# Patient Record
Sex: Male | Born: 1935 | Race: White | Hispanic: No | Smoking: Former smoker
Health system: Southern US, Community
[De-identification: ages and names within clinical notes are randomized; demographics above are authoritative.]

## PROBLEM LIST (undated history)

## (undated) DIAGNOSIS — E785 Hyperlipidemia, unspecified: Secondary | ICD-10-CM

## (undated) DIAGNOSIS — E119 Type 2 diabetes mellitus without complications: Secondary | ICD-10-CM

## (undated) DIAGNOSIS — G629 Polyneuropathy, unspecified: Secondary | ICD-10-CM

## (undated) DIAGNOSIS — I25118 Atherosclerotic heart disease of native coronary artery with other forms of angina pectoris: Secondary | ICD-10-CM

## (undated) DIAGNOSIS — Z972 Presence of dental prosthetic device (complete) (partial): Secondary | ICD-10-CM

## (undated) DIAGNOSIS — Z974 Presence of external hearing-aid: Secondary | ICD-10-CM

## (undated) DIAGNOSIS — I5189 Other ill-defined heart diseases: Secondary | ICD-10-CM

## (undated) DIAGNOSIS — I2510 Atherosclerotic heart disease of native coronary artery without angina pectoris: Secondary | ICD-10-CM

## (undated) DIAGNOSIS — I10 Essential (primary) hypertension: Secondary | ICD-10-CM

## (undated) DIAGNOSIS — C61 Malignant neoplasm of prostate: Secondary | ICD-10-CM

## (undated) HISTORY — DX: Type 2 diabetes mellitus without complications: E11.9

## (undated) HISTORY — DX: Essential (primary) hypertension: I10

## (undated) HISTORY — DX: Hyperlipidemia, unspecified: E78.5

## (undated) HISTORY — PX: COLONOSCOPY: SHX174

## (undated) HISTORY — DX: Malignant neoplasm of prostate: C61

---

## 2006-09-09 ENCOUNTER — Ambulatory Visit: Payer: Self-pay | Admitting: Gastroenterology

## 2011-05-29 DIAGNOSIS — J019 Acute sinusitis, unspecified: Secondary | ICD-10-CM | POA: Diagnosis not present

## 2011-05-29 DIAGNOSIS — R05 Cough: Secondary | ICD-10-CM | POA: Diagnosis not present

## 2011-05-29 DIAGNOSIS — R51 Headache: Secondary | ICD-10-CM | POA: Diagnosis not present

## 2011-05-29 DIAGNOSIS — R911 Solitary pulmonary nodule: Secondary | ICD-10-CM | POA: Diagnosis not present

## 2011-06-26 DIAGNOSIS — C61 Malignant neoplasm of prostate: Secondary | ICD-10-CM | POA: Diagnosis not present

## 2011-06-26 DIAGNOSIS — M9981 Other biomechanical lesions of cervical region: Secondary | ICD-10-CM | POA: Diagnosis not present

## 2011-06-26 DIAGNOSIS — D400 Neoplasm of uncertain behavior of prostate: Secondary | ICD-10-CM | POA: Diagnosis not present

## 2011-06-26 DIAGNOSIS — M5030 Other cervical disc degeneration, unspecified cervical region: Secondary | ICD-10-CM | POA: Diagnosis not present

## 2011-06-26 DIAGNOSIS — N529 Male erectile dysfunction, unspecified: Secondary | ICD-10-CM | POA: Diagnosis not present

## 2011-06-28 DIAGNOSIS — M5030 Other cervical disc degeneration, unspecified cervical region: Secondary | ICD-10-CM | POA: Diagnosis not present

## 2011-06-28 DIAGNOSIS — M9981 Other biomechanical lesions of cervical region: Secondary | ICD-10-CM | POA: Diagnosis not present

## 2011-07-01 DIAGNOSIS — M9981 Other biomechanical lesions of cervical region: Secondary | ICD-10-CM | POA: Diagnosis not present

## 2011-07-01 DIAGNOSIS — M5030 Other cervical disc degeneration, unspecified cervical region: Secondary | ICD-10-CM | POA: Diagnosis not present

## 2011-07-04 DIAGNOSIS — M9981 Other biomechanical lesions of cervical region: Secondary | ICD-10-CM | POA: Diagnosis not present

## 2011-07-04 DIAGNOSIS — M5030 Other cervical disc degeneration, unspecified cervical region: Secondary | ICD-10-CM | POA: Diagnosis not present

## 2011-07-08 DIAGNOSIS — M9981 Other biomechanical lesions of cervical region: Secondary | ICD-10-CM | POA: Diagnosis not present

## 2011-07-08 DIAGNOSIS — M5030 Other cervical disc degeneration, unspecified cervical region: Secondary | ICD-10-CM | POA: Diagnosis not present

## 2011-07-11 DIAGNOSIS — M9981 Other biomechanical lesions of cervical region: Secondary | ICD-10-CM | POA: Diagnosis not present

## 2011-07-11 DIAGNOSIS — M5030 Other cervical disc degeneration, unspecified cervical region: Secondary | ICD-10-CM | POA: Diagnosis not present

## 2012-01-29 DIAGNOSIS — C61 Malignant neoplasm of prostate: Secondary | ICD-10-CM | POA: Diagnosis not present

## 2012-01-29 DIAGNOSIS — I10 Essential (primary) hypertension: Secondary | ICD-10-CM | POA: Diagnosis not present

## 2012-01-29 DIAGNOSIS — E119 Type 2 diabetes mellitus without complications: Secondary | ICD-10-CM | POA: Diagnosis not present

## 2012-01-29 DIAGNOSIS — E785 Hyperlipidemia, unspecified: Secondary | ICD-10-CM | POA: Diagnosis not present

## 2012-01-30 DIAGNOSIS — C61 Malignant neoplasm of prostate: Secondary | ICD-10-CM | POA: Diagnosis not present

## 2012-01-30 DIAGNOSIS — E785 Hyperlipidemia, unspecified: Secondary | ICD-10-CM | POA: Diagnosis not present

## 2012-01-30 DIAGNOSIS — E119 Type 2 diabetes mellitus without complications: Secondary | ICD-10-CM | POA: Diagnosis not present

## 2012-05-29 DIAGNOSIS — E119 Type 2 diabetes mellitus without complications: Secondary | ICD-10-CM | POA: Diagnosis not present

## 2012-05-29 DIAGNOSIS — J329 Chronic sinusitis, unspecified: Secondary | ICD-10-CM | POA: Diagnosis not present

## 2012-06-03 DIAGNOSIS — E7800 Pure hypercholesterolemia, unspecified: Secondary | ICD-10-CM | POA: Diagnosis not present

## 2012-06-03 DIAGNOSIS — I10 Essential (primary) hypertension: Secondary | ICD-10-CM | POA: Diagnosis not present

## 2012-06-03 DIAGNOSIS — Z23 Encounter for immunization: Secondary | ICD-10-CM | POA: Diagnosis not present

## 2012-06-03 DIAGNOSIS — C61 Malignant neoplasm of prostate: Secondary | ICD-10-CM | POA: Diagnosis not present

## 2012-06-03 DIAGNOSIS — E119 Type 2 diabetes mellitus without complications: Secondary | ICD-10-CM | POA: Diagnosis not present

## 2012-06-04 DIAGNOSIS — I10 Essential (primary) hypertension: Secondary | ICD-10-CM | POA: Diagnosis not present

## 2012-06-04 DIAGNOSIS — E7800 Pure hypercholesterolemia, unspecified: Secondary | ICD-10-CM | POA: Diagnosis not present

## 2012-06-04 DIAGNOSIS — E119 Type 2 diabetes mellitus without complications: Secondary | ICD-10-CM | POA: Diagnosis not present

## 2012-07-03 DIAGNOSIS — E1149 Type 2 diabetes mellitus with other diabetic neurological complication: Secondary | ICD-10-CM | POA: Diagnosis not present

## 2012-07-03 DIAGNOSIS — E119 Type 2 diabetes mellitus without complications: Secondary | ICD-10-CM | POA: Diagnosis not present

## 2012-07-03 DIAGNOSIS — I10 Essential (primary) hypertension: Secondary | ICD-10-CM | POA: Diagnosis not present

## 2012-07-03 DIAGNOSIS — E7800 Pure hypercholesterolemia, unspecified: Secondary | ICD-10-CM | POA: Diagnosis not present

## 2012-07-27 DIAGNOSIS — I10 Essential (primary) hypertension: Secondary | ICD-10-CM | POA: Diagnosis not present

## 2012-08-03 DIAGNOSIS — I10 Essential (primary) hypertension: Secondary | ICD-10-CM | POA: Diagnosis not present

## 2012-08-03 DIAGNOSIS — E119 Type 2 diabetes mellitus without complications: Secondary | ICD-10-CM | POA: Diagnosis not present

## 2012-08-19 DIAGNOSIS — R351 Nocturia: Secondary | ICD-10-CM | POA: Diagnosis not present

## 2012-08-19 DIAGNOSIS — D400 Neoplasm of uncertain behavior of prostate: Secondary | ICD-10-CM | POA: Diagnosis not present

## 2012-08-19 DIAGNOSIS — C61 Malignant neoplasm of prostate: Secondary | ICD-10-CM | POA: Diagnosis not present

## 2012-10-25 LAB — HM DIABETES EYE EXAM

## 2012-11-18 DIAGNOSIS — I10 Essential (primary) hypertension: Secondary | ICD-10-CM | POA: Diagnosis not present

## 2012-11-18 DIAGNOSIS — E7800 Pure hypercholesterolemia, unspecified: Secondary | ICD-10-CM | POA: Diagnosis not present

## 2012-11-18 DIAGNOSIS — E119 Type 2 diabetes mellitus without complications: Secondary | ICD-10-CM | POA: Diagnosis not present

## 2012-11-18 DIAGNOSIS — E1149 Type 2 diabetes mellitus with other diabetic neurological complication: Secondary | ICD-10-CM | POA: Diagnosis not present

## 2012-12-17 DIAGNOSIS — E119 Type 2 diabetes mellitus without complications: Secondary | ICD-10-CM | POA: Diagnosis not present

## 2012-12-17 DIAGNOSIS — E7800 Pure hypercholesterolemia, unspecified: Secondary | ICD-10-CM | POA: Diagnosis not present

## 2012-12-17 DIAGNOSIS — I10 Essential (primary) hypertension: Secondary | ICD-10-CM | POA: Diagnosis not present

## 2012-12-17 DIAGNOSIS — E1149 Type 2 diabetes mellitus with other diabetic neurological complication: Secondary | ICD-10-CM | POA: Diagnosis not present

## 2013-02-17 DIAGNOSIS — E1142 Type 2 diabetes mellitus with diabetic polyneuropathy: Secondary | ICD-10-CM | POA: Diagnosis not present

## 2013-02-17 DIAGNOSIS — Z23 Encounter for immunization: Secondary | ICD-10-CM | POA: Diagnosis not present

## 2013-02-17 DIAGNOSIS — E1149 Type 2 diabetes mellitus with other diabetic neurological complication: Secondary | ICD-10-CM | POA: Diagnosis not present

## 2013-02-17 DIAGNOSIS — E119 Type 2 diabetes mellitus without complications: Secondary | ICD-10-CM | POA: Diagnosis not present

## 2013-02-24 DIAGNOSIS — E119 Type 2 diabetes mellitus without complications: Secondary | ICD-10-CM | POA: Diagnosis not present

## 2013-02-24 DIAGNOSIS — E7800 Pure hypercholesterolemia, unspecified: Secondary | ICD-10-CM | POA: Diagnosis not present

## 2013-05-07 DIAGNOSIS — E1149 Type 2 diabetes mellitus with other diabetic neurological complication: Secondary | ICD-10-CM | POA: Diagnosis not present

## 2013-05-07 DIAGNOSIS — E7800 Pure hypercholesterolemia, unspecified: Secondary | ICD-10-CM | POA: Diagnosis not present

## 2013-05-07 DIAGNOSIS — E1142 Type 2 diabetes mellitus with diabetic polyneuropathy: Secondary | ICD-10-CM | POA: Diagnosis not present

## 2013-05-07 DIAGNOSIS — I10 Essential (primary) hypertension: Secondary | ICD-10-CM | POA: Diagnosis not present

## 2013-05-07 DIAGNOSIS — Z23 Encounter for immunization: Secondary | ICD-10-CM | POA: Diagnosis not present

## 2013-05-10 DIAGNOSIS — IMO0002 Reserved for concepts with insufficient information to code with codable children: Secondary | ICD-10-CM | POA: Diagnosis not present

## 2013-06-25 DIAGNOSIS — E1142 Type 2 diabetes mellitus with diabetic polyneuropathy: Secondary | ICD-10-CM | POA: Diagnosis not present

## 2013-06-25 DIAGNOSIS — I10 Essential (primary) hypertension: Secondary | ICD-10-CM | POA: Diagnosis not present

## 2013-06-25 DIAGNOSIS — E1149 Type 2 diabetes mellitus with other diabetic neurological complication: Secondary | ICD-10-CM | POA: Diagnosis not present

## 2013-06-25 DIAGNOSIS — J209 Acute bronchitis, unspecified: Secondary | ICD-10-CM | POA: Diagnosis not present

## 2013-08-06 DIAGNOSIS — I10 Essential (primary) hypertension: Secondary | ICD-10-CM | POA: Diagnosis not present

## 2013-08-06 DIAGNOSIS — E119 Type 2 diabetes mellitus without complications: Secondary | ICD-10-CM | POA: Diagnosis not present

## 2013-08-06 DIAGNOSIS — E1149 Type 2 diabetes mellitus with other diabetic neurological complication: Secondary | ICD-10-CM | POA: Diagnosis not present

## 2013-08-06 DIAGNOSIS — E1142 Type 2 diabetes mellitus with diabetic polyneuropathy: Secondary | ICD-10-CM | POA: Diagnosis not present

## 2013-08-17 DIAGNOSIS — N529 Male erectile dysfunction, unspecified: Secondary | ICD-10-CM | POA: Diagnosis not present

## 2013-08-17 DIAGNOSIS — C61 Malignant neoplasm of prostate: Secondary | ICD-10-CM | POA: Diagnosis not present

## 2013-08-17 DIAGNOSIS — D400 Neoplasm of uncertain behavior of prostate: Secondary | ICD-10-CM | POA: Diagnosis not present

## 2013-09-13 DIAGNOSIS — I10 Essential (primary) hypertension: Secondary | ICD-10-CM | POA: Diagnosis not present

## 2013-09-13 DIAGNOSIS — E119 Type 2 diabetes mellitus without complications: Secondary | ICD-10-CM | POA: Diagnosis not present

## 2013-09-13 DIAGNOSIS — J3489 Other specified disorders of nose and nasal sinuses: Secondary | ICD-10-CM | POA: Diagnosis not present

## 2013-09-13 DIAGNOSIS — E1149 Type 2 diabetes mellitus with other diabetic neurological complication: Secondary | ICD-10-CM | POA: Diagnosis not present

## 2013-11-16 DIAGNOSIS — E1149 Type 2 diabetes mellitus with other diabetic neurological complication: Secondary | ICD-10-CM | POA: Diagnosis not present

## 2013-11-16 DIAGNOSIS — I10 Essential (primary) hypertension: Secondary | ICD-10-CM | POA: Diagnosis not present

## 2013-11-16 DIAGNOSIS — E7800 Pure hypercholesterolemia, unspecified: Secondary | ICD-10-CM | POA: Diagnosis not present

## 2013-11-16 DIAGNOSIS — E119 Type 2 diabetes mellitus without complications: Secondary | ICD-10-CM | POA: Diagnosis not present

## 2014-03-01 DIAGNOSIS — Z23 Encounter for immunization: Secondary | ICD-10-CM | POA: Diagnosis not present

## 2014-03-09 DIAGNOSIS — E119 Type 2 diabetes mellitus without complications: Secondary | ICD-10-CM | POA: Diagnosis not present

## 2014-03-09 DIAGNOSIS — E780 Pure hypercholesterolemia: Secondary | ICD-10-CM | POA: Diagnosis not present

## 2014-03-09 DIAGNOSIS — E1140 Type 2 diabetes mellitus with diabetic neuropathy, unspecified: Secondary | ICD-10-CM | POA: Diagnosis not present

## 2014-03-22 DIAGNOSIS — E119 Type 2 diabetes mellitus without complications: Secondary | ICD-10-CM | POA: Diagnosis not present

## 2014-03-22 DIAGNOSIS — E780 Pure hypercholesterolemia: Secondary | ICD-10-CM | POA: Diagnosis not present

## 2014-03-22 DIAGNOSIS — E1143 Type 2 diabetes mellitus with diabetic autonomic (poly)neuropathy: Secondary | ICD-10-CM | POA: Diagnosis not present

## 2014-03-22 DIAGNOSIS — I10 Essential (primary) hypertension: Secondary | ICD-10-CM | POA: Diagnosis not present

## 2014-04-20 DIAGNOSIS — J329 Chronic sinusitis, unspecified: Secondary | ICD-10-CM | POA: Diagnosis not present

## 2014-04-27 DIAGNOSIS — R110 Nausea: Secondary | ICD-10-CM | POA: Diagnosis not present

## 2014-04-27 DIAGNOSIS — K521 Toxic gastroenteritis and colitis: Secondary | ICD-10-CM | POA: Diagnosis not present

## 2014-04-27 DIAGNOSIS — J329 Chronic sinusitis, unspecified: Secondary | ICD-10-CM | POA: Diagnosis not present

## 2014-06-24 DIAGNOSIS — Z1389 Encounter for screening for other disorder: Secondary | ICD-10-CM | POA: Diagnosis not present

## 2014-06-24 DIAGNOSIS — Z23 Encounter for immunization: Secondary | ICD-10-CM | POA: Diagnosis not present

## 2014-06-24 DIAGNOSIS — E119 Type 2 diabetes mellitus without complications: Secondary | ICD-10-CM | POA: Diagnosis not present

## 2014-06-24 DIAGNOSIS — E780 Pure hypercholesterolemia: Secondary | ICD-10-CM | POA: Diagnosis not present

## 2014-06-24 DIAGNOSIS — E1142 Type 2 diabetes mellitus with diabetic polyneuropathy: Secondary | ICD-10-CM | POA: Diagnosis not present

## 2014-06-24 DIAGNOSIS — I10 Essential (primary) hypertension: Secondary | ICD-10-CM | POA: Diagnosis not present

## 2014-06-24 DIAGNOSIS — Z9181 History of falling: Secondary | ICD-10-CM | POA: Diagnosis not present

## 2014-06-24 DIAGNOSIS — R0981 Nasal congestion: Secondary | ICD-10-CM | POA: Diagnosis not present

## 2014-08-01 DIAGNOSIS — E780 Pure hypercholesterolemia: Secondary | ICD-10-CM | POA: Diagnosis not present

## 2014-08-01 DIAGNOSIS — I10 Essential (primary) hypertension: Secondary | ICD-10-CM | POA: Diagnosis not present

## 2014-08-01 DIAGNOSIS — J309 Allergic rhinitis, unspecified: Secondary | ICD-10-CM | POA: Diagnosis not present

## 2014-08-01 DIAGNOSIS — E119 Type 2 diabetes mellitus without complications: Secondary | ICD-10-CM | POA: Diagnosis not present

## 2014-08-01 DIAGNOSIS — E1143 Type 2 diabetes mellitus with diabetic autonomic (poly)neuropathy: Secondary | ICD-10-CM | POA: Diagnosis not present

## 2014-10-17 DIAGNOSIS — M1990 Unspecified osteoarthritis, unspecified site: Secondary | ICD-10-CM | POA: Diagnosis not present

## 2014-10-17 DIAGNOSIS — E780 Pure hypercholesterolemia: Secondary | ICD-10-CM | POA: Diagnosis not present

## 2014-10-17 DIAGNOSIS — I10 Essential (primary) hypertension: Secondary | ICD-10-CM | POA: Diagnosis not present

## 2014-10-17 DIAGNOSIS — E119 Type 2 diabetes mellitus without complications: Secondary | ICD-10-CM | POA: Diagnosis not present

## 2014-10-17 DIAGNOSIS — E1142 Type 2 diabetes mellitus with diabetic polyneuropathy: Secondary | ICD-10-CM | POA: Diagnosis not present

## 2014-12-26 DIAGNOSIS — H35373 Puckering of macula, bilateral: Secondary | ICD-10-CM | POA: Diagnosis not present

## 2014-12-26 DIAGNOSIS — H2513 Age-related nuclear cataract, bilateral: Secondary | ICD-10-CM | POA: Diagnosis not present

## 2015-01-19 ENCOUNTER — Encounter: Payer: Self-pay | Admitting: Family Medicine

## 2015-01-19 ENCOUNTER — Ambulatory Visit (INDEPENDENT_AMBULATORY_CARE_PROVIDER_SITE_OTHER): Payer: Medicare Other | Admitting: Family Medicine

## 2015-01-19 ENCOUNTER — Other Ambulatory Visit: Payer: Self-pay | Admitting: *Deleted

## 2015-01-19 VITALS — BP 131/71 | HR 59 | Temp 98.1°F | Resp 16 | Ht 70.0 in | Wt 170.8 lb

## 2015-01-19 DIAGNOSIS — E1169 Type 2 diabetes mellitus with other specified complication: Secondary | ICD-10-CM | POA: Diagnosis not present

## 2015-01-19 DIAGNOSIS — Z8546 Personal history of malignant neoplasm of prostate: Secondary | ICD-10-CM | POA: Insufficient documentation

## 2015-01-19 DIAGNOSIS — R351 Nocturia: Secondary | ICD-10-CM | POA: Diagnosis not present

## 2015-01-19 DIAGNOSIS — N183 Chronic kidney disease, stage 3 (moderate): Secondary | ICD-10-CM

## 2015-01-19 DIAGNOSIS — D485 Neoplasm of uncertain behavior of skin: Secondary | ICD-10-CM | POA: Diagnosis not present

## 2015-01-19 DIAGNOSIS — I129 Hypertensive chronic kidney disease with stage 1 through stage 4 chronic kidney disease, or unspecified chronic kidney disease: Secondary | ICD-10-CM | POA: Diagnosis not present

## 2015-01-19 DIAGNOSIS — E785 Hyperlipidemia, unspecified: Secondary | ICD-10-CM

## 2015-01-19 DIAGNOSIS — E1142 Type 2 diabetes mellitus with diabetic polyneuropathy: Secondary | ICD-10-CM | POA: Diagnosis not present

## 2015-01-19 LAB — POCT GLYCOSYLATED HEMOGLOBIN (HGB A1C): Hemoglobin A1C: 6.5

## 2015-01-19 MED ORDER — CAPSAICIN 0.025 % EX CREA: TOPICAL_CREAM | Freq: Two times a day (BID) | CUTANEOUS | Status: DC

## 2015-01-19 MED ORDER — ALPHA-LIPOIC ACID 600 MG PO CAPS: 1.0000 | ORAL_CAPSULE | Freq: Every day | ORAL | Status: DC

## 2015-01-19 NOTE — Progress Notes (Signed)
Subjective:    Patient ID: [name], male    DOB: 01-22-1934, 81 y.o.   MRN: [id number]  HPI: [name] is a 81 y.o. male presenting on 01/19/2015 for Diabetes; Hypertension; and Hyperlipidemia   Diabetes He has type 2 diabetes mellitus. His disease course has been stable. There are no hypoglycemic associated symptoms. Pertinent negatives for hypoglycemia include no headaches. Pertinent negatives for diabetes include no blurred vision, no chest pain, no fatigue, no foot paresthesias, no polydipsia, no polyphagia, no polyuria and no weakness. Diabetic complications include peripheral neuropathy. Current diabetic treatment includes oral agent (monotherapy) and diet. His weight is stable. His overall blood glucose range is 110-130 mg/dl.  Hypertension This is a chronic problem. The current episode started more than 1 year ago. The problem has been gradually improving since onset. The problem is controlled. Pertinent negatives include no blurred vision, chest pain, headaches, palpitations, peripheral edema or shortness of breath. Risk factors for coronary artery disease include diabetes mellitus. Past treatments include ACE inhibitors. The current treatment provides moderate improvement.  Hyperlipidemia This is a chronic problem. Recent lipid tests were reviewed and are high. Exacerbating diseases include diabetes. Pertinent negatives include no chest pain or shortness of breath. Current antihyperlipidemic treatment includes ezetimibe.    Pt also has concerns about his prostate. Treated for prostate cancer several years ago. He has noticed some nocturia and hesitancy. He has previously Dr. Yves Dill for neurology.  Diabetic peripheral neuropathy- toes numb all the day.  Pt reports he feels he doesn't walk as good with numbness in toes. Gabapentin has not made an appreciable difference Pt had eye exam with Dr. Ellin Mayhew on June- no retinopathy but cataracts. He will see Dr. Thomasene Ripple in  October for cataract surgery.   Past Medical History  Diagnosis Date  . Diabetes mellitus without complication   . Hyperlipidemia   . Hypertension   . Prostate cancer     No current outpatient prescriptions on file prior to visit.   No current facility-administered medications on file prior to visit.    Review of Systems  Constitutional: Negative for fever, chills and fatigue.  HENT: Negative.   Eyes: Negative for blurred vision and visual disturbance.  Respiratory: Negative for chest tightness, shortness of breath and wheezing.   Cardiovascular: Negative for chest pain and palpitations.  Gastrointestinal: Negative.   Endocrine: Negative for cold intolerance, heat intolerance, polydipsia, polyphagia and polyuria.  Genitourinary: Positive for difficulty urinating.  Neurological: Negative for weakness, light-headedness, numbness and headaches.  Psychiatric/Behavioral: Negative.    Per HPI unless specifically indicated above     Objective:    BP 131/71 mmHg  Pulse 59  Temp(Src) 98.1 F (36.7 C) (Oral)  Resp 16  Ht 5\' 10"  (1.778 m)  Wt 170 lb 12.8 oz (77.474 kg)  BMI 24.51 kg/m2  Wt Readings from Last 3 Encounters:  01/19/15 170 lb 12.8 oz (77.474 kg)    Physical Exam  Constitutional: He is oriented to person, place, and time. He appears well-developed and well-nourished. No distress.  Neck: Normal range of motion. Neck supple. No thyromegaly present.  Cardiovascular: Normal rate and regular rhythm.  Exam reveals no gallop and no friction rub.   No murmur heard. Pulmonary/Chest: Effort normal and breath sounds normal.  Abdominal: Soft. Bowel sounds are normal. There is no tenderness. There is no rebound.  Musculoskeletal: Normal range of motion. He exhibits no edema or tenderness.  Lymphadenopathy:    He has no cervical adenopathy.  Neurological:  He is alert and oriented to person, place, and time.  Skin: Skin is warm and dry. He is not diaphoretic.   Diabetic Foot  Exam - Simple   Simple Foot Form  Diabetic Foot exam was performed with the following findings:  Yes 01/19/2015  9:22 AM  Visual Inspection  No deformities, no ulcerations, no other skin breakdown bilaterally:  Yes  Sensation Testing  See comments:  Yes  Pulse Check  Posterior Tibialis and Dorsalis pulse intact bilaterally:  Yes  Comments  Diminished sensation bilaterally to monofilament on toes 3-5.      Results for orders placed or performed in visit on 01/19/15  POCT HgB A1C  Result Value Ref Range   Hemoglobin A1C 6.5   HM DIABETES EYE EXAM  Result Value Ref Range   HM Diabetic Eye Exam No Retinopathy No Retinopathy      Assessment & Plan:   Problem List Items Addressed This Visit      Cardiovascular and Mediastinum   Benign hypertension with CKD (chronic kidney disease) stage III    BP well controlled. CMP to check kidney function. DASH diet reviewed. Alarm symptoms reviewed.  ACE for BP control.         Endocrine   Type 2 diabetes mellitus with diabetic polyneuropathy - Primary    Controlled. A1c improved to 6.5%. Overall doing well. Continues to make diet and lifestyle changes to help control diabetes. ACE for renal protection. Diabetic eye exam current as of June 2016. Foot exam done.       Relevant Orders   POCT HgB A1C (Completed)   Comprehensive Metabolic Panel (CMET)     Other   Hyperlipidemia associated with type 2 diabetes mellitus    Does not tolerate statins. Is taking Red Yeast rice supplements occasionally. Also taking Zetia. Check lipid panel today to determine cholesterol control.      Relevant Orders   Lipid Profile   Personal history of prostate cancer   Nocturia    Possibly related to prostate over growth. Pt is unsure if he needs to see urologist again to have it checked given history of prostate cancer. Pt offered referral to urology due to insurance needs, pt will decide and let the clinic know.        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 Possible actinic keratosis or benign skin lesion. Pt would like it removed. Refer to dermatology.    Relevant Orders    Ambulatory referral to Dermatology       Meds ordered this encounter  Medications  . Alpha-Lipoic Acid 600 MG CAPS    Sig: Take 1 capsule (600 mg total) by mouth daily.    Dispense:  30 each    Order Specific Question:  Supervising Provider    Answer:  Arlis Porta [phone]  . capsaicin (ZOSTRIX) 0.025 % cream    Sig: Apply topically 2 (two) times daily.    Dispense:  60 g    Refill:  0    Order Specific Question:  Supervising Provider    Answer:  Arlis Porta [[id number]]      Follow up plan: Return in about 3 months (around 04/21/2015).

## 2015-01-19 NOTE — Assessment & Plan Note (Signed)
Possibly related to prostate over growth. Pt is unsure if he needs to see urologist again to have it checked given history of prostate cancer. Pt offered referral to urology due to insurance needs, pt will decide and let the clinic know.

## 2015-01-19 NOTE — Assessment & Plan Note (Signed)
BP well controlled. CMP to check kidney function. DASH diet reviewed. Alarm symptoms reviewed.  ACE for BP control.

## 2015-01-19 NOTE — Assessment & Plan Note (Signed)
Does not tolerate statins. Is taking Red Yeast rice supplements occasionally. Also taking Zetia. Check lipid panel today to determine cholesterol control.

## 2015-01-19 NOTE — Patient Instructions (Signed)
Diabetes: GREAT JOB on your A1c!  Keep up the good work.  As far as your neuropathy goes you can try Alpha Lipoic Acid (OTC) supplement once daily or rub capsaicin cream on the toes.  If you want to stop the gabapentin please let us know and we can show you how to do it.

## 2015-01-19 NOTE — Assessment & Plan Note (Signed)
Controlled. A1c improved to 6.5%. Overall doing well. Continues to make diet and lifestyle changes to help control diabetes. ACE for renal protection. Diabetic eye exam current as of June 2016. Foot exam done.

## 2015-01-25 ENCOUNTER — Encounter: Payer: Self-pay | Admitting: *Deleted

## 2015-01-27 DIAGNOSIS — E1142 Type 2 diabetes mellitus with diabetic polyneuropathy: Secondary | ICD-10-CM | POA: Diagnosis not present

## 2015-01-27 DIAGNOSIS — E785 Hyperlipidemia, unspecified: Secondary | ICD-10-CM | POA: Diagnosis not present

## 2015-01-27 DIAGNOSIS — E1169 Type 2 diabetes mellitus with other specified complication: Secondary | ICD-10-CM | POA: Diagnosis not present

## 2015-01-28 LAB — COMPREHENSIVE METABOLIC PANEL
ALBUMIN: 4.5 g/dL (ref 3.5–4.8)
ALT: 13 IU/L (ref 0–44)
AST: 20 IU/L (ref 0–40)
Albumin/Globulin Ratio: 1.7 (ref 1.1–2.5)
Alkaline Phosphatase: 49 IU/L (ref 39–117)
BILIRUBIN TOTAL: 0.3 mg/dL (ref 0.0–1.2)
BUN / CREAT RATIO: 22 (ref 10–22)
BUN: 23 mg/dL (ref 8–27)
CHLORIDE: 94 mmol/L — AB (ref 97–108)
CO2: 25 mmol/L (ref 18–29)
CREATININE: 1.04 mg/dL (ref 0.76–1.27)
Calcium: 9.7 mg/dL (ref 8.6–10.2)
GFR calc non Af Amer: 68 mL/min/{1.73_m2} (ref >59)
GFR, EST AFRICAN AMERICAN: 79 mL/min/{1.73_m2} (ref >59)
GLUCOSE: 182 mg/dL — AB (ref 65–99)
Globulin, Total: 2.6 g/dL (ref 1.5–4.5)
Potassium: 4.6 mmol/L (ref 3.5–5.2)
Sodium: 139 mmol/L (ref 134–144)
TOTAL PROTEIN: 7.1 g/dL (ref 6.0–8.5)

## 2015-01-28 LAB — LIPID PANEL
CHOLESTEROL TOTAL: 168 mg/dL (ref 100–199)
Chol/HDL Ratio: 6.5 ratio units — ABNORMAL HIGH (ref 0.0–5.0)
HDL: 26 mg/dL — AB (ref >39)
Triglycerides: 703 mg/dL (ref 0–149)

## 2015-02-24 DIAGNOSIS — H2513 Age-related nuclear cataract, bilateral: Secondary | ICD-10-CM | POA: Diagnosis not present

## 2015-02-24 DIAGNOSIS — H35373 Puckering of macula, bilateral: Secondary | ICD-10-CM | POA: Diagnosis not present

## 2015-02-27 DIAGNOSIS — H35373 Puckering of macula, bilateral: Secondary | ICD-10-CM | POA: Diagnosis not present

## 2015-04-05 DIAGNOSIS — Z23 Encounter for immunization: Secondary | ICD-10-CM | POA: Diagnosis not present

## 2015-04-18 ENCOUNTER — Encounter: Payer: Self-pay | Admitting: Family Medicine

## 2015-04-18 ENCOUNTER — Ambulatory Visit: Payer: Medicare Other | Admitting: Family Medicine

## 2015-04-18 ENCOUNTER — Ambulatory Visit (INDEPENDENT_AMBULATORY_CARE_PROVIDER_SITE_OTHER): Payer: Medicare Other | Admitting: Family Medicine

## 2015-04-18 VITALS — BP 130/70 | HR 66 | Temp 97.7°F | Resp 16 | Ht 70.0 in | Wt 169.0 lb

## 2015-04-18 DIAGNOSIS — H2513 Age-related nuclear cataract, bilateral: Secondary | ICD-10-CM | POA: Diagnosis not present

## 2015-04-18 DIAGNOSIS — Z8546 Personal history of malignant neoplasm of prostate: Secondary | ICD-10-CM

## 2015-04-18 DIAGNOSIS — E1142 Type 2 diabetes mellitus with diabetic polyneuropathy: Secondary | ICD-10-CM

## 2015-04-18 DIAGNOSIS — E1169 Type 2 diabetes mellitus with other specified complication: Secondary | ICD-10-CM | POA: Diagnosis not present

## 2015-04-18 DIAGNOSIS — I129 Hypertensive chronic kidney disease with stage 1 through stage 4 chronic kidney disease, or unspecified chronic kidney disease: Secondary | ICD-10-CM

## 2015-04-18 DIAGNOSIS — N183 Chronic kidney disease, stage 3 (moderate): Secondary | ICD-10-CM | POA: Diagnosis not present

## 2015-04-18 DIAGNOSIS — E785 Hyperlipidemia, unspecified: Secondary | ICD-10-CM | POA: Diagnosis not present

## 2015-04-18 LAB — POCT GLYCOSYLATED HEMOGLOBIN (HGB A1C): Hemoglobin A1C: 6.6

## 2015-04-18 MED ORDER — GABAPENTIN 600 MG PO TABS: 600.0000 mg | ORAL_TABLET | Freq: Three times a day (TID) | ORAL | Status: DC

## 2015-04-18 NOTE — Progress Notes (Signed)
Name: [name]   MRN: [id number]    DOB: [birth year]   Date:04/18/2015       Progress Note  Subjective  Chief Complaint  Chief Complaint  Patient presents with  . Diabetes    HPI  Here for f/u of DM and HBP.  BSs at home run about 140 or so.  Diabetic neuropathy of feet still bother him a lot.  He is having cataract eye surgery next week (mon.).  No problem-specific assessment & plan notes found for this encounter.   Past Medical History  Diagnosis Date  . Diabetes mellitus without complication ([place])   . Hyperlipidemia   . Hypertension   . Prostate cancer [facility])     History reviewed. No pertinent past surgical history.  Family History  Problem Relation Age of Onset  . Stroke Mother   . Cancer Mother     colon cancer  . COPD Father     Social History   Social History  . Marital Status: Widowed    Spouse Name: N/A  . Number of Children: N/A  . Years of Education: N/A   Occupational History  . Not on file.   Social History Main Topics  . Smoking status: Former Smoker    Quit date: 05/27/1978  . Smokeless tobacco: Not on file  . Alcohol Use: No  . Drug Use: No  . Sexual Activity: Not on file   Other Topics Concern  . Not on file   Social History Narrative     Current outpatient prescriptions:  .  Alpha-Lipoic Acid 600 MG CAPS, Take 1 capsule (600 mg total) by mouth daily., Disp: 30 each, Rfl:  .  capsaicin (ZOSTRIX) 0.025 % cream, Apply topically 2 (two) times daily., Disp: 60 g, Rfl: 0 .  chlorhexidine (PERIDEX) 0.12 % solution, 1 mL by Mouth Rinse route as directed., Disp: , Rfl:  .  lisinopril (PRINIVIL,ZESTRIL) 10 MG tablet, Take 10 mg by mouth daily., Disp: , Rfl:  .  metFORMIN (GLUCOPHAGE-XR) 500 MG 24 hr tablet, Take 500 mg by mouth daily., Disp: , Rfl:  .  VOLTAREN 1 % GEL, Apply 1 g topically as needed., Disp: , Rfl:  .  ZETIA 10 MG tablet, Take 10 mg by mouth daily., Disp: , Rfl:  .  DUREZOL 0.05 % EMUL, Place [id number] application into the  left eye as needed., Disp: , Rfl:  .  gabapentin (NEURONTIN) 600 MG tablet, Take 1 tablet (600 mg total) by mouth 3 (three) times daily., Disp: 90 tablet, Rfl: 12 .  ILEVRO 0.3 % ophthalmic suspension, Place 0.3 application into the left eye as directed., Disp: , Rfl:   No Known Allergies   Review of Systems  Constitutional: Negative for fever, chills, weight loss and malaise/fatigue.  HENT: Negative for hearing loss.   Eyes: Positive for blurred vision. Negative for double vision.  Respiratory: Negative for cough, sputum production, shortness of breath and wheezing.   Cardiovascular: Negative for chest pain, palpitations and leg swelling.  Gastrointestinal: Negative for heartburn, abdominal pain and blood in stool.  Genitourinary: Negative for dysuria, urgency and frequency.  Musculoskeletal: Negative for myalgias, back pain, joint pain and neck pain.  Skin: Negative for rash.  Neurological: Negative for dizziness, tremors, weakness and headaches.      Objective  Filed Vitals:   04/18/15 0938 04/18/15 0959  BP: 144/76 130/70  Pulse: 66   Temp: 97.7 F (36.5 C)   TempSrc: Oral   Resp: 16   Height:  5\' 10"  (1.778 m)   Weight: 169 lb (76.658 kg)     Physical Exam  Constitutional: He is oriented to person, place, and time and well-developed, well-nourished, and in no distress. No distress.  HENT:  Head: Normocephalic and atraumatic.  Eyes: Conjunctivae and EOM are normal. Pupils are equal, round, and reactive to light. No scleral icterus.  Neck: Normal range of motion. Neck supple. Carotid bruit is not present. No thyromegaly present.  Cardiovascular: Normal rate, regular rhythm and normal heart sounds.  Exam reveals no gallop and no friction rub.   No murmur heard. Pulmonary/Chest: Effort normal and breath sounds normal. No respiratory distress. He has no wheezes. He has no rales.  Abdominal: Soft. Bowel sounds are normal. He exhibits no distension and no abdominal bruit.  There is no tenderness. There is no rebound.  Musculoskeletal: He exhibits no edema.  Lymphadenopathy:    He has no cervical adenopathy.  Neurological: He is alert and oriented to person, place, and time.  Vitals reviewed.      Recent Results (from the past 2160 hour(s))  POCT HgB A1C     Status: Normal   Collection Time: 01/19/15  9:21 AM  Result Value Ref Range   Hemoglobin A1C 6.5   Comprehensive Metabolic Panel (CMET)     Status: Abnormal   Collection Time: 01/27/15  3:03 PM  Result Value Ref Range   Glucose 182 (H) 65 - 99 mg/dL    Comment: The specimen was lipemic.  The lipemia was cleared by ultracentrifugation before testing.  However HDL, direct LDL, cholesterol and triglyceride (if ordered) were performed prior to ultracentrifugation.    BUN 23 8 - 27 mg/dL   Creatinine, Ser 1.04 0.76 - 1.27 mg/dL   GFR calc non Af Amer 68 >59 mL/min/1.73   GFR calc Af Amer 79 >59 mL/min/1.73   BUN/Creatinine Ratio 22 10 - 22   Sodium 139 134 - 144 mmol/L   Potassium 4.6 3.5 - 5.2 mmol/L   Chloride 94 (L) 97 - 108 mmol/L   CO2 25 18 - 29 mmol/L   Calcium 9.7 8.6 - 10.2 mg/dL   Total Protein 7.1 6.0 - 8.5 g/dL   Albumin 4.5 3.5 - 4.8 g/dL   Globulin, Total 2.6 1.5 - 4.5 g/dL   Albumin/Globulin Ratio 1.7 1.1 - 2.5   Bilirubin Total 0.3 0.0 - 1.2 mg/dL   Alkaline Phosphatase 49 39 - 117 IU/L   AST 20 0 - 40 IU/L   ALT 13 0 - 44 IU/L  Lipid Profile     Status: Abnormal   Collection Time: 01/27/15  3:03 PM  Result Value Ref Range   Cholesterol, Total 168 100 - 199 mg/dL   Triglycerides 703 (HH) 0 - 149 mg/dL   HDL 26 (L) >39 mg/dL    Comment: According to ATP-III Guidelines, HDL-C >59 mg/dL is considered a negative risk factor for CHD.    VLDL Cholesterol Cal Comment 5 - 40 mg/dL    Comment: The calculation for the VLDL cholesterol is not valid when triglyceride level is >400 mg/dL.    LDL Calculated Comment 0 - 99 mg/dL    Comment: Triglyceride result indicated is too  high for an accurate LDL cholesterol estimation.    Chol/HDL Ratio 6.5 (H) 0.0 - 5.0 ratio units    Comment:  T. Chol/HDL Ratio                                             Men  Women                               1/2 Avg.Risk  3.4    3.3                                   Avg.Risk  5.0    4.4                                2X Avg.Risk  9.6    7.1                                3X Avg.Risk 23.4   11.0   POCT HgB A1C     Status: Abnormal   Collection Time: 04/18/15  9:43 AM  Result Value Ref Range   Hemoglobin A1C 6.6 %      Assessment & Plan  Problem List Items Addressed This Visit      Cardiovascular and Mediastinum   Benign hypertension with CKD (chronic kidney disease) stage III - Primary     Endocrine   Type 2 diabetes mellitus with diabetic polyneuropathy (HCC)   Relevant Medications   gabapentin (NEURONTIN) 600 MG tablet   Other Relevant Orders   POCT HgB A1C (Completed)     Other   Hyperlipidemia associated with type 2 diabetes mellitus ([place])   Personal history of prostate cancer      Meds ordered this encounter  Medications  . DUREZOL 0.05 % EMUL    Sig: Place [id number] application into the left eye as needed.  . ILEVRO 0.3 % ophthalmic suspension    Sig: Place 0.3 application into the left eye as directed.  . gabapentin (NEURONTIN) 600 MG tablet    Sig: Take 1 tablet (600 mg total) by mouth 3 (three) times daily.    Dispense:  90 tablet    Refill:  12   1. Type 2 diabetes mellitus with diabetic polyneuropathy, without long-term current use of insulin (HCC) -cont  Metformin - POCT HgB A1C -6.6 - gabapentin (NEURONTIN) 600 MG tablet; Take 1 tablet (600 mg total) by mouth 3 (three) times daily.  Dispense: 90 tablet; Refill: 12  2. Benign hypertension with CKD (chronic kidney disease) stage III  _cont. Lisinopril 3. Hyperlipidemia associated with type 2 diabetes mellitus ([place])   4. Personal history of prostate cancer

## 2015-04-18 NOTE — Patient Instructions (Signed)
Watch diet and lose some weight for better sugar control.

## 2015-04-19 ENCOUNTER — Encounter: Payer: Self-pay | Admitting: *Deleted

## 2015-04-24 ENCOUNTER — Ambulatory Visit: Payer: Medicare Other | Admitting: Anesthesiology

## 2015-04-24 ENCOUNTER — Encounter
Admission: RE | Disposition: A | Discharge status: Home / Self Care | Payer: Self-pay | Source: Ambulatory Visit | Attending: Ophthalmology

## 2015-04-24 ENCOUNTER — Ambulatory Visit
Admission: RE | Admit: 2015-04-24 | Discharge: 2015-04-24 | Disposition: A | Discharge status: Home / Self Care | Payer: Medicare Other | Source: Ambulatory Visit | Attending: Ophthalmology | Admitting: Ophthalmology

## 2015-04-24 ENCOUNTER — Encounter: Payer: Self-pay | Admitting: *Deleted

## 2015-04-24 ENCOUNTER — Ambulatory Visit: Payer: Medicare Other | Admitting: Family Medicine

## 2015-04-24 DIAGNOSIS — H2513 Age-related nuclear cataract, bilateral: Secondary | ICD-10-CM | POA: Diagnosis not present

## 2015-04-24 DIAGNOSIS — H2512 Age-related nuclear cataract, left eye: Secondary | ICD-10-CM | POA: Insufficient documentation

## 2015-04-24 DIAGNOSIS — E7800 Pure hypercholesterolemia, unspecified: Secondary | ICD-10-CM | POA: Diagnosis not present

## 2015-04-24 DIAGNOSIS — Z791 Long term (current) use of non-steroidal anti-inflammatories (NSAID): Secondary | ICD-10-CM | POA: Insufficient documentation

## 2015-04-24 DIAGNOSIS — Z8546 Personal history of malignant neoplasm of prostate: Secondary | ICD-10-CM | POA: Diagnosis not present

## 2015-04-24 DIAGNOSIS — H269 Unspecified cataract: Secondary | ICD-10-CM | POA: Diagnosis present

## 2015-04-24 DIAGNOSIS — E1140 Type 2 diabetes mellitus with diabetic neuropathy, unspecified: Secondary | ICD-10-CM | POA: Diagnosis not present

## 2015-04-24 DIAGNOSIS — Z79899 Other long term (current) drug therapy: Secondary | ICD-10-CM | POA: Diagnosis not present

## 2015-04-24 DIAGNOSIS — E785 Hyperlipidemia, unspecified: Secondary | ICD-10-CM | POA: Diagnosis not present

## 2015-04-24 DIAGNOSIS — I10 Essential (primary) hypertension: Secondary | ICD-10-CM | POA: Diagnosis not present

## 2015-04-24 HISTORY — DX: Polyneuropathy, unspecified: G62.9

## 2015-04-24 HISTORY — PX: CATARACT EXTRACTION W/PHACO: SHX586

## 2015-04-24 LAB — GLUCOSE, CAPILLARY: GLUCOSE-CAPILLARY: 141 mg/dL — AB (ref 65–99)

## 2015-04-24 SURGERY — PHACOEMULSIFICATION, CATARACT, WITH IOL INSERTION
Anesthesia: Monitor Anesthesia Care | Site: Eye | Laterality: Left | Wound class: Clean

## 2015-04-24 MED ORDER — PHENYLEPHRINE HCL 10 % OP SOLN
1.0000 [drp] | OPHTHALMIC | Status: AC | PRN
  Administered 2015-04-24 (×4): 1 [drp] via OPHTHALMIC

## 2015-04-24 MED ORDER — CEFUROXIME OPHTHALMIC INJECTION 1 MG/0.1 ML
INJECTION | OPHTHALMIC | Status: AC
  Filled 2015-04-24: qty 0.1

## 2015-04-24 MED ORDER — BSS IO SOLN
INTRAOCULAR | Status: DC | PRN
  Administered 2015-04-24: 1 mL via OPHTHALMIC

## 2015-04-24 MED ORDER — CEFUROXIME OPHTHALMIC INJECTION 1 MG/0.1 ML
INJECTION | OPHTHALMIC | Status: DC | PRN
  Administered 2015-04-24: .1 mL via INTRACAMERAL

## 2015-04-24 MED ORDER — BUPIVACAINE HCL (PF) 0.75 % IJ SOLN
INTRAMUSCULAR | Status: AC
  Filled 2015-04-24: qty 10

## 2015-04-24 MED ORDER — MOXIFLOXACIN HCL 0.5 % OP SOLN
OPHTHALMIC | Status: AC
  Filled 2015-04-24: qty 3

## 2015-04-24 MED ORDER — NA CHONDROIT SULF-NA HYALURON 40-17 MG/ML IO SOLN
INTRAOCULAR | Status: DC | PRN
  Administered 2015-04-24: 1 mL via INTRAOCULAR

## 2015-04-24 MED ORDER — CYCLOPENTOLATE HCL 2 % OP SOLN
1.0000 [drp] | OPHTHALMIC | Status: AC | PRN
  Administered 2015-04-24 (×4): 1 [drp] via OPHTHALMIC

## 2015-04-24 MED ORDER — NA CHONDROIT SULF-NA HYALURON 40-17 MG/ML IO SOLN
INTRAOCULAR | Status: AC
  Filled 2015-04-24: qty 1

## 2015-04-24 MED ORDER — SODIUM CHLORIDE 0.9 % IV SOLN
INTRAVENOUS | Status: DC
  Administered 2015-04-24: 07:00:00 via INTRAVENOUS

## 2015-04-24 MED ORDER — MOXIFLOXACIN HCL 0.5 % OP SOLN
1.0000 [drp] | OPHTHALMIC | Status: AC | PRN
  Administered 2015-04-24 (×3): 1 [drp] via OPHTHALMIC

## 2015-04-24 MED ORDER — TETRACAINE HCL 0.5 % OP SOLN
OPHTHALMIC | Status: AC
  Filled 2015-04-24: qty 2

## 2015-04-24 MED ORDER — ALFENTANIL 500 MCG/ML IJ INJ
INJECTION | INTRAMUSCULAR | Status: DC | PRN
  Administered 2015-04-24: 500 ug via INTRAVENOUS

## 2015-04-24 MED ORDER — TETRACAINE HCL 0.5 % OP SOLN
OPHTHALMIC | Status: DC | PRN
  Administered 2015-04-24: 1 [drp] via OPHTHALMIC

## 2015-04-24 MED ORDER — MIDAZOLAM HCL 2 MG/2ML IJ SOLN
INTRAMUSCULAR | Status: DC | PRN
  Administered 2015-04-24: 1 mg via INTRAVENOUS

## 2015-04-24 MED ORDER — HYALURONIDASE HUMAN 150 UNIT/ML IJ SOLN
INTRAMUSCULAR | Status: AC
  Filled 2015-04-24: qty 1

## 2015-04-24 MED ORDER — CYCLOPENTOLATE HCL 2 % OP SOLN
OPHTHALMIC | Status: AC
  Filled 2015-04-24: qty 2

## 2015-04-24 MED ORDER — LIDOCAINE HCL (PF) 4 % IJ SOLN
INTRAMUSCULAR | Status: AC
  Filled 2015-04-24: qty 10

## 2015-04-24 MED ORDER — LIDOCAINE HCL (PF) 4 % IJ SOLN
INTRAOCULAR | Status: DC | PRN
  Administered 2015-04-24: .1 mL via OPHTHALMIC

## 2015-04-24 MED ORDER — CARBACHOL 0.01 % IO SOLN
INTRAOCULAR | Status: DC | PRN
  Administered 2015-04-24: .5 mL via INTRAOCULAR

## 2015-04-24 MED ORDER — EPINEPHRINE HCL 1 MG/ML IJ SOLN
INTRAMUSCULAR | Status: AC
  Filled 2015-04-24: qty 2

## 2015-04-24 MED ORDER — LIDOCAINE HCL (PF) 4 % IJ SOLN
INTRAMUSCULAR | Status: DC | PRN
  Administered 2015-04-24: 4 mL via OPHTHALMIC

## 2015-04-24 MED ORDER — PHENYLEPHRINE HCL 10 % OP SOLN
OPHTHALMIC | Status: AC
  Filled 2015-04-24: qty 5

## 2015-04-24 MED ORDER — MOXIFLOXACIN HCL 0.5 % OP SOLN
OPHTHALMIC | Status: DC | PRN
  Administered 2015-04-24: 1 [drp] via OPHTHALMIC

## 2015-04-24 SURGICAL SUPPLY — 29 items

## 2015-04-24 NOTE — Op Note (Signed)
Date of Surgery: 04/24/2015 Date of Dictation: 04/24/2015 8:55 AM Pre-operative Diagnosis:  Nuclear Sclerotic Cataract left Eye Post-operative Diagnosis: same Procedure performed: Extra-capsular Cataract Extraction (ECCE) with placement of a posterior chamber intraocular lens (IOL) left Eye IOL:  Implant Name Type Inv. Item Serial No. Manufacturer Lot No. LRB No. Used  LENS IOL ACRYSOF IQ 21.0 - [id number] Intraocular Lens LENS IOL ACRYSOF IQ 21.0 [id number] ALCON   Left 1   Anesthesia: 2% Lidocaine and 4% Marcaine in a 50/50 mixture with 10 unites/ml of Hylenex given as a peribulbar Anesthesiologist: Anesthesiologist: Elyse Hsu, MD CRNA: Jonna Clark, CRNA Complications: none Estimated Blood Loss: less than 1 ml  Description of procedure:  The patient was given anesthesia and sedation via intravenous access. The patient was then prepped and draped in the usual fashion. A 25-gauge needle was bent for initiating the capsulorhexis. A 5-0 silk suture was placed through the conjunctiva superior and inferiorly to serve as bridle sutures. Hemostasis was obtained at the superior limbus using an eraser cautery. A partial thickness groove was made at the anterior surgical limbus with a 64 Beaver blade and this was dissected anteriorly with an [employer]. The anterior chamber was entered at 10 o'clock with a 1.0 mm paracentesis knife and through the lamellar dissection with a 2.6 mm Alcon keratome. Epi-Shugarcaine 0.5 CC [9 cc BSS Plus (Alcon), 3 cc 4% preservative-free lidocaine (Hospira) and 4 cc 1:1000 preservative-free, bisulfite-free epinephrine] was injected into the anterior chamber via the paracentesis tract. Epi-Shugarcaine 0.5 CC [9 cc BSS Plus (Alcon), 3 cc 4% preservative-free lidocaine (Hospira) and 4 cc 1:1000 preservative-free, bisulfite-free epinephrine] was injected into the anterior chamber via the paracentesis tract. DiscoVisc was injected to replace the aqueous and a  continuous tear curvilinear capsulorhexis was performed using a bent 25-gauge needle.  Balance salt on a syringe was used to perform hydro-dissection and phacoemulsification was carried out using a divide and conquer technique. Procedure(s) with comments: CATARACT EXTRACTION PHACO AND INTRAOCULAR LENS PLACEMENT ([facility]) (Left) - [place] 01:12 AP% 25.7 CDE 28.30 fluid pack lot [id number]. Irrigation/aspiration was used to remove the residual cortex and the capsular bag was inflated with DiscoVisc. The intraocular lens was inserted into the capsular bag using a pre-loaded UltraSert Delivery System. Irrigation/aspiration was used to remove the residual DiscoVisc. The wound was inflated with balanced salt and checked for leaks. None were found. Miostat was injected via the paracentesis track and 0.1 ml of cefuroxime containing 1 mg of drug  was injected via the paracentesis track. The wound was checked for leaks again and none were found.   The bridal sutures were removed and two drops of Vigamox were placed on the eye. An eye shield was placed to protect the eye and the patient was discharged to the recovery area in good condition.   Josiane Labine MD

## 2015-04-24 NOTE — Anesthesia Preprocedure Evaluation (Signed)
Anesthesia Evaluation  Patient identified by MRN, date of birth, ID band Patient awake    Reviewed: Allergy & Precautions, NPO status , Patient's Chart, lab work & pertinent test results  Airway Mallampati: II  TM Distance: >3 FB Neck ROM: Limited    Dental  (+) Partial Lower, Partial Upper   Pulmonary former smoker,    Pulmonary exam normal        Cardiovascular Exercise Tolerance: Good hypertension, Pt. on medications Normal cardiovascular exam     Neuro/Psych    GI/Hepatic   Endo/Other  diabetes, Type 2BG 141.  Renal/GU      Musculoskeletal   Abdominal Normal abdominal exam  (+)   Peds  Hematology   Anesthesia Other Findings   Reproductive/Obstetrics                             Anesthesia Physical Anesthesia Plan  ASA: III  Anesthesia Plan: MAC   Post-op Pain Management:    Induction: Intravenous  Airway Management Planned: Nasal Cannula  Additional Equipment:   Intra-op Plan:   Post-operative Plan:   Informed Consent: I have reviewed the patients History and Physical, chart, labs and discussed the procedure including the risks, benefits and alternatives for the proposed anesthesia with the patient or authorized representative who has indicated his/her understanding and acceptance.     Plan Discussed with: CRNA  Anesthesia Plan Comments:         Anesthesia Quick Evaluation

## 2015-04-24 NOTE — Transfer of Care (Signed)
Immediate Anesthesia Transfer of Care Note  Patient: [name]  Procedure(s) Performed: Procedure(s) with comments: CATARACT EXTRACTION PHACO AND INTRAOCULAR LENS PLACEMENT ([facility]) (Left) - [place] 01:12 AP% 25.7 CDE 28.30 fluid pack lot [id number]  Patient Location: PACU and Short Stay  Anesthesia Type:MAC combined with regional for post-op pain  Level of Consciousness: awake, alert  and oriented  Airway & Oxygen Therapy: Patient Spontanous Breathing  Post-op Assessment: Report given to RN and Post -op Vital signs reviewed and stable  Post vital signs: Reviewed and stable  Last Vitals:  Filed Vitals:   04/24/15 0855 04/24/15 0856  BP: 176/85 176/85  Pulse:  56  Temp: 36.8 C 35.6 C  Resp: 16 16    Complications: No apparent anesthesia complications

## 2015-04-24 NOTE — Anesthesia Postprocedure Evaluation (Signed)
Anesthesia Post Note  Patient: ONA SEDGWICK  Procedure(s) Performed: Procedure(s) (LRB): CATARACT EXTRACTION PHACO AND INTRAOCULAR LENS PLACEMENT ([place]) (Left)  Patient location during evaluation: Short Stay Anesthesia Type: MAC Level of consciousness: awake and alert and oriented Pain management: pain level controlled Vital Signs Assessment: post-procedure vital signs reviewed and stable Respiratory status: spontaneous breathing Cardiovascular status: stable Postop Assessment: No headache and No signs of nausea or vomiting Anesthetic complications: no    Last Vitals:  Filed Vitals:   04/24/15 0855 04/24/15 0856  BP: 176/85 176/85  Pulse:  56  Temp: 36.8 C 35.6 C  Resp: 16 16    Last Pain: There were no vitals filed for this visit.               Lanora Manis

## 2015-04-24 NOTE — Interval H&P Note (Signed)
History and Physical Interval Note:  04/24/2015 7:22 AM  [name]  has presented today for surgery, with the diagnosis of CATARACT  The various methods of treatment have been discussed with the patient and family. After consideration of risks, benefits and other options for treatment, the patient has consented to  Procedure(s): CATARACT EXTRACTION PHACO AND INTRAOCULAR LENS PLACEMENT ([place]) (Left) as a surgical intervention .  The patient's history has been reviewed, patient examined, no change in status, stable for surgery.  I have reviewed the patient's chart and labs.  Questions were answered to the patient's satisfaction.     Judene Logue

## 2015-04-24 NOTE — H&P (Signed)
See scanned note.

## 2015-04-24 NOTE — Addendum Note (Signed)
Addendum  created 04/24/15 0916 by Elyse Hsu, MD   Modules edited: Anesthesia Events, Narrator   Narrator:  Narrator: Event Log Edited

## 2015-04-24 NOTE — Discharge Instructions (Signed)
Eye Surgery Discharge Instructions  Expect mild scratchy sensation or mild soreness. DO NOT RUB YOUR EYE!  The day of surgery:  Minimal physical activity, but bed rest is not required  No reading, computer work, or close hand work  No bending, lifting, or straining.  May watch TV  For 24 hours:  No driving, legal decisions, or alcoholic beverages  Safety precautions  Eat anything you prefer: It is better to start with liquids, then soup then solid foods.  _____ Eye patch should be worn until postoperative exam tomorrow.  ____ Solar shield eyeglasses should be worn for comfort in the sunlight/patch while sleeping  Resume all regular medications including aspirin or Coumadin if these were discontinued prior to surgery. You may shower, bathe, shave, or wash your hair. Tylenol may be taken for mild discomfort.  Call your doctor if you experience significant pain, nausea, or vomiting, fever > 101 or other signs of infection. [phone] or [phone] Specific instructions:  Follow-up Information    Follow up with Estill Cotta, MD.   Specialty:  Ophthalmology   Why:  04/25/15 @ 10:05 am   Contact information:   [address]   [place] [address] [phone]

## 2015-08-18 ENCOUNTER — Other Ambulatory Visit: Payer: Self-pay | Admitting: Family Medicine

## 2015-08-24 ENCOUNTER — Ambulatory Visit: Payer: Medicare Other | Admitting: Family Medicine

## 2015-08-25 ENCOUNTER — Ambulatory Visit (INDEPENDENT_AMBULATORY_CARE_PROVIDER_SITE_OTHER): Payer: Medicare Other | Admitting: Family Medicine

## 2015-08-25 ENCOUNTER — Encounter: Payer: Self-pay | Admitting: Family Medicine

## 2015-08-25 VITALS — BP 140/67 | HR 82 | Temp 98.3°F | Resp 16 | Ht 70.0 in | Wt 175.0 lb

## 2015-08-25 DIAGNOSIS — J069 Acute upper respiratory infection, unspecified: Secondary | ICD-10-CM | POA: Diagnosis not present

## 2015-08-25 DIAGNOSIS — M25441 Effusion, right hand: Secondary | ICD-10-CM | POA: Diagnosis not present

## 2015-08-25 MED ORDER — DM-GUAIFENESIN ER 30-600 MG PO TB12: 1.0000 | ORAL_TABLET | Freq: Two times a day (BID) | ORAL | Status: DC

## 2015-08-25 MED ORDER — OXYMETAZOLINE HCL 0.05 % NA SOLN: 1.0000 | Freq: Two times a day (BID) | NASAL | Status: DC

## 2015-08-25 MED ORDER — NAPROXEN 375 MG PO TABS: 375.0000 mg | ORAL_TABLET | Freq: Two times a day (BID) | ORAL | Status: DC

## 2015-08-25 MED ORDER — FLUTICASONE PROPIONATE 50 MCG/ACT NA SUSP: 2.0000 | Freq: Every day | NASAL | Status: DC

## 2015-08-25 NOTE — Progress Notes (Signed)
Subjective:    Patient ID: [name], male    DOB: 30-Jul-1933, 82 y.o.   MRN: [id number]  HPI: [name] is a 82 y.o. male presenting on 08/25/2015 for Hand Pain   HPI  Pt presents for hand pain. 5th finger on R hand. Swelling in PIP joint- started 5 days. Joint was red and tender to touch. Pt states it is feeling better. He thought it was arthritis related. Taking naproxen over the counter to help with pain. He feels that it has helped. Finger feels better. No trauma to finger. No fevers. Redness solely at the PIP. No history of gout.   Pt also reporting cold symptoms x 2-3 days. Started with scratchy throat. Now stuff nose and some cough. Clear sputum and nasal drainage. No fevers. Some sinus tenderness. No shortness of breath or trouble breathing.    Past Medical History  Diagnosis Date  . Diabetes mellitus without complication ([place])   . Hyperlipidemia   . Hypertension   . Prostate cancer ([place])   . Neuropathy [facility])     Current Outpatient Prescriptions on File Prior to Visit  Medication Sig  . Alpha-Lipoic Acid 600 MG CAPS Take 1 capsule (600 mg total) by mouth daily.  . capsaicin (ZOSTRIX) 0.025 % cream Apply topically 2 (two) times daily.  . chlorhexidine (PERIDEX) 0.12 % solution 1 mL by Mouth Rinse route as directed.  . DUREZOL 0.05 % EMUL Place [id number] application into the left eye as needed.  . gabapentin (NEURONTIN) 600 MG tablet Take 1 tablet (600 mg total) by mouth 3 (three) times daily. (Patient taking differently: Take 400 mg by mouth 3 (three) times daily. )  . ILEVRO 0.3 % ophthalmic suspension Place 0.3 application into the left eye as directed.  Marland Kitchen lisinopril (PRINIVIL,ZESTRIL) 10 MG tablet Take 10 mg by mouth daily.  . metFORMIN (GLUCOPHAGE-XR) 500 MG 24 hr tablet Take 500 mg by mouth daily.  . VOLTAREN 1 % GEL Apply 1 g topically as needed.  Marland Kitchen ZETIA 10 MG tablet Take 10 mg by mouth daily.   No current facility-administered medications on file prior to  visit.    Review of Systems  Constitutional: Negative for fever and chills.  HENT: Positive for congestion, rhinorrhea, sinus pressure and sore throat. Negative for ear pain.   Respiratory: Positive for cough. Negative for chest tightness, shortness of breath and wheezing.   Cardiovascular: Negative for chest pain, palpitations and leg swelling.  Gastrointestinal: Negative for nausea, vomiting and abdominal pain.  Endocrine: Negative.   Genitourinary: Negative for dysuria, urgency, discharge, penile pain and testicular pain.  Musculoskeletal: Positive for joint swelling and arthralgias. Negative for back pain.  Skin: Negative.   Neurological: Negative for dizziness, weakness, numbness and headaches.  Psychiatric/Behavioral: Negative for sleep disturbance and dysphoric mood.   Per HPI unless specifically indicated above     Objective:    BP 140/67 mmHg  Pulse 82  Temp(Src) 98.3 F (36.8 C) (Oral)  Resp 16  Ht 5\' 10"  (1.778 m)  Wt 175 lb (79.379 kg)  BMI 25.11 kg/m2  Wt Readings from Last 3 Encounters:  08/25/15 175 lb (79.379 kg)  04/24/15 170 lb (77.111 kg)  04/18/15 169 lb (76.658 kg)    Physical Exam  HENT:  Head: Normocephalic and atraumatic.  Right Ear: Hearing and tympanic membrane normal.  Left Ear: Hearing and tympanic membrane normal.  Nose: Mucosal edema present. Right sinus exhibits maxillary sinus tenderness (mildly tender). Right sinus exhibits no  frontal sinus tenderness. Left sinus exhibits maxillary sinus tenderness (mildy tender). Left sinus exhibits no frontal sinus tenderness.  Mouth/Throat: No posterior oropharyngeal erythema.  Musculoskeletal:       Right hand: He exhibits tenderness and swelling.       Hands: herbendens and brouchards nodules bilateral hands all fingers.    Results for orders placed or performed during the hospital encounter of 04/24/15  Glucose, capillary  Result Value Ref Range   Glucose-Capillary 141 (H) 65 - 99 mg/dL    Comment 1 Notify RN       Assessment & Plan:   Problem List Items Addressed This Visit    None    Visit Diagnoses    Finger joint swelling, right    -  Primary    Gout vs. arthritis flare. Treat with naproxen BID- low dose due to age. Check uric acid levels next visit. Consider XR if not improving.     Relevant Medications    naproxen (NAPROSYN) 375 MG tablet    Upper respiratory infection        Mild URI. Home treatment. Alarm symptoms reviewed. Return if symptoms not improving.        Meds ordered this encounter  Medications  . glucosamine-chondroitin 500-400 MG tablet    Sig: Take 1 tablet by mouth 3 (three) times daily.  . naproxen (NAPROSYN) 375 MG tablet    Sig: Take 1 tablet (375 mg total) by mouth 2 (two) times daily with a meal.    Dispense:  30 tablet    Refill:  0    Order Specific Question:  Supervising Provider    Answer:  Arlis Porta [phone]  . fluticasone (FLONASE) 50 MCG/ACT nasal spray    Sig: Place 2 sprays into both nostrils daily.    Dispense:  16 g    Refill:  11    Order Specific Question:  Supervising Provider    Answer:  Arlis Porta [phone]  . dextromethorphan-guaiFENesin (MUCINEX DM) 30-600 MG 12hr tablet    Sig: Take 1 tablet by mouth 2 (two) times daily.    Dispense:  20 tablet    Refill:  0    Order Specific Question:  Supervising Provider    Answer:  Arlis Porta [phone]  . oxymetazoline (AFRIN NASAL SPRAY) 0.05 % nasal spray    Sig: Place 1 spray into both nostrils 2 (two) times daily. Use for 3 days and 3 days only.    Dispense:  30 mL    Refill:  0    Order Specific Question:  Supervising Provider    Answer:  Arlis Porta [id number]      Follow up plan: Return if symptoms worsen or fail to improve, for keep previously scheduled appt. Marland Kitchen

## 2015-08-25 NOTE — Patient Instructions (Signed)
Your symptoms are consistent with a viral upper respiratory infection. At this time there is no need for antibiotics.  If your symptoms persist for > 10 days or get better and than worsen please let me know. You may have a secondary bacterial infection.  You can use supportive care at home to help with your symptoms. I have sent Mucinex DM to your pharmacy to help break up the congestion and soothe your cough. You can takes this twice daily.   Honey is a natural cough suppressant- so add it to your tea in the morning.  If you have a humidifer, set that up in your bedroom at night.   Please seek immediate medical attention if you develop shortness of breath not relieve by inhaler, chest pain/tightness, fever > 103 F or other concerning symptoms.    Finger: Take naproxen twice daily to help with your finger swelling and pain. IF your finger becomes very red, swollen, tender, or drains purulent drainage- please seek immediate medical attention.

## 2015-08-28 ENCOUNTER — Other Ambulatory Visit: Payer: Self-pay | Admitting: Family Medicine

## 2015-08-31 ENCOUNTER — Ambulatory Visit (INDEPENDENT_AMBULATORY_CARE_PROVIDER_SITE_OTHER): Payer: Medicare Other | Admitting: Family Medicine

## 2015-08-31 ENCOUNTER — Encounter: Payer: Self-pay | Admitting: Family Medicine

## 2015-08-31 VITALS — BP 117/72 | HR 71 | Temp 98.1°F | Resp 16 | Ht 70.0 in | Wt 172.6 lb

## 2015-08-31 DIAGNOSIS — E1169 Type 2 diabetes mellitus with other specified complication: Secondary | ICD-10-CM

## 2015-08-31 DIAGNOSIS — I129 Hypertensive chronic kidney disease with stage 1 through stage 4 chronic kidney disease, or unspecified chronic kidney disease: Secondary | ICD-10-CM | POA: Diagnosis not present

## 2015-08-31 DIAGNOSIS — E119 Type 2 diabetes mellitus without complications: Secondary | ICD-10-CM | POA: Diagnosis not present

## 2015-08-31 DIAGNOSIS — E1142 Type 2 diabetes mellitus with diabetic polyneuropathy: Secondary | ICD-10-CM | POA: Diagnosis not present

## 2015-08-31 DIAGNOSIS — N183 Chronic kidney disease, stage 3 (moderate): Secondary | ICD-10-CM | POA: Diagnosis not present

## 2015-08-31 DIAGNOSIS — E785 Hyperlipidemia, unspecified: Secondary | ICD-10-CM | POA: Diagnosis not present

## 2015-08-31 LAB — POCT GLYCOSYLATED HEMOGLOBIN (HGB A1C): HEMOGLOBIN A1C: 7

## 2015-08-31 NOTE — Progress Notes (Signed)
Name: CLIFF ROTHFELD   MRN: [id number]    DOB: 05-02-1934   Date:08/31/2015       Progress Note  Subjective  Chief Complaint  Chief Complaint  Patient presents with  . Follow-up    Diabetes  . Diabetes    HPI For f/u of DM.  Has arthritis and HBP and peripheral neuropathy.  His neuropathy is improving since he is on higher dose of Gabapentin.  He still has some cough left over from cough, but not all day long and not worsening.  No problem-specific assessment & plan notes found for this encounter.   Past Medical History  Diagnosis Date  . Diabetes mellitus without complication ([place])   . Hyperlipidemia   . Hypertension   . Prostate cancer ([place])   . Neuropathy [facility])     Past Surgical History  Procedure Laterality Date  . Colonoscopy    . Cataract extraction w/phaco Left 04/24/2015    Procedure: CATARACT EXTRACTION PHACO AND INTRAOCULAR LENS PLACEMENT (IOC);  Surgeon: Estill Cotta, MD;  Location: [facility] ORS;  Service: Ophthalmology;  Laterality: Left;  [place] 01:12 AP% 25.7 CDE 28.30 fluid pack lot [id number]    Family History  Problem Relation Age of Onset  . Stroke Mother   . Cancer Mother     colon cancer  . COPD Father     Social History   Social History  . Marital Status: Widowed    Spouse Name: N/A  . Number of Children: N/A  . Years of Education: N/A   Occupational History  . Not on file.   Social History Main Topics  . Smoking status: Former Smoker    Quit date: 05/27/1978  . Smokeless tobacco: Not on file  . Alcohol Use: No  . Drug Use: No  . Sexual Activity: Not on file   Other Topics Concern  . Not on file   Social History Narrative     Current outpatient prescriptions:  .  capsaicin (ZOSTRIX) 0.025 % cream, Apply topically 2 (two) times daily., Disp: 60 g, Rfl: 0 .  chlorhexidine (PERIDEX) 0.12 % solution, 1 mL by Mouth Rinse route as directed., Disp: , Rfl:  .  dextromethorphan-guaiFENesin (MUCINEX DM) 30-600 MG 12hr tablet, Take 1  tablet by mouth 2 (two) times daily., Disp: 20 tablet, Rfl: 0 .  DUREZOL 0.05 % EMUL, Place [id number] application into the left eye as needed., Disp: , Rfl:  .  fluticasone (FLONASE) 50 MCG/ACT nasal spray, Place 2 sprays into both nostrils daily., Disp: 16 g, Rfl: 11 .  gabapentin (NEURONTIN) 600 MG tablet, Take 1 tablet (600 mg total) by mouth 3 (three) times daily. (Patient taking differently: Take 400 mg by mouth 3 (three) times daily. ), Disp: 90 tablet, Rfl: 12 .  glucosamine-chondroitin 500-400 MG tablet, Take 1 tablet by mouth 3 (three) times daily., Disp: , Rfl:  .  ILEVRO 0.3 % ophthalmic suspension, Place 0.3 application into the left eye as directed., Disp: , Rfl:  .  lisinopril (PRINIVIL,ZESTRIL) 10 MG tablet, 1 (one) Tablet, Oral, daily, Disp: 90 tablet, Rfl: 0 .  metFORMIN (GLUCOPHAGE-XR) 500 MG 24 hr tablet, Take 500 mg by mouth daily., Disp: , Rfl:  .  naproxen (NAPROSYN) 375 MG tablet, Take 1 tablet (375 mg total) by mouth 2 (two) times daily with a meal., Disp: 30 tablet, Rfl: 0 .  oxymetazoline (AFRIN NASAL SPRAY) 0.05 % nasal spray, Place 1 spray into both nostrils 2 (two) times daily. Use for 3 days and  3 days only., Disp: 30 mL, Rfl: 0 .  VOLTAREN 1 % GEL, Apply 1 g topically as needed., Disp: , Rfl:  .  ZETIA 10 MG tablet, Take 10 mg by mouth daily., Disp: , Rfl:   Not on File   Review of Systems  Constitutional: Negative for fever, chills, weight loss and malaise/fatigue.  HENT: Negative for hearing loss.   Eyes: Negative for blurred vision and double vision.  Respiratory: Positive for cough (mild). Negative for shortness of breath and wheezing.   Cardiovascular: Negative for chest pain, palpitations and leg swelling.  Gastrointestinal: Negative for heartburn, abdominal pain and blood in stool.  Genitourinary: Negative for dysuria, urgency and frequency.  Musculoskeletal: Positive for joint pain.  Skin: Negative for rash.  Neurological: Negative for tremors, weakness and  headaches.      Objective  Filed Vitals:   08/31/15 1312  BP: 117/72  Pulse: 71  Temp: 98.1 F (36.7 C)  TempSrc: Oral  Resp: 16  Height: 5\' 10"  (1.778 m)  Weight: 172 lb 9.6 oz (78.291 kg)    Physical Exam  Constitutional: He is oriented to person, place, and time and well-developed, well-nourished, and in no distress. No distress.  HENT:  Head: Normocephalic and atraumatic.  Eyes: Conjunctivae and EOM are normal. Pupils are equal, round, and reactive to light. No scleral icterus.  Neck: Normal range of motion. Neck supple. Carotid bruit is not present. No thyromegaly present.  Cardiovascular: Normal rate, regular rhythm and normal heart sounds.  Exam reveals no gallop and no friction rub.   No murmur heard. Pulmonary/Chest: Effort normal and breath sounds normal. No respiratory distress. He has no wheezes. He has no rales.  Abdominal: Soft. Bowel sounds are normal. He exhibits no distension, no abdominal bruit and no mass. There is no tenderness.  Musculoskeletal: He exhibits no edema.  Lymphadenopathy:    He has no cervical adenopathy.  Neurological: He is alert and oriented to person, place, and time.  Vitals reviewed.      Recent Results (from the past 2160 hour(s))  POCT HgB A1C     Status: Abnormal   Collection Time: 08/31/15  1:28 PM  Result Value Ref Range   Hemoglobin A1C 7.0      Assessment & Plan  Problem List Items Addressed This Visit      Cardiovascular and Mediastinum   Benign hypertension with CKD (chronic kidney disease) stage III     Endocrine   Type 2 diabetes mellitus with diabetic polyneuropathy (HCC)   Relevant Orders   Comprehensive Metabolic Panel (CMET)   CBC with Differential     Other   Hyperlipidemia associated with type 2 diabetes mellitus ([place])   Relevant Orders   Lipid Profile    Other Visit Diagnoses    Type 2 diabetes mellitus without complication, unspecified long term insulin use status ([place])    -  Primary     Relevant Orders    POCT HgB A1C (Completed)       No orders of the defined types were placed in this encounter.   1. Type 2 diabetes mellitus without complication, unspecified long term insulin use status (HCC) Cont. Metformin - POCT HgB A1C-7.0  2. Type 2 diabetes mellitus with diabetic polyneuropathy, unspecified long term insulin use status (HCC) Cont Gabapentin - Comprehensive Metabolic Panel (CMET) - CBC with Differential RTC-3 months 3. Benign hypertension with CKD (chronic kidney disease) stage III Cont Lisinopril  4. Hyperlipidemia associated with type 2 diabetes mellitus ([place])  Cont. Celine Mans - Lipid Profile

## 2015-09-04 DIAGNOSIS — E785 Hyperlipidemia, unspecified: Secondary | ICD-10-CM | POA: Diagnosis not present

## 2015-09-04 DIAGNOSIS — E1169 Type 2 diabetes mellitus with other specified complication: Secondary | ICD-10-CM | POA: Diagnosis not present

## 2015-09-04 DIAGNOSIS — E1142 Type 2 diabetes mellitus with diabetic polyneuropathy: Secondary | ICD-10-CM | POA: Diagnosis not present

## 2015-09-05 ENCOUNTER — Other Ambulatory Visit: Payer: Self-pay | Admitting: Family Medicine

## 2015-09-05 DIAGNOSIS — E875 Hyperkalemia: Secondary | ICD-10-CM

## 2015-09-05 LAB — COMPREHENSIVE METABOLIC PANEL
ALBUMIN: 4.6 g/dL (ref 3.5–4.8)
ALT: 13 IU/L (ref 0–44)
AST: 21 IU/L (ref 0–40)
Albumin/Globulin Ratio: 1.6 (ref 1.2–2.2)
Alkaline Phosphatase: 48 IU/L (ref 39–117)
BUN/Creatinine Ratio: 22 (ref 10–24)
BUN: 25 mg/dL (ref 8–27)
Bilirubin Total: 0.5 mg/dL (ref 0.0–1.2)
CALCIUM: 9.8 mg/dL (ref 8.6–10.2)
CO2: 23 mmol/L (ref 18–29)
CREATININE: 1.16 mg/dL (ref 0.76–1.27)
Chloride: 96 mmol/L (ref 96–106)
GFR calc non Af Amer: 60 mL/min/{1.73_m2} (ref >59)
GFR, EST AFRICAN AMERICAN: 69 mL/min/{1.73_m2} (ref >59)
GLUCOSE: 122 mg/dL — AB (ref 65–99)
Globulin, Total: 2.8 g/dL (ref 1.5–4.5)
Potassium: 5.5 mmol/L — ABNORMAL HIGH (ref 3.5–5.2)
Sodium: 140 mmol/L (ref 134–144)
TOTAL PROTEIN: 7.4 g/dL (ref 6.0–8.5)

## 2015-09-05 LAB — CBC WITH DIFFERENTIAL/PLATELET
BASOS ABS: 0 10*3/uL (ref 0.0–0.2)
Basos: 1 %
EOS (ABSOLUTE): 0.1 10*3/uL (ref 0.0–0.4)
Eos: 2 %
HEMOGLOBIN: 16.8 g/dL (ref 12.6–17.7)
Hematocrit: 47.6 % (ref 37.5–51.0)
Immature Grans (Abs): 0 10*3/uL (ref 0.0–0.1)
Immature Granulocytes: 0 %
LYMPHS ABS: 2.2 10*3/uL (ref 0.7–3.1)
Lymphs: 34 %
MCH: 29.9 pg (ref 26.6–33.0)
MCHC: 35.3 g/dL (ref 31.5–35.7)
MCV: 85 fL (ref 79–97)
MONOCYTES: 11 %
MONOS ABS: 0.7 10*3/uL (ref 0.1–0.9)
NEUTROS ABS: 3.4 10*3/uL (ref 1.4–7.0)
Neutrophils: 52 %
Platelets: 144 10*3/uL — ABNORMAL LOW (ref 150–379)
RBC: 5.62 x10E6/uL (ref 4.14–5.80)
RDW: 13.6 % (ref 12.3–15.4)
WBC: 6.4 10*3/uL (ref 3.4–10.8)

## 2015-09-05 LAB — LIPID PANEL
CHOL/HDL RATIO: 5.7 ratio — AB (ref 0.0–5.0)
Cholesterol, Total: 182 mg/dL (ref 100–199)
HDL: 32 mg/dL — AB (ref >39)
LDL CALC: 106 mg/dL — AB (ref 0–99)
Triglycerides: 222 mg/dL — ABNORMAL HIGH (ref 0–149)
VLDL CHOLESTEROL CAL: 44 mg/dL — AB (ref 5–40)

## 2015-11-09 ENCOUNTER — Other Ambulatory Visit: Payer: Self-pay | Admitting: Family Medicine

## 2015-11-13 ENCOUNTER — Telehealth: Payer: Self-pay | Admitting: Family Medicine

## 2015-11-13 ENCOUNTER — Other Ambulatory Visit: Payer: Self-pay | Admitting: Family Medicine

## 2015-11-13 ENCOUNTER — Other Ambulatory Visit: Payer: Self-pay

## 2015-11-13 DIAGNOSIS — E870 Hyperosmolality and hypernatremia: Secondary | ICD-10-CM | POA: Diagnosis not present

## 2015-11-13 NOTE — Telephone Encounter (Signed)
I would need to see him to get any pain meds for him, but my schedule has been filled for the next 2 weeks unless there is a cancellation.  We can put in an order for BMP to be done here to follow his sodium

## 2015-11-13 NOTE — Telephone Encounter (Signed)
Pt informed and will schedule appointment for this Wednesday and lab order released from system.

## 2015-11-13 NOTE — Telephone Encounter (Signed)
Pt wanted Dr. Luan Pulling to know that he fell off a piece of equipment and injured his neck and spine.  Dr. Olen Cordial, chiropractor is treating him for the whiplash.  He asked about pain medication but didn't schedule an appt.  He did ask for a call back [phone].  He also said he was supposed to do an additional lab for sodium.  He didn't go to [facility] but said if he still needs to do it just let him know.

## 2015-11-15 ENCOUNTER — Encounter: Payer: Self-pay | Admitting: Family Medicine

## 2015-11-15 ENCOUNTER — Ambulatory Visit (INDEPENDENT_AMBULATORY_CARE_PROVIDER_SITE_OTHER): Payer: Medicare Other | Admitting: Family Medicine

## 2015-11-15 VITALS — BP 159/86 | HR 65 | Temp 97.9°F | Resp 16 | Ht 70.0 in | Wt 170.0 lb

## 2015-11-15 DIAGNOSIS — M6248 Contracture of muscle, other site: Secondary | ICD-10-CM | POA: Diagnosis not present

## 2015-11-15 DIAGNOSIS — M62838 Other muscle spasm: Secondary | ICD-10-CM

## 2015-11-15 MED ORDER — METAXALONE 800 MG PO TABS
800.0000 mg | ORAL_TABLET | Freq: Three times a day (TID) | ORAL | Status: DC
Start: 2015-11-15 — End: 2016-05-13

## 2015-11-15 MED ORDER — MELOXICAM 15 MG PO TABS: 15.0000 mg | ORAL_TABLET | Freq: Every day | ORAL | Status: DC

## 2015-11-15 NOTE — Progress Notes (Signed)
Name: [name]   MRN: [id number]    DOB: 1933-11-07   Date:11/15/2015       Progress Note  Subjective  Chief Complaint  Chief Complaint  Patient presents with  . Neck Pain    pt fell and damaged disc 11/09/2015  pt is seeing [place] chiropractor     HPI Here c/o neck pain after a fall 2 weeks ago.  He has seen  Dr, Olen Cordial, and dx'd as extreme whiplash.  He is icing his neck as he tolerates it.  X-rays showed no fractures  No problem-specific assessment & plan notes found for this encounter.   Past Medical History  Diagnosis Date  . Diabetes mellitus without complication ([place])   . Hyperlipidemia   . Hypertension   . Prostate cancer ([place])   . Neuropathy [facility])     Social History  Substance Use Topics  . Smoking status: Former Smoker    Quit date: 05/27/1978  . Smokeless tobacco: Not on file  . Alcohol Use: No     Current outpatient prescriptions:  .  capsaicin (ZOSTRIX) 0.025 % cream, Apply topically 2 (two) times daily., Disp: 60 g, Rfl: 0 .  chlorhexidine (PERIDEX) 0.12 % solution, 1 mL by Mouth Rinse route as directed., Disp: , Rfl:  .  dextromethorphan-guaiFENesin (MUCINEX DM) 30-600 MG 12hr tablet, Take 1 tablet by mouth 2 (two) times daily., Disp: 20 tablet, Rfl: 0 .  DUREZOL 0.05 % EMUL, Place [id number] application into the left eye as needed., Disp: , Rfl:  .  fluticasone (FLONASE) 50 MCG/ACT nasal spray, Place 2 sprays into both nostrils daily., Disp: 16 g, Rfl: 11 .  gabapentin (NEURONTIN) 600 MG tablet, Take 1 tablet (600 mg total) by mouth 3 (three) times daily. (Patient taking differently: Take 400 mg by mouth 3 (three) times daily. ), Disp: 90 tablet, Rfl: 12 .  glucosamine-chondroitin 500-400 MG tablet, Take 1 tablet by mouth 3 (three) times daily., Disp: , Rfl:  .  ILEVRO 0.3 % ophthalmic suspension, Place 0.3 application into the left eye as directed., Disp: , Rfl:  .  lisinopril (PRINIVIL,ZESTRIL) 10 MG tablet, 1 (one) Tablet, Oral, daily, Disp: 90 tablet,  Rfl: 0 .  metFORMIN (GLUCOPHAGE-XR) 500 MG 24 hr tablet, Take 500 mg by mouth daily., Disp: , Rfl:  .  naproxen (NAPROSYN) 375 MG tablet, Take 1 tablet (375 mg total) by mouth 2 (two) times daily with a meal., Disp: 30 tablet, Rfl: 0 .  oxymetazoline (AFRIN NASAL SPRAY) 0.05 % nasal spray, Place 1 spray into both nostrils 2 (two) times daily. Use for 3 days and 3 days only., Disp: 30 mL, Rfl: 0 .  VOLTAREN 1 % GEL, Apply 1 g topically as needed., Disp: , Rfl:  .  ZETIA 10 MG tablet, 1 (one) Tablet Tablet, Oral, at bedtime, Disp: 90 tablet, Rfl: 0  No Known Allergies  Review of Systems  Constitutional: Negative for fever, chills, weight loss and malaise/fatigue.  HENT: Negative for hearing loss.   Eyes: Negative for blurred vision and double vision.  Respiratory: Negative for cough, shortness of breath and wheezing.   Cardiovascular: Negative for chest pain, palpitations and leg swelling.  Gastrointestinal: Negative for heartburn, abdominal pain and blood in stool.  Genitourinary: Negative for dysuria, urgency and frequency.  Musculoskeletal: Positive for neck pain.  Skin: Negative for rash.  Neurological: Negative for weakness and headaches.      Objective  Filed Vitals:   11/15/15 1004  BP: 159/86  Pulse:  65  Temp: 97.9 F (36.6 C)  TempSrc: Oral  Resp: 16  Height: 5\' 10"  (1.778 m)  Weight: 170 lb (77.111 kg)     Physical Exam  Constitutional: He is well-developed, well-nourished, and in no distress. No distress.  HENT:  Head: Normocephalic and atraumatic.  Neck:  Pain to palpation in R neck trap with muscle spasm.  Pain with some ROM .  No back pain or muscle pain.  Cardiovascular: Normal rate, regular rhythm and normal heart sounds.  Exam reveals no gallop and no friction rub.   No murmur heard. Pulmonary/Chest: Effort normal and breath sounds normal. No respiratory distress. He has no wheezes. He has no rales.  Vitals reviewed.     Recent Results (from the  past 2160 hour(s))  POCT HgB A1C     Status: Abnormal   Collection Time: 08/31/15  1:28 PM  Result Value Ref Range   Hemoglobin A1C 7.0   Comprehensive Metabolic Panel (CMET)     Status: Abnormal   Collection Time: 09/04/15  8:24 AM  Result Value Ref Range   Glucose 122 (H) 65 - 99 mg/dL   BUN 25 8 - 27 mg/dL   Creatinine, Ser 1.16 0.76 - 1.27 mg/dL   GFR calc non Af Amer 60 >59 mL/min/1.73   GFR calc Af Amer 69 >59 mL/min/1.73   BUN/Creatinine Ratio 22 10 - 24   Sodium 140 134 - 144 mmol/L   Potassium 5.5 (H) 3.5 - 5.2 mmol/L   Chloride 96 96 - 106 mmol/L   CO2 23 18 - 29 mmol/L   Calcium 9.8 8.6 - 10.2 mg/dL   Total Protein 7.4 6.0 - 8.5 g/dL   Albumin 4.6 3.5 - 4.8 g/dL   Globulin, Total 2.8 1.5 - 4.5 g/dL   Albumin/Globulin Ratio 1.6 1.2 - 2.2   Bilirubin Total 0.5 0.0 - 1.2 mg/dL   Alkaline Phosphatase 48 39 - 117 IU/L   AST 21 0 - 40 IU/L   ALT 13 0 - 44 IU/L  Lipid Profile     Status: Abnormal   Collection Time: 09/04/15  8:24 AM  Result Value Ref Range   Cholesterol, Total 182 100 - 199 mg/dL   Triglycerides 222 (H) 0 - 149 mg/dL   HDL 32 (L) >39 mg/dL   VLDL Cholesterol Cal 44 (H) 5 - 40 mg/dL   LDL Calculated 106 (H) 0 - 99 mg/dL   Chol/HDL Ratio 5.7 (H) 0.0 - 5.0 ratio units    Comment:                                   T. Chol/HDL Ratio                                             Men  Women                               1/2 Avg.Risk  3.4    3.3                                   Avg.Risk  5.0    4.4  2X Avg.Risk  9.6    7.1                                3X Avg.Risk 23.4   11.0   CBC with Differential     Status: Abnormal   Collection Time: 09/04/15  8:24 AM  Result Value Ref Range   WBC 6.4 3.4 - 10.8 x10E3/uL   RBC 5.62 4.14 - 5.80 x10E6/uL   Hemoglobin 16.8 12.6 - 17.7 g/dL   Hematocrit 47.6 37.5 - 51.0 %   MCV 85 79 - 97 fL   MCH 29.9 26.6 - 33.0 pg   MCHC 35.3 31.5 - 35.7 g/dL   RDW 13.6 12.3 - 15.4 %   Platelets 144  (L) 150 - 379 x10E3/uL   Neutrophils 52 %   Lymphs 34 %   Monocytes 11 %   Eos 2 %   Basos 1 %   Neutrophils Absolute 3.4 1.4 - 7.0 x10E3/uL   Lymphocytes Absolute 2.2 0.7 - 3.1 x10E3/uL   Monocytes Absolute 0.7 0.1 - 0.9 x10E3/uL   EOS (ABSOLUTE) 0.1 0.0 - 0.4 x10E3/uL   Basophils Absolute 0.0 0.0 - 0.2 x10E3/uL   Immature Granulocytes 0 %   Immature Grans (Abs) 0.0 0.0 - 0.1 x10E3/uL     Assessment & Plan  1. Muscle spasms of neck-secondary to fall  - meloxicam (MOBIC) 15 MG tablet; Take 1 tablet (15 mg total) by mouth daily.  Dispense: 30 tablet; Refill: 3 - metaxalone (SKELAXIN) 800 MG tablet; Take 1 tablet (800 mg total) by mouth 3 (three) times daily.  Dispense: 30 tablet; Refill: 2

## 2015-11-16 LAB — BASIC METABOLIC PANEL
BUN: 25 mg/dL (ref 7–25)
CALCIUM: 9.6 mg/dL (ref 8.6–10.3)
CO2: 27 mmol/L (ref 20–31)
CREATININE: 1.18 mg/dL (ref 0.70–1.18)
Chloride: 99 mmol/L (ref 98–110)
Glucose, Bld: 119 mg/dL — ABNORMAL HIGH (ref 65–99)
Potassium: 4.7 mmol/L (ref 3.5–5.3)
SODIUM: 137 mmol/L (ref 135–146)

## 2015-11-30 DIAGNOSIS — H2511 Age-related nuclear cataract, right eye: Secondary | ICD-10-CM | POA: Diagnosis not present

## 2015-12-01 LAB — HM DIABETES EYE EXAM

## 2015-12-05 ENCOUNTER — Other Ambulatory Visit: Payer: Self-pay | Admitting: Family Medicine

## 2015-12-14 ENCOUNTER — Ambulatory Visit: Payer: Medicare Other | Admitting: Family Medicine

## 2016-01-30 ENCOUNTER — Other Ambulatory Visit: Payer: Self-pay | Admitting: Family Medicine

## 2016-01-30 MED ORDER — LISINOPRIL 10 MG PO TABS: ORAL_TABLET | ORAL | 3 refills | Status: DC

## 2016-02-01 ENCOUNTER — Ambulatory Visit (INDEPENDENT_AMBULATORY_CARE_PROVIDER_SITE_OTHER): Payer: Medicare Other | Admitting: Family Medicine

## 2016-02-01 ENCOUNTER — Encounter: Payer: Self-pay | Admitting: Family Medicine

## 2016-02-01 VITALS — BP 140/80 | HR 60 | Temp 98.1°F | Resp 16 | Ht 70.0 in | Wt 172.6 lb

## 2016-02-01 DIAGNOSIS — I129 Hypertensive chronic kidney disease with stage 1 through stage 4 chronic kidney disease, or unspecified chronic kidney disease: Secondary | ICD-10-CM

## 2016-02-01 DIAGNOSIS — N183 Chronic kidney disease, stage 3 (moderate): Secondary | ICD-10-CM

## 2016-02-01 DIAGNOSIS — E785 Hyperlipidemia, unspecified: Secondary | ICD-10-CM | POA: Diagnosis not present

## 2016-02-01 DIAGNOSIS — E1142 Type 2 diabetes mellitus with diabetic polyneuropathy: Secondary | ICD-10-CM | POA: Diagnosis not present

## 2016-02-01 DIAGNOSIS — E1169 Type 2 diabetes mellitus with other specified complication: Secondary | ICD-10-CM

## 2016-02-01 LAB — POCT GLYCOSYLATED HEMOGLOBIN (HGB A1C): Hemoglobin A1C: 7.1

## 2016-02-01 MED ORDER — GABAPENTIN 600 MG PO TABS: ORAL_TABLET | ORAL | 12 refills | Status: DC

## 2016-02-01 NOTE — Progress Notes (Signed)
Name: [name]   MRN: [id number]    DOB: 1934/04/05   Date:02/01/2016       Progress Note  Subjective  Chief Complaint  Chief Complaint  Patient presents with  . Diabetes    Follow up  . Hypertension    Follow up    HPI Here for f/u of DM and HBP.  BSs at home run 140s fasting.  Still with peripheral neuropathy pain in feet.  Still with neck pain.  No problem-specific Assessment & Plan notes found for this encounter.   Past Medical History:  Diagnosis Date  . Diabetes mellitus without complication ([place])   . Hyperlipidemia   . Hypertension   . Neuropathy ([place])   . Prostate cancer [facility])     Past Surgical History:  Procedure Laterality Date  . CATARACT EXTRACTION W/PHACO Left 04/24/2015   Procedure: CATARACT EXTRACTION PHACO AND INTRAOCULAR LENS PLACEMENT (IOC);  Surgeon: Estill Cotta, MD;  Location: [facility] ORS;  Service: Ophthalmology;  Laterality: Left;  [place] 01:12 AP% 25.7 CDE 28.30 fluid pack lot [id number]  . COLONOSCOPY      Family History  Problem Relation Age of Onset  . Stroke Mother   . Cancer Mother     colon cancer  . COPD Father     Social History   Social History  . Marital status: Widowed    Spouse name: N/A  . Number of children: N/A  . Years of education: N/A   Occupational History  . Not on file.   Social History Main Topics  . Smoking status: Former Smoker    Quit date: 05/27/1978  . Smokeless tobacco: Never Used  . Alcohol use No  . Drug use: No  . Sexual activity: Not on file   Other Topics Concern  . Not on file   Social History Narrative  . No narrative on file     Current Outpatient Prescriptions:  .  dextromethorphan-guaiFENesin (MUCINEX DM) 30-600 MG 12hr tablet, Take 1 tablet by mouth 2 (two) times daily., Disp: 20 tablet, Rfl: 0 .  DUREZOL 0.05 % EMUL, Place [id number] application into the left eye as needed., Disp: , Rfl:  .  fluticasone (FLONASE) 50 MCG/ACT nasal spray, Place 2 sprays into both nostrils daily.,  Disp: 16 g, Rfl: 11 .  gabapentin (NEURONTIN) 600 MG tablet, Take 1 capsule 4 times a day., Disp: 120 tablet, Rfl: 12 .  glucosamine-chondroitin 500-400 MG tablet, Take 1 tablet by mouth 3 (three) times daily., Disp: , Rfl:  .  lisinopril (PRINIVIL,ZESTRIL) 10 MG tablet, 1 (one) Tablet, Oral, daily, Disp: 90 tablet, Rfl: 3 .  meloxicam (MOBIC) 15 MG tablet, Take 1 tablet (15 mg total) by mouth daily., Disp: 30 tablet, Rfl: 3 .  metaxalone (SKELAXIN) 800 MG tablet, Take 1 tablet (800 mg total) by mouth 3 (three) times daily., Disp: 30 tablet, Rfl: 2 .  metFORMIN (GLUCOPHAGE-XR) 500 MG 24 hr tablet, 1 Tablet ER 24HR, Oral, once daily, Disp: 90 tablet, Rfl: 3 .  naproxen (NAPROSYN) 375 MG tablet, Take 1 tablet (375 mg total) by mouth 2 (two) times daily with a meal., Disp: 30 tablet, Rfl: 0 .  ZETIA 10 MG tablet, 1 (one) Tablet Tablet, Oral, at bedtime, Disp: 90 tablet, Rfl: 0  Not on File   Review of Systems  Constitutional: Negative for chills, fever, malaise/fatigue and weight loss.  HENT: Negative for hearing loss.   Eyes: Negative for blurred vision and double vision.  Respiratory: Negative for cough, shortness  of breath and wheezing.   Cardiovascular: Negative for chest pain, palpitations and leg swelling.  Gastrointestinal: Negative for abdominal pain, blood in stool and heartburn.  Genitourinary: Negative for dysuria, frequency and urgency.  Musculoskeletal: Positive for neck pain.  Skin: Negative for rash.  Neurological: Positive for tingling (bilateral feet). Negative for weakness and headaches.      Objective  Vitals:   02/01/16 0900 02/01/16 1006  BP: 139/82 140/80  Pulse: 60   Resp: 16   Temp: 98.1 F (36.7 C)   TempSrc: Oral   Weight: 172 lb 9.6 oz (78.3 kg)   Height: 5\' 10"  (1.778 m)     Physical Exam  Constitutional: He is oriented to person, place, and time and well-developed, well-nourished, and in no distress. No distress.  HENT:  Head: Normocephalic and  atraumatic.  Eyes: Conjunctivae and EOM are normal. Pupils are equal, round, and reactive to light. No scleral icterus.  Neck: Normal range of motion. Neck supple. Carotid bruit is not present. No thyromegaly present.  Cardiovascular: Normal rate, regular rhythm and normal heart sounds.  Exam reveals no gallop and no friction rub.   No murmur heard. Pulmonary/Chest: Effort normal and breath sounds normal. No respiratory distress. He has no wheezes. He has no rales.  Abdominal: Soft. Bowel sounds are normal. He exhibits no distension and no mass. There is no tenderness.  Musculoskeletal: He exhibits no edema.  Lymphadenopathy:    He has no cervical adenopathy.  Neurological: He is alert and oriented to person, place, and time.  Vitals reviewed.      Recent Results (from the past 2160 hour(s))  Basic Metabolic Panel (BMET)     Status: Abnormal   Collection Time: 11/13/15 11:25 AM  Result Value Ref Range   Sodium 137 135 - 146 mmol/L   Potassium 4.7 3.5 - 5.3 mmol/L   Chloride 99 98 - 110 mmol/L   CO2 27 20 - 31 mmol/L   Glucose, Bld 119 (H) 65 - 99 mg/dL   BUN 25 7 - 25 mg/dL   Creat 1.18 0.70 - 1.18 mg/dL    Comment:   For patients > or = 80 years of age: The upper reference limit for Creatinine is approximately 13% higher for people identified as African-American.      Calcium 9.6 8.6 - 10.3 mg/dL  HM DIABETES EYE EXAM     Status: None   Collection Time: 12/01/15 12:00 AM  Result Value Ref Range   HM Diabetic Eye Exam No Retinopathy No Retinopathy     Assessment & Plan  Problem List Items Addressed This Visit      Cardiovascular and Mediastinum   Benign hypertension with CKD (chronic kidney disease) stage III - Primary     Endocrine   Type 2 diabetes mellitus with diabetic polyneuropathy (HCC)   Relevant Medications   gabapentin (NEURONTIN) 600 MG tablet     Other   Hyperlipidemia associated with type 2 diabetes mellitus ([place])    Other Visit Diagnoses    None.     Meds ordered this encounter  Medications  . gabapentin (NEURONTIN) 600 MG tablet    Sig: Take 1 capsule 4 times a day.    Dispense:  120 tablet    Refill:  12   1. Type 2 diabetes mellitus with diabetic polyneuropathy, without long-term current use of insulin (HCC) cont meds - gabapentin (NEURONTIN) 600 MG tablet; Take 1 capsule 4 times a day.  Dispense: 120 tablet; Refill: 12  2. Benign hypertension with CKD (chronic kidney disease) stage III Cont meds  3. Hyperlipidemia associated with type 2 diabetes mellitus (HCC) Cont meds

## 2016-02-01 NOTE — Addendum Note (Signed)
Addended by: Devona Konig on: 02/01/2016 11:12 AM   Modules accepted: Orders

## 2016-02-01 NOTE — Addendum Note (Signed)
Addended by: Devona Konig on: 02/01/2016 10:56 AM   Modules accepted: Orders

## 2016-03-04 DIAGNOSIS — H35373 Puckering of macula, bilateral: Secondary | ICD-10-CM | POA: Diagnosis not present

## 2016-03-04 DIAGNOSIS — H2510 Age-related nuclear cataract, unspecified eye: Secondary | ICD-10-CM | POA: Diagnosis not present

## 2016-03-04 LAB — HM DIABETES EYE EXAM

## 2016-03-20 DIAGNOSIS — Z23 Encounter for immunization: Secondary | ICD-10-CM | POA: Diagnosis not present

## 2016-05-13 ENCOUNTER — Ambulatory Visit (INDEPENDENT_AMBULATORY_CARE_PROVIDER_SITE_OTHER): Payer: Medicare Other | Admitting: Family Medicine

## 2016-05-13 ENCOUNTER — Encounter: Payer: Self-pay | Admitting: Family Medicine

## 2016-05-13 ENCOUNTER — Other Ambulatory Visit: Payer: Self-pay | Admitting: Family Medicine

## 2016-05-13 VITALS — BP 135/78 | HR 61 | Temp 98.0°F | Resp 16 | Ht 70.0 in | Wt <= 1120 oz

## 2016-05-13 DIAGNOSIS — E1169 Type 2 diabetes mellitus with other specified complication: Secondary | ICD-10-CM

## 2016-05-13 DIAGNOSIS — E785 Hyperlipidemia, unspecified: Secondary | ICD-10-CM

## 2016-05-13 DIAGNOSIS — G629 Polyneuropathy, unspecified: Secondary | ICD-10-CM | POA: Insufficient documentation

## 2016-05-13 DIAGNOSIS — G63 Polyneuropathy in diseases classified elsewhere: Secondary | ICD-10-CM

## 2016-05-13 DIAGNOSIS — I129 Hypertensive chronic kidney disease with stage 1 through stage 4 chronic kidney disease, or unspecified chronic kidney disease: Secondary | ICD-10-CM

## 2016-05-13 DIAGNOSIS — N183 Chronic kidney disease, stage 3 (moderate): Secondary | ICD-10-CM | POA: Diagnosis not present

## 2016-05-13 DIAGNOSIS — E1142 Type 2 diabetes mellitus with diabetic polyneuropathy: Secondary | ICD-10-CM

## 2016-05-13 DIAGNOSIS — E119 Type 2 diabetes mellitus without complications: Secondary | ICD-10-CM | POA: Diagnosis not present

## 2016-05-13 DIAGNOSIS — M62838 Other muscle spasm: Secondary | ICD-10-CM

## 2016-05-13 LAB — POCT GLYCOSYLATED HEMOGLOBIN (HGB A1C): Hemoglobin A1C: 6.6

## 2016-05-13 MED ORDER — GABAPENTIN 600 MG PO TABS: ORAL_TABLET | ORAL | 12 refills | Status: DC

## 2016-05-13 NOTE — Progress Notes (Signed)
Name: [name]   MRN: [id number]    DOB: 1933-06-16   Date:05/13/2016       Progress Note  Subjective  Chief Complaint  Chief Complaint  Patient presents with  . Diabetes  . Hypertension    HPI Here for f/u of DM and HBP.  Also with diabetic neuropathy and neck pain.  Neuropathy is still bothering him still.  BSs at home 114-140s.  No problem-specific Assessment & Plan notes found for this encounter.   Past Medical History:  Diagnosis Date  . Diabetes mellitus without complication ([place])   . Hyperlipidemia   . Hypertension   . Neuropathy ([employer])   . Prostate cancer [facility])     Past Surgical History:  Procedure Laterality Date  . CATARACT EXTRACTION W/PHACO Left 04/24/2015   Procedure: CATARACT EXTRACTION PHACO AND INTRAOCULAR LENS PLACEMENT (IOC);  Surgeon: Estill Cotta, MD;  Location: [facility] ORS;  Service: Ophthalmology;  Laterality: Left;  [place] 01:12 AP% 25.7 CDE 28.30 fluid pack lot [id number] H  . COLONOSCOPY      Family History  Problem Relation Age of Onset  . Stroke Mother   . Cancer Mother     colon cancer  . COPD Father     Social History   Social History  . Marital status: Widowed    Spouse name: N/A  . Number of children: N/A  . Years of education: N/A   Occupational History  . Not on file.   Social History Main Topics  . Smoking status: Former Smoker    Quit date: 05/27/1978  . Smokeless tobacco: Never Used  . Alcohol use No  . Drug use: No  . Sexual activity: Not on file   Other Topics Concern  . Not on file   Social History Narrative  . No narrative on file     Current Outpatient Prescriptions:  Marland Kitchen  DUREZOL 0.05 % EMUL, Place [id number] application into the left eye as needed., Disp: , Rfl:  .  fluticasone (FLONASE) 50 MCG/ACT nasal spray, Place 2 sprays into both nostrils daily., Disp: 16 g, Rfl: 11 .  gabapentin (NEURONTIN) 600 MG tablet, Take 1 capsule 4 times a day., Disp: 120 tablet, Rfl: 12 .  glucosamine-chondroitin 500-400 MG  tablet, Take 1 tablet by mouth 3 (three) times daily as needed. , Disp: , Rfl:  .  lisinopril (PRINIVIL,ZESTRIL) 10 MG tablet, 1 (one) Tablet, Oral, daily, Disp: 90 tablet, Rfl: 3 .  meloxicam (MOBIC) 15 MG tablet, Take 1 tablet (15 mg total) by mouth daily., Disp: 30 tablet, Rfl: 3 .  metFORMIN (GLUCOPHAGE-XR) 500 MG 24 hr tablet, 1 Tablet ER 24HR, Oral, once daily, Disp: 90 tablet, Rfl: 3 .  ZETIA 10 MG tablet, 1 (one) Tablet Tablet, Oral, at bedtime, Disp: 90 tablet, Rfl: 0 .  dextromethorphan-guaiFENesin (MUCINEX DM) 30-600 MG 12hr tablet, Take 1 tablet by mouth 2 (two) times daily. (Patient not taking: Reported on 05/13/2016), Disp: 20 tablet, Rfl: 0  Not on File   Review of Systems  Constitutional: Negative for chills, fever, malaise/fatigue and weight loss.  HENT: Negative for hearing loss and tinnitus.   Eyes: Positive for blurred vision. Negative for double vision.       Needs further cataract surgery.  Respiratory: Negative for cough, shortness of breath and wheezing.   Cardiovascular: Negative for chest pain, palpitations and leg swelling.  Gastrointestinal: Negative for abdominal pain, blood in stool and heartburn.  Genitourinary: Negative for dysuria, frequency and urgency.  Musculoskeletal: Negative for back  pain and myalgias.  Skin: Negative for rash.  Neurological: Positive for tingling. Negative for dizziness, tremors, weakness and headaches.       Peripheral stinging/burning.      Objective  Vitals:   05/13/16 0822  BP: 135/78  Pulse: 61  Resp: 16  Temp: 98 F (36.7 C)  TempSrc: Oral  Weight: 68 lb (30.8 kg)  Height: 5\' 10"  (1.778 m)    Physical Exam  Constitutional: He is oriented to person, place, and time and well-developed, well-nourished, and in no distress. No distress.  HENT:  Head: Normocephalic and atraumatic.  Eyes: Conjunctivae and EOM are normal. Pupils are equal, round, and reactive to light. No scleral icterus.  Neck: Normal range of  motion. Neck supple. Carotid bruit is not present. No thyromegaly present.  Cardiovascular: Normal rate, regular rhythm and normal heart sounds.  Exam reveals no gallop and no friction rub.   No murmur heard. Pulmonary/Chest: Effort normal and breath sounds normal. No respiratory distress. He has no wheezes. He has no rales.  Musculoskeletal: He exhibits no edema.  Lymphadenopathy:    He has no cervical adenopathy.  Neurological: He is alert and oriented to person, place, and time.  Mild peripheral neuropathy bilateral feet.  Skin: Skin is warm and dry.  Vitals reviewed.      Recent Results (from the past 2160 hour(s))  HM DIABETES EYE EXAM     Status: None   Collection Time: 03/04/16 10:32 AM  Result Value Ref Range   HM Diabetic Eye Exam No Retinopathy No Retinopathy  POCT HgB A1C     Status: Abnormal   Collection Time: 05/13/16  8:53 AM  Result Value Ref Range   Hemoglobin A1C 6.6%      Assessment & Plan  Problem List Items Addressed This Visit      Cardiovascular and Mediastinum   Benign hypertension with CKD (chronic kidney disease) stage III     Endocrine   Type 2 diabetes mellitus with diabetic polyneuropathy (HCC)   Relevant Medications   gabapentin (NEURONTIN) 600 MG tablet   Other Relevant Orders   COMPLETE METABOLIC PANEL WITH GFR   Hyperlipidemia associated with type 2 diabetes mellitus (HCC)   Relevant Orders   Lipid Profile     Nervous and Auditory   Peripheral neuropathy (HCC)   Relevant Medications   gabapentin (NEURONTIN) 600 MG tablet   Other Relevant Orders   CBC with Differential    Other Visit Diagnoses    Diabetes mellitus without complication ([place])    -  Primary   Relevant Orders   POCT HgB A1C (Completed)      Meds ordered this encounter  Medications  . gabapentin (NEURONTIN) 600 MG tablet    Sig: Take 1 capsule 4 times a day.    Dispense:  120 tablet    Refill:  12   1. Diabetes mellitus without complication (HCC)  - POCT  HgB A1C-6.6  2. Type 2 diabetes mellitus with diabetic polyneuropathy, unspecified long term insulin use status (HCC) Cont Metformin and increase Gabapentin to 4 times a day. - COMPLETE METABOLIC PANEL WITH GFR  3. Benign hypertension with CKD (chronic kidney disease) stage III Cont Lisinopril  4. Hyperlipidemia associated with type 2 diabetes mellitus ([place]) Cont Zetia. - Lipid Profile  5. Polyneuropathy associated with underlying disease ([place])  - CBC with Differential  6. Type 2 diabetes mellitus with diabetic polyneuropathy, without long-term current use of insulin (HCC)  - gabapentin (NEURONTIN) 600 MG tablet;  Take 1 capsule 4 times a day.  Dispense: 120 tablet; Refill: 12

## 2016-05-28 ENCOUNTER — Other Ambulatory Visit: Payer: Medicare Other

## 2016-06-10 DIAGNOSIS — H26492 Other secondary cataract, left eye: Secondary | ICD-10-CM | POA: Diagnosis not present

## 2016-06-25 ENCOUNTER — Telehealth: Payer: Self-pay | Admitting: *Deleted

## 2016-06-25 ENCOUNTER — Other Ambulatory Visit: Payer: Self-pay | Admitting: Family Medicine

## 2016-06-25 ENCOUNTER — Other Ambulatory Visit: Payer: Medicare Other

## 2016-06-25 DIAGNOSIS — E1142 Type 2 diabetes mellitus with diabetic polyneuropathy: Secondary | ICD-10-CM | POA: Diagnosis not present

## 2016-06-25 DIAGNOSIS — E1169 Type 2 diabetes mellitus with other specified complication: Secondary | ICD-10-CM | POA: Diagnosis not present

## 2016-06-25 DIAGNOSIS — E785 Hyperlipidemia, unspecified: Secondary | ICD-10-CM | POA: Diagnosis not present

## 2016-06-25 DIAGNOSIS — G63 Polyneuropathy in diseases classified elsewhere: Secondary | ICD-10-CM | POA: Diagnosis not present

## 2016-06-25 NOTE — Telephone Encounter (Signed)
Patient dropped off form from [facility]. He will need to schedule a physical to have this from completed. Patient has pending appt in April with Dr. Raliegh Ip. We will check to see if this can be done then. If not, patient will be contacted to schedule a physical. The form will be filed in patient form folder.

## 2016-06-26 LAB — COMPLETE METABOLIC PANEL WITH GFR
ALBUMIN: 4.4 g/dL (ref 3.6–5.1)
ALK PHOS: 42 U/L (ref 40–115)
ALT: 9 U/L (ref 9–46)
AST: 16 U/L (ref 10–35)
BUN: 25 mg/dL (ref 7–25)
CALCIUM: 9.6 mg/dL (ref 8.6–10.3)
CHLORIDE: 100 mmol/L (ref 98–110)
CO2: 31 mmol/L (ref 20–31)
Creat: 1.23 mg/dL — ABNORMAL HIGH (ref 0.70–1.11)
GFR, EST AFRICAN AMERICAN: 64 mL/min (ref >=60)
GFR, Est Non African American: 55 mL/min — ABNORMAL LOW (ref >=60)
Glucose, Bld: 120 mg/dL — ABNORMAL HIGH (ref 65–99)
POTASSIUM: 5 mmol/L (ref 3.5–5.3)
Sodium: 139 mmol/L (ref 135–146)
Total Bilirubin: 0.5 mg/dL (ref 0.2–1.2)
Total Protein: 7.2 g/dL (ref 6.1–8.1)

## 2016-06-26 LAB — CBC WITH DIFFERENTIAL/PLATELET
BASOS PCT: 1 %
Basophils Absolute: 58 cells/uL (ref 0–200)
EOS PCT: 2 %
Eosinophils Absolute: 116 cells/uL (ref 15–500)
HEMATOCRIT: 47.3 % (ref 38.5–50.0)
Hemoglobin: 16.5 g/dL (ref 13.2–17.1)
LYMPHS ABS: 2378 {cells}/uL (ref 850–3900)
LYMPHS PCT: 41 %
MCH: 29.8 pg (ref 27.0–33.0)
MCHC: 34.9 g/dL (ref 32.0–36.0)
MCV: 85.4 fL (ref 80.0–100.0)
MONO ABS: 580 {cells}/uL (ref 200–950)
MPV: 10 fL (ref 7.5–12.5)
Monocytes Relative: 10 %
NEUTROS PCT: 46 %
Neutro Abs: 2668 cells/uL (ref 1500–7800)
Platelets: 148 10*3/uL (ref 140–400)
RBC: 5.54 MIL/uL (ref 4.20–5.80)
RDW: 13.8 % (ref 11.0–15.0)
WBC: 5.8 10*3/uL (ref 3.8–10.8)

## 2016-06-26 LAB — LIPID PANEL
CHOLESTEROL: 199 mg/dL (ref <200)
HDL: 31 mg/dL — AB (ref >40)
LDL Cholesterol: 117 mg/dL — ABNORMAL HIGH (ref <100)
TRIGLYCERIDES: 254 mg/dL — AB (ref <150)
Total CHOL/HDL Ratio: 6.4 Ratio — ABNORMAL HIGH (ref <5.0)
VLDL: 51 mg/dL — ABNORMAL HIGH (ref <30)

## 2016-08-13 ENCOUNTER — Other Ambulatory Visit: Payer: Self-pay | Admitting: Family Medicine

## 2016-08-13 DIAGNOSIS — M62838 Other muscle spasm: Secondary | ICD-10-CM

## 2016-09-16 ENCOUNTER — Ambulatory Visit (INDEPENDENT_AMBULATORY_CARE_PROVIDER_SITE_OTHER): Payer: Medicare Other | Admitting: Family Medicine

## 2016-09-16 ENCOUNTER — Encounter: Payer: Self-pay | Admitting: Family Medicine

## 2016-09-16 VITALS — BP 142/78 | HR 56 | Temp 98.0°F | Resp 16 | Ht 70.0 in | Wt 166.0 lb

## 2016-09-16 DIAGNOSIS — E785 Hyperlipidemia, unspecified: Secondary | ICD-10-CM | POA: Diagnosis not present

## 2016-09-16 DIAGNOSIS — I129 Hypertensive chronic kidney disease with stage 1 through stage 4 chronic kidney disease, or unspecified chronic kidney disease: Secondary | ICD-10-CM | POA: Diagnosis not present

## 2016-09-16 DIAGNOSIS — E1142 Type 2 diabetes mellitus with diabetic polyneuropathy: Secondary | ICD-10-CM | POA: Diagnosis not present

## 2016-09-16 DIAGNOSIS — E1169 Type 2 diabetes mellitus with other specified complication: Secondary | ICD-10-CM

## 2016-09-16 DIAGNOSIS — N183 Chronic kidney disease, stage 3 (moderate): Secondary | ICD-10-CM

## 2016-09-16 LAB — POCT GLYCOSYLATED HEMOGLOBIN (HGB A1C): Hemoglobin A1C: 6.6

## 2016-09-16 MED ORDER — ZETIA 10 MG PO TABS: 10.0000 mg | ORAL_TABLET | Freq: Every day | ORAL | 3 refills | Status: DC

## 2016-09-16 NOTE — Progress Notes (Signed)
Subjective:    Patient ID: [name], male    DOB: Apr 05, 1934, 83 y.o.   MRN: [id number]  [name] is a 83 y.o. male presenting on 09/16/2016 for Diabetes   HPI   CHRONIC DM, Type 2 with DM Neuropathy: Reports no concerns. A1c today 6.6, has been stable over past 2 years. CBGs: avg 1 x weekly, has not been adhering to this regularly Meds: Metformin XR 500mg  daily Reports good compliance. Tolerating well w/o side-effects Currently on ACEi Lifestyle: Diet (lives at a retirement center, tries to eat lower carb diet, turns down dessert most nights to avoid excess sugar) / Exercise (some walking, working with moving into new retirement community independent apartment, previously going to gym locally with treadmill, may start using gym at apartment) - Taking Gabapentin 600mg  up to 4 times day (recent increase over several months), cannot tell any difference, still has stinging and burning sensation in bottom of both feet, worse in toes bilaterally, tried an OTC numbing cream with some mild relief, has not seen podiatry, had considered Lyrica from other provider in past Denies hypoglycemia, polyuria, visual changes  HYPERLIPIDEMIA: - Reports no concerns. Last lipid panel 05/2016 with abnormal results. - Currently taking Zetia 10mg , tolerating well without side effects or myalgias. Chart review shows he was on prior statin, unsure which one but had intolerance  CHRONIC HTN: Reports does not check BP outside office. Current Meds - Lisinopril 10mg    Reports good compliance, took meds today. Tolerating well, w/o complaints. Denies CP, dyspnea, HA, edema, dizziness / lightheadedness    Social History  Substance Use Topics  . Smoking status: Former Smoker    Quit date: 05/27/1978  . Smokeless tobacco: Never Used  . Alcohol use No    Review of Systems Per HPI unless specifically indicated above     Objective:    BP (!) 142/78 (BP Location: Left Arm, Cuff Size: Normal)    Pulse (!) 56   Temp 98 F (36.7 C) (Oral)   Resp 16   Ht 5\' 10"  (1.778 m)   Wt 166 lb (75.3 kg)   BMI 23.82 kg/m   Wt Readings from Last 3 Encounters:  09/16/16 166 lb (75.3 kg)  05/13/16 68 lb (30.8 kg)  02/01/16 172 lb 9.6 oz (78.3 kg)    Physical Exam  Constitutional: He is oriented to person, place, and time. He appears well-developed and well-nourished. No distress.  Well-appearing, comfortable, cooperative  HENT:  Head: Normocephalic and atraumatic.  Mouth/Throat: Oropharynx is clear and moist.  Eyes: Conjunctivae are normal.  Neck: Normal range of motion. Neck supple. No thyromegaly present.  No carotid bruits  Cardiovascular: Normal rate, regular rhythm, normal heart sounds and intact distal pulses.   No murmur heard. Pulmonary/Chest: Effort normal and breath sounds normal. No respiratory distress. He has no wheezes. He has no rales.  Lymphadenopathy:    He has no cervical adenopathy.  Neurological: He is alert and oriented to person, place, and time.  Skin: Skin is warm and dry. He is not diaphoretic.  Psychiatric: He has a normal mood and affect. His behavior is normal.  Nursing note and vitals reviewed.   Diabetic Foot Exam - Simple   Simple Foot Form Diabetic Foot exam was performed with the following findings:  Yes 09/16/2016  9:48 AM  Visual Inspection No deformities, no ulcerations, no other skin breakdown bilaterally:  Yes Sensation Testing Intact to touch and monofilament testing bilaterally:  Yes Pulse Check Posterior Tibialis  and Dorsalis pulse intact bilaterally:  Yes Comments     Results for orders placed or performed in visit on 09/16/16  POCT HgB A1C  Result Value Ref Range   Hemoglobin A1C 6.6       Assessment & Plan:   Problem List Items Addressed This Visit    Type 2 diabetes mellitus with diabetic polyneuropathy ([place]) - Primary    Well-controlled DM, A1c 6.6, remains < 7.0 x 2 years now Complications - peripheral neuropathy No  hypoglycemia, mostly with lifestyle and on minimal med  Plan:  1. Continue current therapy Metformin XR 500mg  daily 2. Lifestyle Mods - Continue to improve DM diet (dec carbs, inc vegs), and resume regular exercise, walking 3. DM foot exam done today, mostly normal sensation 4. Advised increase Gabapentin dose from 600 QID to 900 TID, slight increase from 2400mg  daily to 2700mg  total daily dose, reviewed other options in future such as podiatry referral for second opinion, can transition to Lyrica next visit 5. On ACE - not ASA or statin, counseling on ASCVD risk reduction on these, consider in future 6. Follow-up 3 months DM A1c       Relevant Medications   gabapentin (NEURONTIN) 600 MG tablet   Other Relevant Orders   POCT HgB A1C (Completed)   Hyperlipidemia associated with type 2 diabetes mellitus ([place])    Last lipids 05/2016 not controlled, mixed dyslipidemia On Zetia 10mg  daily H/o statin intolerance unsure which one tried  Plan: 1. Counseling on ASCVD risk elevated with DM2 and age 68, HTN, recommend starting statin therapy, and re-try can use low dose or in future may need infrequent dosing, recommend Rosuvastatin likely 5-10mg  to start nightly, then can switch to 3 x week dosing if needed, patient declines now will consider in future, would add to Zetia 2. Check lipids yearly, 05/2017      Relevant Medications   ZETIA 10 MG tablet   Benign hypertension with CKD (chronic kidney disease) stage III    Mild elevated BP, but near goal < 140/90 for DM patient, also age 18 goal < 845/36 Complication: CKD-III, stable  Plan: 1. Continue ACEi Lisinopril 10mg  daily for HTN/CKD/DM 2. Improve hydration, lifestyle regular exercise 3. Monitor BP outside office if he can occasionally 4. Follow-up 3 months      Relevant Medications   ZETIA 10 MG tablet      Meds ordered this encounter  Medications  . gabapentin (NEURONTIN) 600 MG tablet    Sig: Take 1.5 tablets (900 mg total) by  mouth 3 (three) times daily.    Dispense:  120 tablet    Refill:  12  . ZETIA 10 MG tablet    Sig: Take 1 tablet (10 mg total) by mouth at bedtime.    Dispense:  90 tablet    Refill:  3      Follow up plan: Return in about 3 months (around 12/16/2016) for diabetes, blood pressure.  Nobie Putnam, [facility] 09/17/2016, 5:53 AM

## 2016-09-16 NOTE — Patient Instructions (Signed)
Thank you for coming in to clinic today.  1.  Diabetes is very well controlled, A1c 6.6 Keep up the good work Low carb diet low sugar Stay active and resume plan to work out regularly  2. BP is elevated today - Check BP outside office at pharmacy or [place], write down readings and bring back to me to review, if continues to be elevated >140 then we may need to adjust medication  3. Refilled Zetia for cholesterol daily Please consider trying a Statin medication again in the future - Rosuvastatin (Crestor) is one of my preferred medications if you would like to try this again, we try lower dose and infrequent dosing up to 3 times a week only if you have muscle aches and joint pain to help reduce these side effects  Also please consider taking a baby aspirin 81mg  daily for reducing risk of heart attack and stroke  4. Gabapentin, dose increase - SWITCH TO 3 times daily, NOT 4 times - Take ONE and HALF tablet (dose = 900) - Use existing rx for now - In future we can change rx or if not helping switch to Lyrica if needed  Please schedule a follow-up appointment with Dr. Parks Ranger in 3 months for Diabetes A1c, HTN, HLD  If you have any other questions or concerns, please feel free to call the clinic or send a message through [place]. You may also schedule an earlier appointment if necessary.  Nobie Putnam, DO [place]

## 2016-09-17 NOTE — Assessment & Plan Note (Addendum)
Last lipids 05/2016 not controlled, mixed dyslipidemia On Zetia 10mg  daily H/o statin intolerance unsure which one tried  Plan: 1. Counseling on ASCVD risk elevated with DM2 and age 83, HTN, recommend starting statin therapy, and re-try can use low dose or in future may need infrequent dosing, recommend Rosuvastatin likely 5-10mg  to start nightly, then can switch to 3 x week dosing if needed, patient declines now will consider in future, would add to Zetia 2. Check lipids yearly, 05/2017

## 2016-09-17 NOTE — Assessment & Plan Note (Signed)
Mild elevated BP, but near goal < 140/90 for DM patient, also age 84 goal < 825/74 Complication: CKD-III, stable  Plan: 1. Continue ACEi Lisinopril 10mg  daily for HTN/CKD/DM 2. Improve hydration, lifestyle regular exercise 3. Monitor BP outside office if he can occasionally 4. Follow-up 3 months

## 2016-09-17 NOTE — Assessment & Plan Note (Signed)
Well-controlled DM, A1c 6.6, remains < 7.0 x 2 years now Complications - peripheral neuropathy No hypoglycemia, mostly with lifestyle and on minimal med  Plan:  1. Continue current therapy Metformin XR 500mg  daily 2. Lifestyle Mods - Continue to improve DM diet (dec carbs, inc vegs), and resume regular exercise, walking 3. DM foot exam done today, mostly normal sensation 4. Advised increase Gabapentin dose from 600 QID to 900 TID, slight increase from 2400mg  daily to 2700mg  total daily dose, reviewed other options in future such as podiatry referral for second opinion, can transition to Lyrica next visit 5. On ACE - not ASA or statin, counseling on ASCVD risk reduction on these, consider in future 6. Follow-up 3 months DM A1c

## 2016-10-11 ENCOUNTER — Telehealth: Payer: Self-pay | Admitting: Family Medicine

## 2016-10-22 ENCOUNTER — Ambulatory Visit (INDEPENDENT_AMBULATORY_CARE_PROVIDER_SITE_OTHER): Payer: Medicare Other

## 2016-10-22 VITALS — BP 148/68 | HR 62 | Temp 98.0°F | Resp 16 | Ht 70.0 in | Wt 168.6 lb

## 2016-10-22 DIAGNOSIS — Z0000 Encounter for general adult medical examination without abnormal findings: Secondary | ICD-10-CM | POA: Diagnosis not present

## 2016-10-22 NOTE — Progress Notes (Signed)
Subjective:   [name] is a 83 y.o. male who presents for an Initial Medicare Annual Wellness Visit.  Review of Systems      Objective:    Today's Vitals   10/22/16 1346  BP: (!) 148/68  Pulse: 62  Resp: 16  Temp: 98 F (36.7 C)  Weight: 168 lb 9.6 oz (76.5 kg)  Height: 5\' 10"  (1.778 m)   Body mass index is 24.19 kg/m.  Current Medications (verified) Outpatient Encounter Prescriptions as of 10/22/2016  Medication Sig  . DUREZOL 0.05 % EMUL Place 1.24 application into the left eye as needed.  . fluticasone (FLONASE) 50 MCG/ACT nasal spray Place 2 sprays into both nostrils daily.  Marland Kitchen gabapentin (NEURONTIN) 600 MG tablet Take 1.5 tablets (900 mg total) by mouth 3 (three) times daily.  Marland Kitchen glucosamine-chondroitin 500-400 MG tablet Take 1 tablet by mouth 3 (three) times daily as needed.   Marland Kitchen lisinopril (PRINIVIL,ZESTRIL) 10 MG tablet 1 (one) Tablet, Oral, daily  . metFORMIN (GLUCOPHAGE-XR) 500 MG 24 hr tablet 1 Tablet ER 24HR, Oral, once daily  . VOLTAREN 1 % GEL 4 GRAMS, Transdermal, four times daily, as needed  . ZETIA 10 MG tablet Take 1 tablet (10 mg total) by mouth at bedtime.   No facility-administered encounter medications on file as of 10/22/2016.     Allergies (verified) Patient has no known allergies.   History: Past Medical History:  Diagnosis Date  . Diabetes mellitus without complication ([place])   . Hyperlipidemia   . Hypertension   . Neuropathy   . Prostate cancer [facility])    Past Surgical History:  Procedure Laterality Date  . CATARACT EXTRACTION W/PHACO Left 04/24/2015   Procedure: CATARACT EXTRACTION PHACO AND INTRAOCULAR LENS PLACEMENT (IOC);  Surgeon: Estill Cotta, MD;  Location: [facility] ORS;  Service: Ophthalmology;  Laterality: Left;  [place] 01:12 AP% 25.7 CDE 28.30 fluid pack lot #[id number]  . COLONOSCOPY     Family History  Problem Relation Age of Onset  . Stroke Mother   . Cancer Mother        colon cancer  . COPD Father   . COPD Brother    . Hearing loss Son    Social History   Occupational History  . Not on file.   Social History Main Topics  . Smoking status: Former Smoker    Quit date: 05/27/1978  . Smokeless tobacco: Never Used  . Alcohol use No  . Drug use: No  . Sexual activity: Not on file   Tobacco Counseling Counseling given: Not Answered   Activities of Daily Living In your present state of health, do you have any difficulty performing the following activities: 10/22/2016  Hearing? N  Vision? Y  Difficulty concentrating or making decisions? N  Walking or climbing stairs? N  Dressing or bathing? N  Doing errands, shopping? N  Preparing Food and eating ? N  Using the Toilet? N  In the past six months, have you accidently leaked urine? N  Do you have problems with loss of bowel control? N  Managing your Medications? N  Managing your Finances? N  Housekeeping or managing your Housekeeping? N  Some recent data might be hidden    Immunizations and Health Maintenance Immunization History  Administered Date(s) Administered  . Influenza, High Dose Seasonal PF 03/20/2016  . Influenza-Unspecified 03/23/2015  . Pneumococcal Conjugate-13 03/20/2016  . Pneumococcal Polysaccharide-23 05/07/2013   There are no preventive care reminders to display for this patient.  Patient Care Team:  Karamalegos, Devonne Doughty, DO as PCP - General (Family Medicine)  Indicate any recent Medical Services you may have received from other than [facility] providers in the past year (date may be approximate).    Assessment:   This is a routine wellness examination for [name].   Hearing/Vision screen Vision Screening Comments: Sees Dr.Woodard annually  Dietary issues and exercise activities discussed: Current Exercise Habits: Home exercise routine, Type of exercise: walking, Time (Minutes): 30, Frequency (Times/Week): 3, Weekly Exercise (Minutes/Week): 90, Intensity: Mild  Goals    . Increase water intake          Recommend  drinking 3-4 glasses of water a day      Depression Screen PHQ 2/9 Scores 10/22/2016 10/22/2016 09/16/2016 08/31/2015  PHQ - 2 Score 0 0 0 0  PHQ- 9 Score 0 - - -    Fall Risk Fall Risk  10/22/2016 09/16/2016 08/31/2015 01/19/2015  Falls in the past year? Yes No No No  Number falls in past yr: 1 - - -  Injury with Fall? Yes - - -  Risk Factor Category  High Fall Risk - - -  Follow up Falls prevention discussed - - -    Cognitive Function:     6CIT Screen 10/22/2016  What Year? 0 points  What month? 0 points  What time? 0 points  Count back from 20 0 points  Months in reverse 0 points  Repeat phrase 2 points  Total Score 2    Screening Tests Health Maintenance  Topic Date Due  . INFLUENZA VACCINE  12/25/2016  . OPHTHALMOLOGY EXAM  03/04/2017  . HEMOGLOBIN A1C  03/18/2017  . FOOT EXAM  09/16/2017  . TETANUS/TDAP  06/24/2024  . PNA vac Low Risk Adult  Completed        Plan:  I have personally reviewed and addressed the Medicare Annual Wellness questionnaire and have noted the following in the patient's chart:  A. Medical and social history B. Use of alcohol, tobacco or illicit drugs  C. Current medications and supplements D. Functional ability and status E.  Nutritional status F.  Physical activity G. Advance directives H. List of other physicians I.  Hospitalizations, surgeries, and ER visits in previous 12 months J.  [place] such as hearing and vision if needed, cognitive and depression L. Referrals and appointments - 12/20/16 9:20am  In addition, I have reviewed and discussed with patient certain preventive protocols, quality metrics, and best practice recommendations. A written personalized care plan for preventive services as well as general preventive health recommendations were provided to patient.   Signed,  Tyler Aas, LPN Nurse Health Advisor   MD Recommendations: none

## 2016-10-22 NOTE — Patient Instructions (Addendum)
[name] , Thank you for taking time to come for your Medicare Wellness Visit. I appreciate your ongoing commitment to your health goals. Please review the following plan we discussed and let me know if I can assist you in the future.   Screening recommendations/referrals: Colonoscopy: no longer required Recommended yearly ophthalmology/optometry visit for glaucoma screening and checkup Recommended yearly dental visit for hygiene and checkup  Vaccinations: Influenza vaccine: up to date, due 02/2017 Pneumococcal vaccine: completed series Tdap vaccine: up to date Shingles vaccine: Had 2-3 years ago   Advanced directives: Advance directive discussed with you today. I have provided a copy for you to complete at home and have notarized. Once this is complete please bring a copy in to our office so we can scan it into your chart.  Conditions/risks identified: Recommend drinking 3-4 glasses of water a day  Next appointment: Follow up with Dr.K on 12/20/2016 at 9:20am. Follow up in one year for your annual wellness exam.   Preventive Care 65 Years and Older, Male Preventive care refers to lifestyle choices and visits with your health care provider that can promote health and wellness. What does preventive care include?  A yearly physical exam. This is also called an annual well check.  Dental exams once or twice a year.  Routine eye exams. Ask your health care provider how often you should have your eyes checked.  Personal lifestyle choices, including:  Daily care of your teeth and gums.  Regular physical activity.  Eating a healthy diet.  Avoiding tobacco and drug use.  Limiting alcohol use.  Practicing safe sex.  Taking low doses of aspirin every day.  Taking vitamin and mineral supplements as recommended by your health care provider. What happens during an annual well check? The services and screenings done by your health care provider during your annual well check will  depend on your age, overall health, lifestyle risk factors, and family history of disease. Counseling  Your health care provider may ask you questions about your:  Alcohol use.  Tobacco use.  Drug use.  Emotional well-being.  Home and relationship well-being.  Sexual activity.  Eating habits.  History of falls.  Memory and ability to understand (cognition).  Work and work Statistician. Screening  You may have the following tests or measurements:  Height, weight, and BMI.  Blood pressure.  Lipid and cholesterol levels. These may be checked every 5 years, or more frequently if you are over 31 years old.  Skin check.  Lung cancer screening. You may have this screening every year starting at age 41 if you have a 30-pack-year history of smoking and currently smoke or have quit within the past 15 years.  Fecal occult blood test (FOBT) of the stool. You may have this test every year starting at age 16.  Flexible sigmoidoscopy or colonoscopy. You may have a sigmoidoscopy every 5 years or a colonoscopy every 10 years starting at age 68.  Prostate cancer screening. Recommendations will vary depending on your family history and other risks.  Hepatitis C blood test.  Hepatitis B blood test.  Sexually transmitted disease (STD) testing.  Diabetes screening. This is done by checking your blood sugar (glucose) after you have not eaten for a while (fasting). You may have this done every 1-3 years.  Abdominal aortic aneurysm (AAA) screening. You may need this if you are a current or former smoker.  Osteoporosis. You may be screened starting at age 63 if you are at high risk. Talk  with your health care provider about your test results, treatment options, and if necessary, the need for more tests. Vaccines  Your health care provider may recommend certain vaccines, such as:  Influenza vaccine. This is recommended every year.  Tetanus, diphtheria, and acellular pertussis (Tdap,  Td) vaccine. You may need a Td booster every 10 years.  Zoster vaccine. You may need this after age 75.  Pneumococcal 13-valent conjugate (PCV13) vaccine. One dose is recommended after age 69.  Pneumococcal polysaccharide (PPSV23) vaccine. One dose is recommended after age 27. Talk to your health care provider about which screenings and vaccines you need and how often you need them. This information is not intended to replace advice given to you by your health care provider. Make sure you discuss any questions you have with your health care provider. Document Released: 06/09/2015 Document Revised: 01/31/2016 Document Reviewed: 03/14/2015 Elsevier Interactive Patient Education  2017 [employer] Prevention in the Home Falls can cause injuries. They can happen to people of all ages. There are many things you can do to make your home safe and to help prevent falls. What can I do on the outside of my home?  Regularly fix the edges of walkways and driveways and fix any cracks.  Remove anything that might make you trip as you walk through a door, such as a raised step or threshold.  Trim any bushes or trees on the path to your home.  Use bright outdoor lighting.  Clear any walking paths of anything that might make someone trip, such as rocks or tools.  Regularly check to see if handrails are loose or broken. Make sure that both sides of any steps have handrails.  Any raised decks and porches should have guardrails on the edges.  Have any leaves, snow, or ice cleared regularly.  Use sand or salt on walking paths during winter.  Clean up any spills in your garage right away. This includes oil or grease spills. What can I do in the bathroom?  Use night lights.  Install grab bars by the toilet and in the tub and shower. Do not use towel bars as grab bars.  Use non-skid mats or decals in the tub or shower.  If you need to sit down in the shower, use a plastic, non-slip  stool.  Keep the floor dry. Clean up any water that spills on the floor as soon as it happens.  Remove soap buildup in the tub or shower regularly.  Attach bath mats securely with double-sided non-slip rug tape.  Do not have throw rugs and other things on the floor that can make you trip. What can I do in the bedroom?  Use night lights.  Make sure that you have a light by your bed that is easy to reach.  Do not use any sheets or blankets that are too big for your bed. They should not hang down onto the floor.  Have a firm chair that has side arms. You can use this for support while you get dressed.  Do not have throw rugs and other things on the floor that can make you trip. What can I do in the kitchen?  Clean up any spills right away.  Avoid walking on wet floors.  Keep items that you use a lot in easy-to-reach places.  If you need to reach something above you, use a strong step stool that has a grab bar.  Keep electrical cords out of the way.  Do  not use floor polish or wax that makes floors slippery. If you must use wax, use non-skid floor wax.  Do not have throw rugs and other things on the floor that can make you trip. What can I do with my stairs?  Do not leave any items on the stairs.  Make sure that there are handrails on both sides of the stairs and use them. Fix handrails that are broken or loose. Make sure that handrails are as long as the stairways.  Check any carpeting to make sure that it is firmly attached to the stairs. Fix any carpet that is loose or worn.  Avoid having throw rugs at the top or bottom of the stairs. If you do have throw rugs, attach them to the floor with carpet tape.  Make sure that you have a light switch at the top of the stairs and the bottom of the stairs. If you do not have them, ask someone to add them for you. What else can I do to help prevent falls?  Wear shoes that:  Do not have high heels.  Have rubber bottoms.  Are  comfortable and fit you well.  Are closed at the toe. Do not wear sandals.  If you use a stepladder:  Make sure that it is fully opened. Do not climb a closed stepladder.  Make sure that both sides of the stepladder are locked into place.  Ask someone to hold it for you, if possible.  Clearly mark and make sure that you can see:  Any grab bars or handrails.  First and last steps.  Where the edge of each step is.  Use tools that help you move around (mobility aids) if they are needed. These include:  Canes.  Walkers.  Scooters.  Crutches.  Turn on the lights when you go into a dark area. Replace any light bulbs as soon as they burn out.  Set up your furniture so you have a clear path. Avoid moving your furniture around.  If any of your floors are uneven, fix them.  If there are any pets around you, be aware of where they are.  Review your medicines with your doctor. Some medicines can make you feel dizzy. This can increase your chance of falling. Ask your doctor what other things that you can do to help prevent falls. This information is not intended to replace advice given to you by your health care provider. Make sure you discuss any questions you have with your health care provider. Document Released: 03/09/2009 Document Revised: 10/19/2015 Document Reviewed: 06/17/2014 Elsevier Interactive Patient Education  2017 [employer].

## 2016-10-24 NOTE — Progress Notes (Signed)
I have reviewed this encounter including the documentation in this note by Tyler Aas, LPN. I am certifying that I agree with the content of this note as this patient's Primary Care Physician.  Nobie Putnam, [facility] 10/24/2016, 8:09 AM

## 2016-11-07 ENCOUNTER — Encounter: Payer: Self-pay | Admitting: Family Medicine

## 2016-11-07 ENCOUNTER — Ambulatory Visit (INDEPENDENT_AMBULATORY_CARE_PROVIDER_SITE_OTHER): Payer: Medicare Other | Admitting: Family Medicine

## 2016-11-07 VITALS — BP 140/70 | HR 60 | Temp 98.0°F | Resp 16 | Ht 70.0 in | Wt 169.0 lb

## 2016-11-07 DIAGNOSIS — J3089 Other allergic rhinitis: Secondary | ICD-10-CM | POA: Diagnosis not present

## 2016-11-07 DIAGNOSIS — I129 Hypertensive chronic kidney disease with stage 1 through stage 4 chronic kidney disease, or unspecified chronic kidney disease: Secondary | ICD-10-CM

## 2016-11-07 DIAGNOSIS — J309 Allergic rhinitis, unspecified: Secondary | ICD-10-CM | POA: Insufficient documentation

## 2016-11-07 DIAGNOSIS — E1142 Type 2 diabetes mellitus with diabetic polyneuropathy: Secondary | ICD-10-CM | POA: Diagnosis not present

## 2016-11-07 DIAGNOSIS — N183 Chronic kidney disease, stage 3 (moderate): Secondary | ICD-10-CM | POA: Diagnosis not present

## 2016-11-07 DIAGNOSIS — Z0000 Encounter for general adult medical examination without abnormal findings: Secondary | ICD-10-CM | POA: Diagnosis not present

## 2016-11-07 DIAGNOSIS — G63 Polyneuropathy in diseases classified elsewhere: Secondary | ICD-10-CM | POA: Diagnosis not present

## 2016-11-07 MED ORDER — METFORMIN HCL ER 500 MG PO TB24: 500.0000 mg | ORAL_TABLET | Freq: Every day | ORAL | 3 refills | Status: DC

## 2016-11-07 MED ORDER — GABAPENTIN 600 MG PO TABS: 1200.0000 mg | ORAL_TABLET | Freq: Three times a day (TID) | ORAL | 2 refills | Status: DC

## 2016-11-07 MED ORDER — IPRATROPIUM BROMIDE 0.06 % NA SOLN: 2.0000 | Freq: Four times a day (QID) | NASAL | 2 refills | Status: DC

## 2016-11-07 NOTE — Assessment & Plan Note (Signed)
Possibly some chronic vasomotor rhinitis component Trial on Atrovent PRN use May hold Flonase for now, can resume this if needed. May resume Zyrtec, advised needs to use these meds longer intervals 4-6 weeks, not just PRN

## 2016-11-07 NOTE — Progress Notes (Addendum)
Subjective:    Patient ID: [name], male    DOB: [birth year], 83 y.o.   MRN: [id number]  [name] is a 83 y.o. male presenting on 11/07/2016 for Diabetes   HPI   CHRONIC DM, Type 2 with DM Neuropathy: Reports no concerns. Not due yet for A1c, has apt in >1 month. CBGs: avg 1 x weekly Meds: Metformin XR 500mg  daily - refill Reports good compliance. Tolerating well w/o side-effects Currently on ACEi Lifestyle: - Diet (no significant changes, does eat meal at retirement community now provided) - Exercise (some walking, plans to increase time at gym in his community) - Last Eye Exam 02/2016, previously [facility] Dr Sandra Cockayne, now s/p cataract had problems, has R eye cataract, he plans to f/u with Dr Ellin Mayhew - Last visit 08/2016 increased to Gabapentin 900mg  (1.5 of the 600mg  tabs) TID currently, had considered Lyrica in past. Still has not had any improvement with burning and numbness Denies hypoglycemia, polyuria, visual changes  Allergic Rhinitis vs Vasomotor Rhinitis: - Reports chronic intermittent nasal congestion and drainage. Usually not associated with other symptoms. Has been on Zyrtec OTC PRN, not taking regularly also tried Flonase before, had been prescribed to him in past. No longer using regularly.  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 Also has history of cervical spine disc problem, followed by chiropractor Dr Olen Cordial - He currently takes Ibuprofen 800mg  (x4 of the 200mg  tabs) x 1-2 doses every other day with some relief - Not taking regular Tylenol  CHRONIC HTN: Reports does not check BP outside office. Current Meds - Lisinopril 10mg    Reports good compliance, took meds today. Tolerating well, w/o complaints.  Health Maintenance: - UTD Vaccine status, pneumonia. TDap. - UTD Foot exam - UTD Ophthalmology DM Eye  exam, 02/2016   Past Medical History:  Diagnosis Date  . Diabetes mellitus without complication ([place])   . Hyperlipidemia   . Hypertension   . Neuropathy   . Prostate cancer [facility])    Past Surgical History:  Procedure Laterality Date  . CATARACT EXTRACTION W/PHACO Left 04/24/2015   Procedure: CATARACT EXTRACTION PHACO AND INTRAOCULAR LENS PLACEMENT (IOC);  Surgeon: Estill Cotta, MD;  Location: [facility] ORS;  Service: Ophthalmology;  Laterality: Left;  [place] 01:12 AP% 25.7 CDE 28.30 fluid pack lot #[id number]  . COLONOSCOPY     Social History   Social History  . Marital status: Widowed    Spouse name: N/A  . Number of children: N/A  . Years of education: N/A   Occupational History  . Not on file.   Social History Main Topics  . Smoking status: Former Smoker    Quit date: 05/27/1978  . Smokeless tobacco: Never Used  . Alcohol use No  . Drug use: No  . Sexual activity: Not on file   Other Topics Concern  . Not on file   Social History Narrative  . No narrative on file   Family History  Problem Relation Age of Onset  . Stroke Mother   . Cancer Mother        colon cancer  . COPD Father   . COPD Brother   . Hearing loss Son    Current Outpatient Prescriptions on File Prior to Visit  Medication Sig  . DUREZOL 0.05 % EMUL Place 2.95 application into the left eye as needed.  Marland Kitchen glucosamine-chondroitin 500-400  MG tablet Take 1 tablet by mouth 3 (three) times daily as needed.   Marland Kitchen lisinopril (PRINIVIL,ZESTRIL) 10 MG tablet 1 (one) Tablet, Oral, daily  . VOLTAREN 1 % GEL 4 GRAMS, Transdermal, four times daily, as needed  . ZETIA 10 MG tablet Take 1 tablet (10 mg total) by mouth at bedtime.   No current facility-administered medications on file prior to visit.     Review of Systems  Constitutional: Negative for activity change, appetite change, chills, diaphoresis, fatigue, fever and unexpected weight change.  HENT: Positive for congestion and postnasal drip. Negative for  hearing loss and sinus pain.   Eyes: Negative for discharge and visual disturbance.       R eye cataract  Respiratory: Negative for apnea, cough, chest tightness, shortness of breath and wheezing.   Cardiovascular: Negative for chest pain, palpitations and leg swelling.  Gastrointestinal: Negative for abdominal pain, anal bleeding, blood in stool, constipation, diarrhea, nausea and vomiting.  Endocrine: Negative for cold intolerance and polyuria.  Genitourinary: Negative for decreased urine volume, difficulty urinating, dysuria, frequency and hematuria.  Musculoskeletal: Positive for arthralgias (L shoulder). Negative for back pain, myalgias and neck pain.  Skin: Negative for rash.  Allergic/Immunologic: Positive for environmental allergies.  Neurological: Negative for dizziness, weakness, light-headedness, numbness and headaches.  Hematological: Negative for adenopathy.  Psychiatric/Behavioral: Negative for behavioral problems, dysphoric mood and sleep disturbance. The patient is not nervous/anxious.    Per HPI unless specifically indicated above     Objective:    BP 140/70   Pulse 60   Temp 98 F (36.7 C) (Oral)   Resp 16   Ht 5\' 10"  (1.778 m)   Wt 169 lb (76.7 kg)   BMI 24.25 kg/m   Wt Readings from Last 3 Encounters:  11/07/16 169 lb (76.7 kg)  10/22/16 168 lb 9.6 oz (76.5 kg)  09/16/16 166 lb (75.3 kg)    Physical Exam  Constitutional: He is oriented to person, place, and time. He appears well-developed and well-nourished. No distress.  Well-appearing, comfortable, cooperative  HENT:  Head: Normocephalic and atraumatic.  Mouth/Throat: Oropharynx is clear and moist.  Frontal / maxillary sinuses non-tender. Nares mostly patent with mild deeper turbinate edema with some congestion without purulence. Oropharynx clear without erythema, exudates, edema or asymmetry.  Eyes: Conjunctivae and EOM are normal. Pupils are equal, round, and reactive to light.  Neck: Normal range of  motion. Neck supple. No thyromegaly present.  No carotid bruits  Cardiovascular: Normal rate, regular rhythm, normal heart sounds and intact distal pulses.   No murmur heard. Pulmonary/Chest: Effort normal and breath sounds normal. No respiratory distress. He has no wheezes. He has no rales.  Abdominal: Soft. Bowel sounds are normal. He exhibits no distension and no mass. There is no tenderness.  Musculoskeletal: He exhibits no edema.  Left Shoulder Inspection: Normal appearance bilateral symmetrical Palpation: Mild localized tender to palpation over anterior shoulder. Not posterior shoulder ROM: Mostly full intact active ROM forward flexion (slight difficulty obtaining full flex due to some stiffness but not limited by pain), abduction, internal / external rotation, symmetrical Special Testing: Rotator cuff testing negative for weakness with supraspinatus full can and empty can test, O'brien's negative for labral pain, Hawkin's AC impingement very mildly positive for pain Strength: Normal strength 5/5 flex/ext, ext rot / int rot, grip, rotator cuff str testing. Neurovascular: Distally intact pulses, sensation to light touch  Lymphadenopathy:    He has no cervical adenopathy.  Neurological: He is alert and oriented to  person, place, and time.  Skin: Skin is warm and dry. No rash noted. He is not diaphoretic.  Psychiatric: He has a normal mood and affect. His behavior is normal.  Well groomed, good eye contact, normal speech and thoughts  Nursing note and vitals reviewed.    Results for orders placed or performed in visit on 09/16/16  POCT HgB A1C  Result Value Ref Range   Hemoglobin A1C 6.6     Recent Labs  02/01/16 1112 05/13/16 0853 09/16/16 0920  HGBA1C 7.1% 6.6% 6.6       Assessment & Plan:   Problem List Items Addressed This Visit    Type 2 diabetes mellitus with diabetic polyneuropathy ([place])    Stable. Not due for A1c No changes to DM management Increase Gabapentin,  now max dose 1200 TID, if not improved by next visit consider Lyrica      Relevant Medications   metFORMIN (GLUCOPHAGE-XR) 500 MG 24 hr tablet   gabapentin (NEURONTIN) 600 MG tablet   Polyneuropathy associated with underlying disease ([place])    Clinically consistent with DM neuropathy. Limited improvement on high dose gabapentin.  Plan: 1. Increase Gabapentin once more from 900 TID to 1200 - 600 - 1200, then if still not improved by 2 weeks, can take max dose 1200 TID, sent new rx. After 6 weeks by next DM apt, if not improved discuss option of trial on Lyrica      Relevant Medications   gabapentin (NEURONTIN) 600 MG tablet   Benign hypertension with CKD (chronic kidney disease) stage III    Mild elevated BP, but near goal < 140/90 for DM patient, also age 62 goal < 970/26 Complication: CKD-III, stable  Plan: 1. Continue ACEi Lisinopril 10mg  daily for HTN/CKD/DM 2. Improve hydration, lifestyle regular exercise 3. Monitor BP outside office if he can occasionally 4. Follow-up as scheduled      Allergic rhinitis    Possibly some chronic vasomotor rhinitis component Trial on Atrovent PRN use May hold Flonase for now, can resume this if needed. May resume Zyrtec, advised needs to use these meds longer intervals 4-6 weeks, not just PRN      Relevant Medications   ipratropium (ATROVENT) 0.06 % nasal spray    Other Visit Diagnoses    Annual physical exam    -  Primary      Meds ordered this encounter  Medications  . metFORMIN (GLUCOPHAGE-XR) 500 MG 24 hr tablet    Sig: Take 1 tablet (500 mg total) by mouth daily with breakfast.    Dispense:  90 tablet    Refill:  3  . gabapentin (NEURONTIN) 600 MG tablet    Sig: Take 2 tablets (1,200 mg total) by mouth 3 (three) times daily.    Dispense:  180 tablet    Refill:  2  . ipratropium (ATROVENT) 0.06 % nasal spray    Sig: Place 2 sprays into both nostrils 4 (four) times daily. For up to 5-7 days then stop. Or may use as needed     Dispense:  15 mL    Refill:  2    Follow up plan: Return for diabetes.  Nobie Putnam, [facility] 11/07/2016, 4:12 PM

## 2016-11-07 NOTE — Assessment & Plan Note (Signed)
Clinically consistent with DM neuropathy. Limited improvement on high dose gabapentin.  Plan: 1. Increase Gabapentin once more from 900 TID to 1200 - 600 - 1200, then if still not improved by 2 weeks, can take max dose 1200 TID, sent new rx. After 6 weeks by next DM apt, if not improved discuss option of trial on Lyrica

## 2016-11-07 NOTE — Assessment & Plan Note (Signed)
Stable. Not due for A1c No changes to DM management Increase Gabapentin, now max dose 1200 TID, if not improved by next visit consider Lyrica

## 2016-11-07 NOTE — Patient Instructions (Addendum)
Thank you for coming to the clinic today.  1.  Increase Gabapentin once more - last chance - Take Gabapentin 600mg  tablets - TWO in morning = 1200... ONE in afternoon = 600mg ..... TWO in evening = 1200 - Total dose = 3000mg   Try this for 2 weeks. If still not improved. You may go to MAX DOSE - 2 pills = 1200mg  - THREE TIMES A DAY = 3600  ---------------- If still not helping, will switch to Lyrica at your next visit in July.  2.  For shoulder pain  Recommend to start taking Tylenol Extra Strength 500mg  tabs - take 1 to 2 tabs per dose (max 1000mg ) every 6-8 hours for pain (take regularly, don't skip a dose for next 7 days), max 24 hour daily dose is 6 tablets or 3000mg . In the future you can repeat the same everyday Tylenol course for 1-2 weeks at a time.   Take less Ibuprofen in future - if going to take prefer 2-3 tablets of the 200mg  - dose of 400-600mg  max, up to max of 3 times a day with food. Only for 1 week or less. Try to only use this about 1-2 times a month.  Use moist heating pad as well.  In future we can consider Baclofen muscle relaxant if needed as. Safer option   ----- For nasal congestion  Start Atrovent nasal spray decongestant 2 sprays in each nostril up to 4 times daily for 7 days - may use as needed for congestion as well.  Can stop Flonase for now May continue Cetirizine or Zyrtec once daily for several weeks  Please schedule a Follow-up Appointment to: Return for diabetes.  If you have any other questions or concerns, please feel free to call the clinic or send a message through [place]. You may also schedule an earlier appointment if necessary.  Nobie Putnam, DO [place]

## 2016-11-07 NOTE — Assessment & Plan Note (Signed)
Mild elevated BP, but near goal < 140/90 for DM patient, also age 83 goal < 601/56 Complication: CKD-III, stable  Plan: 1. Continue ACEi Lisinopril 10mg  daily for HTN/CKD/DM 2. Improve hydration, lifestyle regular exercise 3. Monitor BP outside office if he can occasionally 4. Follow-up as scheduled

## 2016-12-13 ENCOUNTER — Emergency Department
Admission: EM | Admit: 2016-12-13 | Discharge: 2016-12-13 | Disposition: A | Discharge status: Home / Self Care | Payer: Medicare Other | Attending: Emergency Medicine | Admitting: Emergency Medicine

## 2016-12-13 ENCOUNTER — Encounter: Payer: Self-pay | Admitting: Emergency Medicine

## 2016-12-13 ENCOUNTER — Emergency Department: Payer: Medicare Other

## 2016-12-13 DIAGNOSIS — S0101XA Laceration without foreign body of scalp, initial encounter: Secondary | ICD-10-CM | POA: Diagnosis not present

## 2016-12-13 DIAGNOSIS — S0990XA Unspecified injury of head, initial encounter: Secondary | ICD-10-CM | POA: Diagnosis not present

## 2016-12-13 DIAGNOSIS — Y999 Unspecified external cause status: Secondary | ICD-10-CM | POA: Insufficient documentation

## 2016-12-13 DIAGNOSIS — Z79899 Other long term (current) drug therapy: Secondary | ICD-10-CM | POA: Diagnosis not present

## 2016-12-13 DIAGNOSIS — E119 Type 2 diabetes mellitus without complications: Secondary | ICD-10-CM | POA: Diagnosis not present

## 2016-12-13 DIAGNOSIS — Y9389 Activity, other specified: Secondary | ICD-10-CM | POA: Insufficient documentation

## 2016-12-13 DIAGNOSIS — Z7984 Long term (current) use of oral hypoglycemic drugs: Secondary | ICD-10-CM | POA: Diagnosis not present

## 2016-12-13 DIAGNOSIS — Z87891 Personal history of nicotine dependence: Secondary | ICD-10-CM | POA: Diagnosis not present

## 2016-12-13 DIAGNOSIS — R55 Syncope and collapse: Secondary | ICD-10-CM

## 2016-12-13 DIAGNOSIS — Y9289 Other specified places as the place of occurrence of the external cause: Secondary | ICD-10-CM | POA: Insufficient documentation

## 2016-12-13 DIAGNOSIS — IMO0002 Reserved for concepts with insufficient information to code with codable children: Secondary | ICD-10-CM

## 2016-12-13 DIAGNOSIS — I10 Essential (primary) hypertension: Secondary | ICD-10-CM | POA: Diagnosis not present

## 2016-12-13 DIAGNOSIS — W19XXXA Unspecified fall, initial encounter: Secondary | ICD-10-CM | POA: Insufficient documentation

## 2016-12-13 LAB — BASIC METABOLIC PANEL
ANION GAP: 9 (ref 5–15)
BUN: 28 mg/dL — ABNORMAL HIGH (ref 6–20)
CALCIUM: 9.4 mg/dL (ref 8.9–10.3)
CO2: 21 mmol/L — AB (ref 22–32)
Chloride: 105 mmol/L (ref 101–111)
Creatinine, Ser: 1.55 mg/dL — ABNORMAL HIGH (ref 0.61–1.24)
GFR calc non Af Amer: 41 mL/min — ABNORMAL LOW (ref >60)
GFR, EST AFRICAN AMERICAN: 47 mL/min — AB (ref >60)
Glucose, Bld: 153 mg/dL — ABNORMAL HIGH (ref 65–99)
Potassium: 4.1 mmol/L (ref 3.5–5.1)
SODIUM: 135 mmol/L (ref 135–145)

## 2016-12-13 LAB — CBC
HCT: 47.2 % (ref 40.0–52.0)
HEMOGLOBIN: 16.5 g/dL (ref 13.0–18.0)
MCH: 30 pg (ref 26.0–34.0)
MCHC: 35 g/dL (ref 32.0–36.0)
MCV: 85.6 fL (ref 80.0–100.0)
Platelets: 159 10*3/uL (ref 150–440)
RBC: 5.51 MIL/uL (ref 4.40–5.90)
RDW: 13.5 % (ref 11.5–14.5)
WBC: 11 10*3/uL — AB (ref 3.8–10.6)

## 2016-12-13 LAB — TROPONIN I

## 2016-12-13 MED ORDER — SODIUM CHLORIDE 0.9 % IV BOLUS (SEPSIS)
1000.0000 mL | Freq: Once | INTRAVENOUS | Status: AC
  Administered 2016-12-13: 1000 mL via INTRAVENOUS

## 2016-12-13 MED ORDER — BACITRACIN ZINC 500 UNIT/GM EX OINT
TOPICAL_OINTMENT | Freq: Once | CUTANEOUS | Status: AC
  Administered 2016-12-13: 1 via TOPICAL
  Filled 2016-12-13: qty 0.9

## 2016-12-13 NOTE — ED Provider Notes (Signed)
[facility] Emergency Department Provider Note  ____________________________________________   First MD Initiated Contact with Patient 12/13/16 1710     (approximate)  I have reviewed the triage vital signs and the nursing notes.   HISTORY  Chief Complaint Loss of Consciousness   HPI [name] is a 83 y.o. male with a history of diabetes as well as hypertension who is presenting to the emergency department after 2 syncopal episodes. He was out in the heat for several hours, not drinking much water when he began to feel lightheaded as well as diaphoretic. He denies any chest pain or palpitations. Says that he was able to sit down but then had to syncopal episodes witnessed by his son. EMS was called and the patient was found to have an initial blood pressure in the 70s which improved with fluids. Patient also hit his head when he syncopized causing a very minor laceration. Patient says that he has had a tetanus shot within the past 5 years and does not take any blood thinners. Patient continues to be asymptomatic at this time without any chest pain, lightheadedness or shortness of breath.   Past Medical History:  Diagnosis Date  . Diabetes mellitus without complication ([place])   . Hyperlipidemia   . Hypertension   . Neuropathy   . Prostate cancer [facility])     Patient Active Problem List   Diagnosis Date Noted  . Polyneuropathy associated with underlying disease ([place]) 11/07/2016  . Allergic rhinitis 11/07/2016  . Peripheral neuropathy 05/13/2016  . Benign hypertension with CKD (chronic kidney disease) stage III 01/19/2015  . Type 2 diabetes mellitus with diabetic polyneuropathy ([place]) 01/19/2015  . Hyperlipidemia associated with type 2 diabetes mellitus ([place]) 01/19/2015  . Personal history of prostate cancer 01/19/2015  . Nocturia 01/19/2015    Past Surgical History:  Procedure Laterality Date  . CATARACT EXTRACTION W/PHACO Left 04/24/2015   Procedure: CATARACT EXTRACTION PHACO AND INTRAOCULAR LENS PLACEMENT (IOC);  Surgeon: Estill Cotta, MD;  Location: [facility] ORS;  Service: Ophthalmology;  Laterality: Left;  [place] 01:12 AP% 25.7 CDE 28.30 fluid pack lot #[id number]  . COLONOSCOPY      Prior to Admission medications   Medication Sig Start Date End Date Taking? Authorizing Provider  DUREZOL 0.05 % EMUL Place 6.44 application into the left eye as needed. 04/11/15   [provider]  gabapentin (NEURONTIN) 600 MG tablet Take 2 tablets (1,200 mg total) by mouth 3 (three) times daily. 11/07/16   Karamalegos, Devonne Doughty, DO  glucosamine-chondroitin 500-400 MG tablet Take 1 tablet by mouth 3 (three) times daily as needed.     [provider]  ipratropium (ATROVENT) 0.06 % nasal spray Place 2 sprays into both nostrils 4 (four) times daily. For up to 5-7 days then stop. Or may use as needed 11/07/16   Olin Hauser, DO  lisinopril (PRINIVIL,ZESTRIL) 10 MG tablet 1 (one) Tablet, Oral, daily 01/30/16   Arlis Porta., MD  metFORMIN (GLUCOPHAGE-XR) 500 MG 24 hr tablet Take 1 tablet (500 mg total) by mouth daily with breakfast. 11/07/16   Parks Ranger, Devonne Doughty, DO  VOLTAREN 1 % GEL 4 GRAMS, Transdermal, four times daily, as needed 06/25/16   Arlis Porta., MD  ZETIA 10 MG tablet Take 1 tablet (10 mg total) by mouth at bedtime. 09/16/16   Olin Hauser, DO    Allergies Patient has no known allergies.  Family History  Problem Relation Age of Onset  . Stroke Mother   .  Cancer Mother        colon cancer  . COPD Father   . COPD Brother   . Hearing loss Son     Social History Social History  Substance Use Topics  . Smoking status: Former Smoker    Quit date: 05/27/1978  . Smokeless tobacco: Never Used  . Alcohol use No    Review of Systems  Constitutional: No fever/chills Eyes: No visual changes. ENT: No sore throat. Cardiovascular: Denies chest pain. Respiratory: Denies shortness  of breath. Gastrointestinal: No abdominal pain.  No nausea, no vomiting.  No diarrhea.  No constipation. Genitourinary: Negative for dysuria. Musculoskeletal: Negative for back pain. Skin: Negative for rash. Neurological: Negative for headaches, focal weakness or numbness.   ____________________________________________   PHYSICAL EXAM:  VITAL SIGNS: ED Triage Vitals  Enc Vitals Group     BP 12/13/16 1721 (!) 159/101     Pulse Rate 12/13/16 1721 83     Resp 12/13/16 1721 16     Temp 12/13/16 1721 97.6 F (36.4 C)     Temp Source 12/13/16 1721 Oral     SpO2 12/13/16 1721 95 %     Weight --      Height --      Head Circumference --      Peak Flow --      Pain Score 12/13/16 1703 8     Pain Loc --      Pain Edu? --      Excl. in [place]? --     Constitutional: Alert and oriented. Well appearing and in no acute distress. Eyes: Conjunctivae are normal.  Head: 0.5 cm superficial laceration to the left occiput without any active bleeding at this time. No overlying hematoma. No depression or bogginess. Very minimal tenderness to palpation. Nose: No congestion/rhinnorhea. Mouth/Throat: Mucous membranes are moist.  Neck: No stridor.  No tenderness to palpation of the cervical spine. No deformity or step-off. Cardiovascular: Normal rate, regular rhythm. Grossly normal heart sounds.   Respiratory: Normal respiratory effort.  No retractions. Lungs CTAB. Gastrointestinal: Soft and nontender. No distention.  Musculoskeletal: No lower extremity tenderness nor edema.  No joint effusions. Neurologic:  Normal speech and language. No gross focal neurologic deficits are appreciated. Skin:  Skin is warm, dry and intact. No rash noted. Psychiatric: Mood and affect are normal. Speech and behavior are normal.  ____________________________________________   LABS (all labs ordered are listed, but only abnormal results are displayed)  Labs Reviewed  BASIC METABOLIC PANEL - Abnormal; Notable for  the following:       Result Value   CO2 21 (*)    Glucose, Bld 153 (*)    BUN 28 (*)    Creatinine, Ser 1.55 (*)    GFR calc non Af Amer 41 (*)    GFR calc Af Amer 47 (*)    All other components within normal limits  CBC - Abnormal; Notable for the following:    WBC 11.0 (*)    All other components within normal limits  TROPONIN I  URINALYSIS, COMPLETE (UACMP) WITH MICROSCOPIC  CBG MONITORING, ED   ____________________________________________  EKG  ED ECG REPORT I, Doran Stabler, the attending physician, personally viewed and interpreted this ECG.   Date: 12/13/2016  EKG Time: 1713  Rate: 83  Rhythm: normal sinus rhythm  Axis: normal  Intervals:none  ST&T Change: No ST segment elevation or depression. No abnormal T-wave inversion.  ____________________________________________  RADIOLOGY  No acute finding on the  CT of the brain.   ____________________________________________   PROCEDURES  Procedure(s) performed:   Procedures  Critical Care performed:   ____________________________________________   INITIAL IMPRESSION / ASSESSMENT AND PLAN / ED COURSE  Pertinent labs & imaging results that were available during my care of the patient were reviewed by me and considered in my medical decision making (see chart for details).   ----------------------------------------- 8:15 PM on 12/13/2016 -----------------------------------------  Patient is symptomatic at this time except for "stinging" over the area of laceration. He has also developed hematoma to the occiput since arriving to the hospital but did not have any evidence of internal injury on his CT of the brain. Reassuring lab work with possible minor dehydration. He was given IV fluids and his pressure has normalized. He continues to deny any chest pain or shortness of breath. Likely dehydration secondary to the patient working hard outside in the heat today without much fluid. He is understanding of  the diagnosis as well as treatment plan follow-up with his primary care doctor. Will be discharged home.      ____________________________________________   FINAL CLINICAL IMPRESSION(S) / ED DIAGNOSES  Syncope. Cephalohematoma. Laceration.    NEW MEDICATIONS STARTED DURING THIS VISIT:  New Prescriptions   No medications on file     Note:  This document was prepared using Dragon voice recognition software and may include unintentional dictation errors.     Orbie Pyo, MD 12/13/16 2016

## 2016-12-13 NOTE — ED Triage Notes (Signed)
Pt to ED via EMS from home, was working outside in heat and has 2 syncopal episodes. The last syncopal episode pt hit back of his head on a wood splinter. Pt denies any blood thinner use.Per EMS initial BP was 74/48, pt received 300 mL of fluid and BP currently 116/72. Pt A&Ox4, diaphoresis noted

## 2016-12-13 NOTE — ED Notes (Signed)
18G Left Hand IV removed.  Site is clean, dry, and intact.

## 2016-12-19 ENCOUNTER — Other Ambulatory Visit: Payer: Self-pay

## 2016-12-20 ENCOUNTER — Ambulatory Visit (INDEPENDENT_AMBULATORY_CARE_PROVIDER_SITE_OTHER): Payer: Medicare Other | Admitting: Family Medicine

## 2016-12-20 ENCOUNTER — Encounter: Payer: Self-pay | Admitting: Family Medicine

## 2016-12-20 VITALS — BP 138/74 | HR 61 | Temp 98.0°F | Resp 16 | Ht 70.0 in | Wt 168.4 lb

## 2016-12-20 DIAGNOSIS — I129 Hypertensive chronic kidney disease with stage 1 through stage 4 chronic kidney disease, or unspecified chronic kidney disease: Secondary | ICD-10-CM | POA: Diagnosis not present

## 2016-12-20 DIAGNOSIS — S0101XA Laceration without foreign body of scalp, initial encounter: Secondary | ICD-10-CM | POA: Diagnosis not present

## 2016-12-20 DIAGNOSIS — E1142 Type 2 diabetes mellitus with diabetic polyneuropathy: Secondary | ICD-10-CM

## 2016-12-20 DIAGNOSIS — N189 Chronic kidney disease, unspecified: Secondary | ICD-10-CM

## 2016-12-20 DIAGNOSIS — N183 Chronic kidney disease, stage 3 (moderate): Secondary | ICD-10-CM | POA: Diagnosis not present

## 2016-12-20 DIAGNOSIS — N179 Acute kidney failure, unspecified: Secondary | ICD-10-CM | POA: Diagnosis not present

## 2016-12-20 DIAGNOSIS — T671XXA Heat syncope, initial encounter: Secondary | ICD-10-CM | POA: Diagnosis not present

## 2016-12-20 LAB — POCT GLYCOSYLATED HEMOGLOBIN (HGB A1C): Hemoglobin A1C: 7.1 — AB (ref ?–5.7)

## 2016-12-20 NOTE — Patient Instructions (Addendum)
Thank you for coming to the clinic today.  LAB TODAY to check kidney  HOLD Lisinopril 10mg  - do not take this until you hear back from our office.  Drink extra water as discussed.  Call us with the name of the medication you are missing, and we can try to refill it.  Please schedule a Follow-up Appointment to: Return in about 3 months (around 03/22/2017) for diabetes, blood pressure.  If you have any other questions or concerns, please feel free to call the clinic or send a message through [place]. You may also schedule an earlier appointment if necessary.  Additionally, you may be receiving a survey about your experience at our clinic within a few days to 1 week by e-mail or mail. We value your feedback.  Nobie Putnam, DO [place]

## 2016-12-20 NOTE — Assessment & Plan Note (Addendum)
Well-controlled DM with A1c 7.1 (slightly elevated from prior 6.6) Complications - CKD-III, peripheral neuropathy  Plan:  1. Continue current therapy - Metformin XR 500mg  2. Encourage improved lifestyle - low carb, low sugar diet, reduce portion size, continue improving regular exercise 3. Continue ACEi (temporarily hold due to AKI) 4. Continue Gabapentin 1200 TID > future consider Lyrica 5. Advised to schedule DM ophtho exam, send record 6. Follow-up 3 months A1c

## 2016-12-20 NOTE — Progress Notes (Signed)
Subjective:    Patient ID: [name], male    DOB: [birth year], 83 y.o.   MRN: [id number]  [name] is a 83 y.o. male presenting on 12/20/2016 for Diabetes (obtw dehydration hospital f/u)   HPI   ED FOLLOW-UP VISIT  Hospital/Location: [facility] Date of ED Visit: 12/14/15  Reason for Presenting to ED: Syncope / Dehydration / Head injury Primary (+Secondary) Diagnosis: same, with AoCKD  FOLLOW-UP - ED provider note and record have been reviewed - Patient presents today about 7 days after recent ED visit. Brief summary of recent course, patient had symptoms of dehydration and exhaustion after working hard outside splitting wood and didn't drink water, he was sweating, and he felt "woozy" and sat down to rest and passed out, fell down and hit back of head on "log splitter" and got a laceration, ambulance called and he went to ED, found to have hypotension with SBP 70s, had a head CT unremarkable without acute injury, treated with IV fluid  for few hours, with dx dehydration and his symptoms had resolved. - Today reports overall has done well after discharge from ED. Symptoms of heat exhaustion and dehydration and syncope have resolved. He has not had any new symptoms. No headaches, no bleeding from laceration, tried some antibiotic ointment, then no longer using it, states he has "no one to apply it". - He drinks more water since hospital, but past 1-2 days has not adhered to this as well. He drinks less tea. Still some coffee - New medications on discharge: none - Changes to current meds on discharge: none  CHRONIC DM, Type 2 with DM Neuropathy: Reports no concerns. CBGs: avg 1 x weekly Meds: Metformin XR 500mg  daily Reports good compliance. Tolerating well w/o side-effects Currently on ACEi Lifestyle: - Diet (no significant changes, does eat meal at [facility] community) - Exercise (some walking, admits need to exercise more) - Last Eye Exam 02/2016, previously  [facility] Dr Sandra Cockayne, he plans to f/u with Dr Ellin Mayhew - Last visit 10/2016 increased to Gabapentin 1200mg  TID, some mild improvement but not worse but still same neuropathy, had considered Lyrica in past. Still has not had any improvement with burning and numbness Denies hypoglycemia, polyuria, visual changes  CHRONIC HTN with Nephropathy CKD-III Reports does not check BP outside office. Current Meds - Lisinopril 10mg  Reports good compliance, took meds today. Tolerating well, w/o complaints. Denies CP, dyspnea, HA, edema   Social History  Substance Use Topics  . Smoking status: Former Smoker    Quit date: 05/27/1978  . Smokeless tobacco: Never Used  . Alcohol use No    Review of Systems Per HPI unless specifically indicated above     Objective:    BP 138/74 (BP Location: Left Arm, Cuff Size: Normal)   Pulse 61   Temp 98 F (36.7 C) (Oral)   Resp 16   Ht 5\' 10"  (1.778 m)   Wt 168 lb 6.4 oz (76.4 kg)   BMI 24.16 kg/m   Wt Readings from Last 3 Encounters:  12/20/16 168 lb 6.4 oz (76.4 kg)  11/07/16 169 lb (76.7 kg)  10/22/16 168 lb 9.6 oz (76.5 kg)    Physical Exam  Constitutional: He is oriented to person, place, and time. He appears well-developed and well-nourished. No distress.  Well-appearing, comfortable, cooperative  HENT:  Head: Normocephalic.  Mouth/Throat: Oropharynx is clear and moist.  No facial or scalp ecchymosis or deformity  Eyes: Conjunctivae are normal. Right eye exhibits no  discharge. Left eye exhibits no discharge.  Neck: Normal range of motion. Neck supple. No thyromegaly present.  Cardiovascular: Normal rate, regular rhythm, normal heart sounds and intact distal pulses.   No murmur heard. Pulmonary/Chest: Effort normal and breath sounds normal. No respiratory distress. He has no wheezes. He has no rales.  Musculoskeletal: Normal range of motion. He exhibits no edema.  Lymphadenopathy:    He has no cervical adenopathy.  Neurological: He is  alert and oriented to person, place, and time.  Skin: Skin is warm and dry. No rash noted. He is not diaphoretic. No erythema.  Scalp - posterior mid to left side with 2-3 cm area of superficial abrasion with healing tissue, mild tender, no drainage, on extending erythema  Psychiatric: He has a normal mood and affect. His behavior is normal.  Well groomed, good eye contact, normal speech and thoughts  Nursing note and vitals reviewed.    Posterior scalp     Results for orders placed or performed in visit on 12/20/16  POCT HgB A1C  Result Value Ref Range   Hemoglobin A1C 7.1 (A) 5.7    Recent Labs  05/13/16 0853 09/16/16 0920 12/20/16 2200  HGBA1C 6.6% 6.6 7.1*       Assessment & Plan:   Problem List Items Addressed This Visit    Type 2 diabetes mellitus with diabetic polyneuropathy ([place]) - Primary    Well-controlled DM with A1c 7.1 (slightly elevated from prior 6.6) Complications - CKD-III, peripheral neuropathy  Plan:  1. Continue current therapy - Metformin XR 500mg  2. Encourage improved lifestyle - low carb, low sugar diet, reduce portion size, continue improving regular exercise 3. Continue ACEi (temporarily hold due to AKI) 4. Continue Gabapentin 1200 TID > future consider Lyrica 5. Advised to schedule DM ophtho exam, send record 6. Follow-up 3 months A1c      Relevant Orders   POCT HgB A1C (Completed)   Benign hypertension with CKD (chronic kidney disease) stage III    Mild elevated BP, improved on recheck No home readings Complication: CKD-III - now with AKI  Plan: 1. HOLD Lisinopril 10mg  daily for now - check BMET today Cr trend - follow-up result and notify when to restart ACEi 2. Improve hydration, avoid NSAIDs, lifestyle regular exercise 3. Monitor BP outside office if he can occasionally 4. Follow-up 3 mo       Other Visit Diagnoses    Heat syncope, initial encounter      Likely secondary to overexertion in heat, poor hydration Reviewed ED  record Reassurance given no other significant abnormality Re-check BMET today    Acute kidney injury superimposed on chronic kidney disease ([place])     Consistent with AoCKD with Cr inc from 1.2 to 1.55, presumed secondary to dehydration and heat exhaustion/syncope  1. HOLD Lisinopril 10mg  daily for now - check BMET today Cr trend - follow-up result and notify when to restart ACEi 2. Improve hydration, continue to avoid NSAIDs    Relevant Orders   BASIC METABOLIC PANEL WITH GFR   Laceration of scalp, initial encounter      Minor, well healing, no obvious complications Encourage routine hygiene, BID topical OTC antibiotic ointment Return criteria reviewed      No orders of the defined types were placed in this encounter.   Follow up plan: Return in about 3 months (around 03/22/2017) for diabetes, blood pressure.  Nobie Putnam, [facility] 12/20/2016, 10:11 PM

## 2016-12-20 NOTE — Addendum Note (Signed)
Addended by: Olin Hauser on: 12/20/2016 04:50 PM   Modules accepted: Level of Service

## 2016-12-20 NOTE — Assessment & Plan Note (Signed)
Mild elevated BP, improved on recheck No home readings Complication: CKD-III - now with AKI  Plan: 1. HOLD Lisinopril 10mg  daily for now - check BMET today Cr trend - follow-up result and notify when to restart ACEi 2. Improve hydration, avoid NSAIDs, lifestyle regular exercise 3. Monitor BP outside office if he can occasionally 4. Follow-up 3 mo

## 2016-12-21 LAB — BASIC METABOLIC PANEL WITH GFR
BUN: 23 mg/dL (ref 7–25)
CALCIUM: 9.7 mg/dL (ref 8.6–10.3)
CO2: 24 mmol/L (ref 20–31)
CREATININE: 1.24 mg/dL — AB (ref 0.70–1.11)
Chloride: 97 mmol/L — ABNORMAL LOW (ref 98–110)
GFR, EST AFRICAN AMERICAN: 63 mL/min (ref >=60)
GFR, Est Non African American: 55 mL/min — ABNORMAL LOW (ref >=60)
GLUCOSE: 171 mg/dL — AB (ref 65–99)
POTASSIUM: 4.6 mmol/L (ref 3.5–5.3)
Sodium: 136 mmol/L (ref 135–146)

## 2016-12-23 ENCOUNTER — Other Ambulatory Visit: Payer: Self-pay | Admitting: Family Medicine

## 2016-12-23 DIAGNOSIS — N183 Chronic kidney disease, stage 3 unspecified: Secondary | ICD-10-CM

## 2016-12-23 DIAGNOSIS — I129 Hypertensive chronic kidney disease with stage 1 through stage 4 chronic kidney disease, or unspecified chronic kidney disease: Secondary | ICD-10-CM

## 2016-12-23 MED ORDER — LISINOPRIL 10 MG PO TABS: ORAL_TABLET | ORAL | 3 refills | Status: DC

## 2017-02-10 ENCOUNTER — Encounter: Payer: Self-pay | Admitting: Family Medicine

## 2017-02-10 ENCOUNTER — Ambulatory Visit (INDEPENDENT_AMBULATORY_CARE_PROVIDER_SITE_OTHER): Payer: Medicare Other | Admitting: Family Medicine

## 2017-02-10 VITALS — BP 131/86 | HR 53 | Temp 97.8°F | Resp 16 | Ht 70.0 in | Wt 171.0 lb

## 2017-02-10 DIAGNOSIS — S0101XD Laceration without foreign body of scalp, subsequent encounter: Secondary | ICD-10-CM

## 2017-02-10 MED ORDER — MUPIROCIN 2 % EX OINT: 1.0000 "application " | TOPICAL_OINTMENT | Freq: Two times a day (BID) | CUTANEOUS | 0 refills | Status: DC

## 2017-02-10 NOTE — Patient Instructions (Addendum)
Thank you for coming to the clinic today.  1. Referral to [place] at [facility] [address] [place], [place] [address]  Main: [phone]   Go ahead and start topical antibiotic Mupirocin twice daily for 7-10 days to prevent infection  Please schedule a Follow-up Appointment to: Return in about 3 weeks (around 03/03/2017), or if symptoms worsen or fail to improve, for Scalp laceration, not healing.  If you have any other questions or concerns, please feel free to call the clinic or send a message through [place]. You may also schedule an earlier appointment if necessary.  Additionally, you may be receiving a survey about your experience at our clinic within a few days to 1 week by e-mail or mail. We value your feedback.  Nobie Putnam, DO [place]

## 2017-02-10 NOTE — Progress Notes (Signed)
Subjective:    Patient ID: [name], male    DOB: Mar 01, 1934, 83 y.o.   MRN: [id number]  [name] is a 83 y.o. male presenting on 02/10/2017 for Wound Check  Patient presents for a same day appointment.  HPI   FOLLOW-UP SCALP LACERATION - Last visit with me 12/20/16, for initial visit for same problem following syncopal episode and laceration 7/20 from fall, treated with topical antibiotic ointment OTC and routine care, see prior notes for background information. - Interval update with persistent scalp wound. Limited interval healing, has had recurrent scabbing, and recently scab came off and had a little bleeding. - Today patient reports concern scalp laceration is not healed yet. He saw his chiropractor recently, and advised him to get it checked out again, there was a concern of some redness near the wound. He admits to some slight soreness still, but no worsening pain or new symptoms - He tried neosporin regularly at first, then not using as much since then, after it scabbed up - He is diabetic and concern about wound healing - Denies fever,chills, sweats, drainage of pus, excessive bleeding, new injury or fall or trauma  Social History  Substance Use Topics  . Smoking status: Former Smoker    Quit date: 05/27/1978  . Smokeless tobacco: Never Used  . Alcohol use No    Review of Systems Per HPI unless specifically indicated above     Objective:    BP 131/86   Pulse (!) 53   Temp 97.8 F (36.6 C) (Oral)   Resp 16   Ht 5\' 10"  (1.778 m)   Wt 171 lb (77.6 kg)   BMI 24.54 kg/m   Wt Readings from Last 3 Encounters:  02/10/17 171 lb (77.6 kg)  12/20/16 168 lb 6.4 oz (76.4 kg)  11/07/16 169 lb (76.7 kg)    Physical Exam  Constitutional: He is oriented to person, place, and time. He appears well-developed and well-nourished. No distress.  Well-appearing, comfortable, cooperative  HENT:  Head: Normocephalic and atraumatic.  Mouth/Throat: Oropharynx is clear  and moist.  Eyes: Conjunctivae are normal. Right eye exhibits no discharge. Left eye exhibits no discharge.  Cardiovascular: Normal rate.   Pulmonary/Chest: Effort normal.  Musculoskeletal: He exhibits no edema.  Neurological: He is alert and oriented to person, place, and time.  Skin: Skin is warm and dry. No rash noted. He is not diaphoretic. No erythema (No surrounding erythema to posterior scalp scab).  Scalp / posterior with 2-3 cm semi firm scab-like formation matted with hair, slightly tender, no extending erythema, no fluctuance, no drainage or bleeding today.  Psychiatric: He has a normal mood and affect. His behavior is normal.  Well groomed, good eye contact, normal speech and thoughts  Nursing note and vitals reviewed.    Scalp, posterior TODAY - compared to last image from last visit     Results for orders placed or performed in visit on 95/09/32  BASIC METABOLIC PANEL WITH GFR  Result Value Ref Range   Sodium 136 135 - 146 mmol/L   Potassium 4.6 3.5 - 5.3 mmol/L   Chloride 97 (L) 98 - 110 mmol/L   CO2 24 20 - 31 mmol/L   Glucose, Bld 171 (H) 65 - 99 mg/dL   BUN 23 7 - 25 mg/dL   Creat 1.24 (H) 0.70 - 1.11 mg/dL   Calcium 9.7 8.6 - 10.3 mg/dL   GFR, Est African American 63 >=60 mL/min   GFR, Est Non  African American 55 (L) >=60 mL/min  POCT HgB A1C  Result Value Ref Range   Hemoglobin A1C 7.1 (A) 5.7      Assessment & Plan:   Problem List Items Addressed This Visit    None    Visit Diagnoses    Laceration of scalp, subsequent encounter    -  Primary  Concern since unresolved poor healing scalp laceration, still with recurrent scabbing. No evidence of secondary infection. Concern patient is diabetic. - Start Mupirocin antibiotic ointment twice daily for 7-10 days more preventative - Referral to [place], for second opinion and further wound management - Return criteria reviewed    Relevant Medications   mupirocin ointment (BACTROBAN) 2 %   Other  Relevant Orders   Ambulatory referral to [place] ordered this encounter  Medications  .               . mupirocin ointment (BACTROBAN) 2 %    Sig: Apply 1 application topically 2 (two) times daily. For 7-10 days    Dispense:  22 g    Refill:  0    Apply topically, not nasal    Follow up plan: Return in about 3 weeks (around 03/03/2017), or if symptoms worsen or fail to improve, for Scalp laceration, not healing.  Nobie Putnam, [facility] 02/10/2017, 11:47 AM

## 2017-02-12 ENCOUNTER — Encounter: Payer: Medicare Other | Attending: Internal Medicine | Admitting: Internal Medicine

## 2017-02-12 DIAGNOSIS — I10 Essential (primary) hypertension: Secondary | ICD-10-CM | POA: Insufficient documentation

## 2017-02-12 DIAGNOSIS — E1140 Type 2 diabetes mellitus with diabetic neuropathy, unspecified: Secondary | ICD-10-CM | POA: Insufficient documentation

## 2017-02-12 DIAGNOSIS — S0101XD Laceration without foreign body of scalp, subsequent encounter: Secondary | ICD-10-CM | POA: Diagnosis not present

## 2017-02-12 DIAGNOSIS — Z8546 Personal history of malignant neoplasm of prostate: Secondary | ICD-10-CM | POA: Diagnosis not present

## 2017-02-12 DIAGNOSIS — Z87891 Personal history of nicotine dependence: Secondary | ICD-10-CM | POA: Insufficient documentation

## 2017-02-12 DIAGNOSIS — S0101XA Laceration without foreign body of scalp, initial encounter: Secondary | ICD-10-CM | POA: Diagnosis not present

## 2017-02-12 DIAGNOSIS — W19XXXD Unspecified fall, subsequent encounter: Secondary | ICD-10-CM | POA: Insufficient documentation

## 2017-02-12 DIAGNOSIS — Z7984 Long term (current) use of oral hypoglycemic drugs: Secondary | ICD-10-CM | POA: Insufficient documentation

## 2017-02-13 DIAGNOSIS — S0100XA Unspecified open wound of scalp, initial encounter: Secondary | ICD-10-CM | POA: Diagnosis not present

## 2017-02-13 DIAGNOSIS — Z7984 Long term (current) use of oral hypoglycemic drugs: Secondary | ICD-10-CM | POA: Diagnosis not present

## 2017-02-13 DIAGNOSIS — Z8546 Personal history of malignant neoplasm of prostate: Secondary | ICD-10-CM | POA: Diagnosis not present

## 2017-02-13 DIAGNOSIS — Z602 Problems related to living alone: Secondary | ICD-10-CM | POA: Diagnosis not present

## 2017-02-13 DIAGNOSIS — I10 Essential (primary) hypertension: Secondary | ICD-10-CM | POA: Diagnosis not present

## 2017-02-13 DIAGNOSIS — Z9181 History of falling: Secondary | ICD-10-CM | POA: Diagnosis not present

## 2017-02-13 DIAGNOSIS — Z87891 Personal history of nicotine dependence: Secondary | ICD-10-CM | POA: Diagnosis not present

## 2017-02-13 DIAGNOSIS — E1142 Type 2 diabetes mellitus with diabetic polyneuropathy: Secondary | ICD-10-CM | POA: Diagnosis not present

## 2017-02-14 NOTE — Progress Notes (Signed)
[name] ([id number]) Visit Report for 02/12/2017 Chief Complaint Document Details Patient Name: [name], PREVETTE Date of Service: 02/12/2017 8:00 AM Medical Record Patient Account Number: [id number] Number: Treating RN: Montey Hora Date of Birth/Sex: 1933/08/30 (82 y.o. Male) Other Clinician: [place], Treating Amour Cutrone Provider: Sheppard Coil Provider/Extender: Kristine Royal, Referring Provider: Marlena Clipper in Treatment: 0 Information Obtained from: Patient Chief Complaint 02/12/17; patient is here for laceration on his occiput she's been present since mid July 2018 Electronic Signature(s) Signed: 02/12/2017 5:33:43 PM By: Linton Ham MD Entered By: Linton Ham on 02/12/2017 09:11:50 [name] ([id number]) -------------------------------------------------------------------------------- Debridement Details Patient Name: [name] Date of Service: 02/12/2017 8:00 AM Medical Record Patient Account Number: [id number] Number: Treating RN: Montey Hora Date of Birth/Sex: 12-May-1934 (82 y.o. Male) Other Clinician: [place], Treating Ethelean Colla Provider: Sheppard Coil Provider/Extender: Kristine Royal, Referring Provider: Marlena Clipper in Treatment: 0 Debridement Performed for Wound #1 Head - Parietal Assessment: Performed By: Physician Ricard Dillon, MD Debridement: Debridement Pre-procedure Verification/Time Out Yes - 08:53 Taken: Start Time: 08:53 Pain Control: Lidocaine 4% Topical Solution Level: Skin/Subcutaneous Tissue Total Area Debrided (L x 1.3 (cm) x 1.7 (cm) = 2.21 (cm) W): Tissue and other Viable, Non-Viable, Fibrin/Slough, Subcutaneous material debrided: Instrument: Curette Bleeding: Minimum Hemostasis Achieved: Silver Nitrate End Time: 08:56 Procedural Pain: 0 Post Procedural Pain: 0 Response to Treatment: Procedure was tolerated well Post  Debridement Measurements of Total Wound Length: (cm) 1.3 Width: (cm) 1.7 Depth: (cm) 0.2 Volume: (cm) 0.347 Character of Wound/Ulcer Post Improved Debridement: Post Procedure Diagnosis Same as Pre-procedure Electronic Signature(s) Signed: 02/12/2017 5:33:43 PM By: Linton Ham MD Signed: 02/13/2017 4:38:09 PM By: Gus Height ([id number]) Entered By: Linton Ham on 02/12/2017 09:11:24 [name] ([id number]) -------------------------------------------------------------------------------- HPI Details Patient Name: [name] Date of Service: 02/12/2017 8:00 AM Medical Record Patient Account Number: [id number] Number: Treating RN: Montey Hora Date of Birth/Sex: [birth year] (83 y.o. Male) Other Clinician: [place], Treating Martena Emanuele Provider: Sheppard Coil Provider/Extender: Kristine Royal, Referring Provider: Marlena Clipper in Treatment: 0 History of Present Illness HPI Description: 02/12/17; this is an 83 year old man who lives in an independent retirement home [facility]" he apparently had a syncopal spell while working outside in the heat on 12/13/16 ultimately he suffered a laceration the scalp. I don't see that this was actually sutured. He saw his primary physician on 12/20/16 noted to have a superficial abrasion at 2-3 cm and he was recommended topical antibiotic cream. He saw his primary doctor again on 02/10/17 as the laceration was not healing and was prescribed Bactroban cream which she has been applying for the last 2 days. I think he is having some difficulty given the location of this wound and the fact that it is hair is macerated over the wound. His syncopal spell was felt to be secondary to dehydration. 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 Last hemoglobin is A1c was 7.1 which for an 83 year old is really quite  reasonable control Electronic Signature(s) Signed: 02/12/2017 5:33:43 PM By: Linton Ham MD Entered By: Linton Ham on 02/12/2017 09:25:49 ARIYAN, BRISENDINE ([id number]) -------------------------------------------------------------------------------- Physical Exam Details Patient Name: [name] Date of Service: 02/12/2017 8:00 AM Medical Record Patient Account Number: [id number] Number: Treating RN: Montey Hora Date of Birth/Sex: November 23, 1933 (82 y.o. Male) Other Clinician: [place], Treating Boris Engelmann Provider: Sheppard Coil Provider/Extender: Darnell Level  KARAMALEGOS, Referring Provider: Marlena Clipper in Treatment: 0 Constitutional Patient is hypertensive.. Pulse regular and within target range for patient.Marland Kitchen Respirations regular, non-labored and within target range.. Temperature is normal and within the target range for the patient.Marland Kitchen appears in no distress. Eyes Conjunctivae clear. No discharge. Respiratory Respiratory effort is easy and symmetric bilaterally. Rate is normal at rest and on room air.. Bilateral breath sounds are clear and equal in all lobes with no wheezes, rales or rhonchi.. Cardiovascular Heart rhythm and rate regular, without murmur or gallop. no carotid bruits. Integumentary (Hair, Skin) No rashes seen. No systemic skin issues. Psychiatric No evidence of depression, anxiety, or agitation. Calm, cooperative, and communicative. Appropriate interactions and affect.. Notes Wound exam; the patient had a quarter-sized wound on the occiput of his scalp. Very significant amount of hyper-granulation. His hair was matted to the wound. Some of the hair was removed by the primary intake nurse today and I remove the rest when I came in the room. Matted hair should no longer be a problem addressing this. Then using a #5 curet I removed a significant amount of hyper-granulation down to the skin surface. Hemostasis with silver nitrate  which added to the debridement. He tolerated the procedure well Electronic Signature(s) Signed: 02/12/2017 5:33:43 PM By: Linton Ham MD Entered By: Linton Ham on 02/12/2017 09:27:47 [name] ([id number]) -------------------------------------------------------------------------------- Physician Orders Details Patient Name: [name] Date of Service: 02/12/2017 8:00 AM Medical Record Patient Account Number: [id number] Number: Treating RN: Montey Hora Date of Birth/Sex: 02-06-1934 (82 y.o. Male) Other Clinician: [place], Treating Neida Ellegood Provider: Sheppard Coil Provider/Extender: Kristine Royal, Referring Provider: Marlena Clipper in Treatment: 0 Verbal / Phone Orders: No Diagnosis Coding Wound Cleansing Wound #1 Head - Parietal o Clean wound with Normal Saline. o May Shower, gently pat wound dry prior to applying new dressing. o Other: - May wash hair with gentle shampoo Anesthetic Wound #1 Head - Parietal o Topical Lidocaine 4% cream applied to wound bed prior to debridement Primary Wound Dressing Wound #1 Head - Parietal o Hydrafera Blue Secondary Dressing Wound #1 Head - Parietal o Boardered Foam Dressing - or telfa island dressing Dressing Change Frequency Wound #1 Head - Parietal o Change Dressing Monday, Wednesday, Friday Follow-up Appointments Wound #1 Head - Parietal o Return Appointment in 1 week. Additional Orders / Instructions Wound #1 Head - Parietal o Increase protein intake. o Other: - Please add vitamin A, vitamin C and zinc supplements to your diet [place] ([id number]) Wound #1 Head - Parietal o [place] for [place] Nurse may visit PRN to address patientos wound care needs. o FACE TO FACE ENCOUNTER: MEDICARE and MEDICAID PATIENTS: I certify that this patient is under my care and that I had a face-to-face encounter  that meets the physician face-to-face encounter requirements with this patient on this date. The encounter with the patient was in whole or in part for the following MEDICAL CONDITION: (primary reason for [place]) MEDICAL NECESSITY: I certify, that based on my findings, NURSING services are a medically necessary home health service. HOME BOUND STATUS: I certify that my clinical findings support that this patient is homebound (i.e., Due to illness or injury, pt requires aid of supportive devices such as crutches, cane, wheelchairs, walkers, the use of special transportation or the assistance of another person to leave their place of residence. There is a normal inability to leave the home and doing so requires considerable  and taxing effort. Other absences are for medical reasons / religious services and are infrequent or of short duration when for other reasons). o If current dressing causes regression in wound condition, may D/C ordered dressing product/s and apply Normal Saline Moist Dressing daily until next [place] / Other MD appointment. [place] of regression in wound condition at [phone]. o Please direct any NON-WOUND related issues/requests for orders to patient's Primary Care Physician Electronic Signature(s) Signed: 02/12/2017 5:33:43 PM By: Linton Ham MD Signed: 02/13/2017 4:38:09 PM By: Montey Hora Entered By: Montey Hora on 02/12/2017 08:59:50 [place], Arvella Ryan ([id number]) -------------------------------------------------------------------------------- Problem List Details Patient Name: [name] Date of Service: 02/12/2017 8:00 AM Medical Record Patient Account Number: [id number] Number: Treating RN: Montey Hora Date of Birth/Sex: 1934-05-04 (83 y.o. Male) Other Clinician: [place], Treating Marranda Arakelian Provider: Sheppard Coil Provider/Extender: Kristine Royal, Referring  Provider: Marlena Clipper in Treatment: 0 Active Problems ICD-10 Encounter Code Description Active Date Diagnosis S01.01XD Laceration without foreign body of scalp, subsequent 02/12/2017 Yes encounter E11.40 Type 2 diabetes mellitus with diabetic neuropathy, 02/12/2017 Yes unspecified Inactive Problems Resolved Problems Electronic Signature(s) Signed: 02/12/2017 5:33:43 PM By: Linton Ham MD Entered By: Linton Ham on 02/12/2017 09:11:01 [place], Arvella Ryan ([id number]) -------------------------------------------------------------------------------- Progress Note Details Patient Name: [name] Date of Service: 02/12/2017 8:00 AM Medical Record Patient Account Number: [id number] Number: Treating RN: Montey Hora Date of Birth/Sex: 10-22-1933 (83 y.o. Male) Other Clinician: [place], Treating Tekila Caillouet Provider: Sheppard Coil Provider/Extender: Kristine Royal, Referring Provider: Marlena Clipper in Treatment: 0 Subjective Chief Complaint Information obtained from Patient 02/12/17; patient is here for laceration on his occiput she's been present since mid July 2018 History of Present Illness (HPI) 02/12/17; this is an 83 year old man who lives in an independent retirement home "[facility]" he apparently had a syncopal spell while working outside in the heat on 12/13/16 ultimately he suffered a laceration the scalp. I don't see that this was actually sutured. He saw his primary physician on 12/20/16 noted to have a superficial abrasion at 2-3 cm and he was recommended topical antibiotic cream. He saw his primary doctor again on 02/10/17 as the laceration was not healing and was prescribed Bactroban cream which she has been applying for the last 2 days. I think he is having some difficulty given the location of this wound and the fact that it is hair is macerated over the wound. His syncopal spell was felt to be secondary to dehydration. 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 Last hemoglobin is A1c was 7.1 which for an 83 year old is really quite reasonable control Wound History Patient presents with 1 open wound that has been present for approximately 2 months. Patient has been treating wound in the following manner: mupirocin. Laboratory tests have not been performed in the last month. Patient reportedly has not tested positive for an antibiotic resistant organism. Patient reportedly has not tested positive for osteomyelitis. Patient reportedly has not had testing performed to evaluate circulation in the legs. Patient History Information obtained from Patient. Allergies No Known Allergies Family History Cancer - Mother, Heart Disease - Father, Hypertension - Father, Lung Disease - Father, Siblings, No family history of Diabetes, Hereditary Spherocytosis, Kidney Disease, Seizures, Stroke, Thyroid Jonsson, Lorenzo F. ([id number]) Problems, Tuberculosis. Social History Former smoker - quit 25+ years ago, Marital Status - Widowed, Alcohol Use - Never, Drug Use -  No History, Caffeine Use - Daily. Medical History Cardiovascular Patient has history of Hypertension Endocrine Patient has history of Type II Diabetes Neurologic Patient has history of Neuropathy Patient is treated with Oral Agents. Hospitalization/Surgery History - 12/13/2016, [facility], fall with head wound and dehydration. Review of Systems (ROS) Constitutional Symptoms (General Health) The patient has no complaints or symptoms. Eyes The patient has no complaints or symptoms. Ear/Nose/Mouth/Throat The patient has no complaints or symptoms. Hematologic/Lymphatic The patient has no complaints or symptoms. Respiratory The patient has no complaints or symptoms. Gastrointestinal The patient has no complaints or symptoms. Genitourinary The patient has no complaints or  symptoms. Immunological The patient has no complaints or symptoms. Integumentary (Skin) The patient has no complaints or symptoms. Musculoskeletal The patient has no complaints or symptoms. Neurologic The patient has no complaints or symptoms. Oncologic The patient has no complaints or symptoms. Psychiatric The patient has no complaints or symptoms. VERA, WISHART ([id number]) Objective Constitutional Patient is hypertensive.. Pulse regular and within target range for patient.Marland Kitchen Respirations regular, non-labored and within target range.. Temperature is normal and within the target range for the patient.Marland Kitchen appears in no distress. Vitals Time Taken: 8:14 AM, Height: 69 in, Source: Measured, Weight: 172 lbs, Source: Measured, BMI: 25.4, Temperature: 98.6 F, Pulse: 64 bpm, Respiratory Rate: 18 breaths/min, Blood Pressure: 153/77 mmHg. Eyes Conjunctivae clear. No discharge. Respiratory Respiratory effort is easy and symmetric bilaterally. Rate is normal at rest and on room air.. Bilateral breath sounds are clear and equal in all lobes with no wheezes, rales or rhonchi.. Cardiovascular Heart rhythm and rate regular, without murmur or gallop. no carotid bruits. Psychiatric No evidence of depression, anxiety, or agitation. Calm, cooperative, and communicative. Appropriate interactions and affect.. General Notes: Wound exam; the patient had a quarter-sized wound on the occiput of his scalp. Very significant amount of hyper-granulation. His hair was matted to the wound. Some of the hair was removed by the primary intake nurse today and I remove the rest when I came in the room. Matted hair should no longer be a problem addressing this. Then using a #5 curet I removed a significant amount of hyper- granulation down to the skin surface. Hemostasis with silver nitrate which added to the debridement. He tolerated the procedure well Integumentary (Hair, Skin) No rashes seen. No systemic  skin issues. Wound #1 status is Open. Original cause of wound was Trauma. The wound is located on the Head - Parietal. The wound measures 1.3cm length x 1.7cm width x 0.1cm depth; 1.736cm^2 area and 0.174cm^3 volume. There is Fat Layer (Subcutaneous Tissue) Exposed exposed. There is no tunneling or undermining noted. There is a large amount of serous drainage noted. The wound margin is flat and intact. There is large (67-100%) red, hyper - granulation within the wound bed. There is no necrotic tissue within the wound bed. The periwound skin appearance did not exhibit: Callus, Crepitus, Excoriation, Induration, Rash, Scarring, Dry/Scaly, Maceration, Atrophie Blanche, Cyanosis, Ecchymosis, Hemosiderin Staining, Mottled, Pallor, Rubor, Erythema. Periwound temperature was noted as No Abnormality. The periwound has tenderness on palpation. ZORAWAR, STROLLO ([id number]) Assessment Active Problems ICD-10 S01.01XD - Laceration without foreign body of scalp, subsequent encounter E11.40 - Type 2 diabetes mellitus with diabetic neuropathy, unspecified Procedures Wound #1 Pre-procedure diagnosis of Wound #1 is a Trauma, Other located on the Head - Parietal . There was a Skin/Subcutaneous Tissue Debridement ([id number]) debridement with total area of 2.21 sq cm performed by Ricard Dillon, MD. with the following instrument(s): Curette to remove  Viable and Non-Viable tissue/material including Fibrin/Slough and Subcutaneous after achieving pain control using Lidocaine 4% Topical Solution. A time out was conducted at 08:53, prior to the start of the procedure. A Minimum amount of bleeding was controlled with Silver Nitrate. The procedure was tolerated well with a pain level of 0 throughout and a pain level of 0 following the procedure. Post Debridement Measurements: 1.3cm length x 1.7cm width x 0.2cm depth; 0.347cm^3 volume. Character of Wound/Ulcer Post Debridement is improved. Post procedure  Diagnosis Wound #1: Same as Pre-Procedure Plan Wound Cleansing: Wound #1 Head - Parietal: Clean wound with Normal Saline. May Shower, gently pat wound dry prior to applying new dressing. Other: - May wash hair with gentle shampoo Anesthetic: Wound #1 Head - Parietal: Topical Lidocaine 4% cream applied to wound bed prior to debridement Primary Wound Dressing: Wound #1 Head - Parietal: Hydrafera Blue Secondary Dressing: Wound #1 Head - Parietal: [place], Klyde F. ([id number]) Boardered Foam Dressing - or telfa island dressing Dressing Change Frequency: Wound #1 Head - Parietal: Change Dressing Monday, Wednesday, Friday Follow-up Appointments: Wound #1 Head - Parietal: Return Appointment in 1 week. Additional Orders / Instructions: Wound #1 Head - Parietal: Increase protein intake. Other: - Please add vitamin A, vitamin C and zinc supplements to your diet Home Health: Wound #1 Head - Parietal: [place] for [place] Nurse may visit PRN to address patient s wound care needs. FACE TO FACE ENCOUNTER: MEDICARE and MEDICAID PATIENTS: I certify that this patient is under my care and that I had a face-to-face encounter that meets the physician face-to-face encounter requirements with this patient on this date. The encounter with the patient was in whole or in part for the following MEDICAL CONDITION: (primary reason for [place]) MEDICAL NECESSITY: I certify, that based on my findings, NURSING services are a medically necessary home health service. HOME BOUND STATUS: I certify that my clinical findings support that this patient is homebound (i.e., Due to illness or injury, pt requires aid of supportive devices such as crutches, cane, wheelchairs, walkers, the use of special transportation or the assistance of another person to leave their place of residence. There is a normal inability to leave the home and doing so requires considerable and taxing  effort. Other absences are for medical reasons / religious services and are infrequent or of short duration when for other reasons). If current dressing causes regression in wound condition, may D/C ordered dressing product/s and apply Normal Saline Moist Dressing daily until next [place] / Other MD appointment. [place] of regression in wound condition at [phone]. Please direct any NON-WOUND related issues/requests for orders to patient's Primary Care Physician #1 related to the hyper-granulation we used Hydrofera Blue as the primary dressing. #2 this patient lives in the independent retirement community and he would have difficulty dressing this himself we will therefore try to get home health to change the dressings 2 times and we will see him next week. #3 with removal of the hair, better access for dressings and removal of the hyper-granulation I am thinking this wound should progress nicely towards healing. There was nothing about the area that looked ominous in terms of infection or malignancy Electronic Signature(s) Signed: 02/12/2017 5:33:43 PM By: Linton Ham MD Entered By: Linton Ham on 02/12/2017 09:28:48 KIREE, DEJARNETTE ([id number]) [place], Arvella Ryan ([id number]) -------------------------------------------------------------------------------- ROS/PFSH Details Patient Name: [name] Date of Service: 02/12/2017 8:00 AM Medical Record Patient Account Number: [id number]  Number: Treating RN: Montey Hora Date of Birth/Sex: Oct 19, 1933 (82 y.o. Male) Other Clinician: [place], Treating Kassius Battiste, [place] Provider: Sheppard Coil Provider/Extender: Kristine Royal, Referring Provider: Marlena Clipper in Treatment: 0 Information Obtained From Patient Wound History Do you currently have one or more open woundso Yes How many open wounds do you currently haveo 1 Approximately how long have you had your  woundso 2 months How have you been treating your wound(s) until nowo mupirocin Has your wound(s) ever healed and then re-openedo No Have you had any lab work done in the past montho No Have you tested positive for an antibiotic resistant organism (MRSA, VRE)o No Have you tested positive for osteomyelitis (bone infection)o No Have you had any tests for circulation on your legso No Constitutional Symptoms (General Health) Complaints and Symptoms: No Complaints or Symptoms Eyes Complaints and Symptoms: No Complaints or Symptoms Ear/Nose/Mouth/Throat Complaints and Symptoms: No Complaints or Symptoms Hematologic/Lymphatic Complaints and Symptoms: No Complaints or Symptoms Respiratory Complaints and Symptoms: No Complaints or Symptoms Cardiovascular DAKWAN, PRIDGEN. ([id number]) Medical History: Positive for: Hypertension Gastrointestinal Complaints and Symptoms: No Complaints or Symptoms Endocrine Medical History: Positive for: Type II Diabetes Treated with: Oral agents Genitourinary Complaints and Symptoms: No Complaints or Symptoms Immunological Complaints and Symptoms: No Complaints or Symptoms Integumentary (Skin) Complaints and Symptoms: No Complaints or Symptoms Musculoskeletal Complaints and Symptoms: No Complaints or Symptoms Neurologic Complaints and Symptoms: No Complaints or Symptoms Medical History: Positive for: Neuropathy Oncologic Complaints and Symptoms: No Complaints or Symptoms Psychiatric Complaints and Symptoms: No Complaints or Symptoms [place], Rexford F. ([id number]) Immunizations Pneumococcal Vaccine: Received Pneumococcal Vaccination: Yes Immunization Notes: up to date Hospitalization / Surgery History Name of Hospital Purpose of Hospitalization/Surgery Date [facility] fall with head wound and dehydration 12/13/2016 Family and Social History Cancer: Yes - Mother; Diabetes: No; Heart Disease: Yes - Father; Hereditary Spherocytosis:  No; Hypertension: Yes - Father; Kidney Disease: No; Lung Disease: Yes - Father, Siblings; Seizures: No; Stroke: No; Thyroid Problems: No; Tuberculosis: No; Former smoker - quit 25+ years ago; Marital Status - Widowed; Alcohol Use: Never; Drug Use: No History; Caffeine Use: Daily; Financial Concerns: No; Food, Clothing or Shelter Needs: No; Support System Lacking: No; Transportation Concerns: No; Advanced Directives: No; Patient does not want information on Advanced Directives Electronic Signature(s) Signed: 02/12/2017 5:33:43 PM By: Linton Ham MD Signed: 02/13/2017 4:38:09 PM By: Montey Hora Entered By: Montey Hora on 02/12/2017 08:20:17 [name] ([id number]) -------------------------------------------------------------------------------- SuperBill Details Patient Name: [name] Date of Service: 02/12/2017 Medical Record Patient Account Number: [id number] Number: Treating RN: Montey Hora Date of Birth/Sex: 1934-04-04 (83 y.o. Male) Other Clinician: [employer], Treating Ostin Mathey Provider: Sheppard Coil Provider/Extender: Kristine Royal, Weeks in Treatment: 0 Referring Provider: Sheppard Coil Diagnosis Coding ICD-10 Codes Code Description S01.01XD Laceration without foreign body of scalp, subsequent encounter E11.40 Type 2 diabetes mellitus with diabetic neuropathy, unspecified Facility Procedures CPT4 Code Description: [id number] - WOUND CARE VISIT-LEV 3 EST PT Modifier: Quantity: 1 CPT4 Code Description: 30160109 11042 - DEB SUBQ TISSUE 20 SQ CM/< ICD-10 Description Diagnosis S01.01XD Laceration without foreign body of scalp, subsequ Modifier: ent encounte Quantity: 1 r Physician Procedures CPT4 Code Description: 3235573 WC PHYS LEVEL 3 o NEW PT ICD-10 Description Diagnosis S01.01XD Laceration without foreign body of scalp, subsequ Modifier: 25 ent encounte Quantity: 1 r CPT4 Code Description: 2202542 70623 - WC PHYS SUBQ  TISS 20 SQ CM ICD-10 Description Diagnosis S01.01XD Laceration without foreign body of scalp, subsequ Modifier: ent encounte Quantity: 1  r Electronic Signature(s) Signed: 02/12/2017 5:33:43 PM By: Linton Ham MD Signed: 02/13/2017 4:38:09 PM By: Montey Hora Entered By: Montey Hora on 02/12/2017 16:57:58

## 2017-02-14 NOTE — Progress Notes (Signed)
OLAF, MESA ([id number]) Visit Report for 02/12/2017 Allergy List Details Patient Name: [name], [name] Date of Service: 02/12/2017 8:00 AM Medical Record Patient Account Number: [id number] Number: Treating RN: Montey Hora Date of Birth/Sex: Oct 22, 1933 (82 y.o. Male) Other Clinician: Scherry Ran Primary Care Binnie Vonderhaar: Sheppard Coil Dalvin Clipper/Extender: Kristine Royal, Referring Moneisha Vosler: Marlena Clipper in Treatment: 0 Allergies Active Allergies No Known Allergies Allergy Notes Electronic Signature(s) Signed: 02/13/2017 4:38:09 PM By: Montey Hora Entered By: Montey Hora on 02/12/2017 08:11:50 [name], [name] ([id number]) -------------------------------------------------------------------------------- Arrival Information Details Patient Name: [name] Date of Service: 02/12/2017 8:00 AM Medical Record Patient Account Number: [id number] Number: Treating RN: Montey Hora Date of Birth/Sex: [birth year] (82 y.o. Male) Other Clinician: Scherry Ran Primary Care Daysia Vandenboom: Sheppard Coil Josceline Chenard/Extender: Kristine Royal, Referring Lenox Bink: Marlena Clipper in Treatment: 0 Visit Information Patient Arrived: Ambulatory Arrival Time: 08:08 Accompanied By: self Transfer Assistance: None Patient Identification Verified: Yes Secondary Verification Process Yes Completed: Patient Has Alerts: Yes Patient Alerts: DM II Electronic Signature(s) Signed: 02/13/2017 4:38:09 PM By: Montey Hora Entered By: Montey Hora on 02/12/2017 08:11:03 [name] ([id number]) -------------------------------------------------------------------------------- Clinic Level of Care Assessment Details Patient Name: [name] Date of Service: 02/12/2017 8:00 AM Medical Record Patient Account Number: [id number] Number: Treating RN: Montey Hora Date of Birth/Sex: [birth year] (82 y.o.  Male) Other Clinician: Scherry Ran Primary Care Marlos Carmen: Sheppard Coil Estefanny Moler/Extender: Kristine Royal, Referring Jw Covin: Marlena Clipper in Treatment: 0 Clinic Level of Care Assessment Items TOOL 1 Quantity Score []  - Use when EandM and Procedure is performed on INITIAL visit 0 ASSESSMENTS - Nursing Assessment / Reassessment X - General Physical Exam (combine w/ comprehensive assessment (listed just 1 20 below) when performed on new pt. evals) X - Comprehensive Assessment (HX, ROS, Risk Assessments, Wounds Hx, etc.) 1 25 ASSESSMENTS - Wound and Skin Assessment / Reassessment []  - Dermatologic / Skin Assessment (not related to wound area) 0 ASSESSMENTS - Ostomy and/or Continence Assessment and Care []  - Incontinence Assessment and Management 0 []  - Ostomy Care Assessment and Management (repouching, etc.) 0 PROCESS - Coordination of Care X - Simple Patient / Family Education for ongoing care 1 15 []  - Complex (extensive) Patient / Family Education for ongoing care 0 X - Staff obtains Programmer, systems, Records, Test Results / Process Orders 1 10 []  - Staff telephones HHA, Nursing Homes / Clarify orders / etc 0 []  - Routine Transfer to another Facility (non-emergent condition) 0 []  - Routine Hospital Admission (non-emergent condition) 0 X - New Admissions / Biomedical engineer / Ordering NPWT, Apligraf, etc. 1 15 []  - Emergency Hospital Admission (emergent condition) 0 PROCESS - Special Needs []  - Pediatric / Minor Patient Management 0 Brunton, Lional F. ([id number]) []  - Isolation Patient Management 0 []  - Hearing / Language / Visual special needs 0 []  - Assessment of Community assistance (transportation, D/C planning, etc.) 0 []  - Additional assistance / Altered mentation 0 []  - Support Surface(s) Assessment (bed, cushion, seat, etc.) 0 INTERVENTIONS - Miscellaneous []  - External ear exam 0 []  - Patient Transfer (multiple staff / Civil Service fast streamer / Similar  devices) 0 []  - Simple Staple / Suture removal (25 or less) 0 []  - Complex Staple / Suture removal (26 or more) 0 []  - Hypo/Hyperglycemic Management (do not check if billed separately) 0 []  - Ankle / Brachial Index (ABI) - do not check if billed separately 0 Has the patient been seen at the hospital within the last three years: Yes  Total Score: 85 Level Of Care: New/Established - Level 3 Electronic Signature(s) Signed: 02/13/2017 4:38:09 PM By: Montey Hora Entered By: Montey Hora on 02/12/2017 16:57:49 [name], [name] ([id number]) -------------------------------------------------------------------------------- Encounter Discharge Information Details Patient Name: [name] Date of Service: 02/12/2017 8:00 AM Medical Record Patient Account Number: [id number] Number: Treating RN: Montey Hora Date of Birth/Sex: 1933/12/28 (82 y.o. Male) Other Clinician: Scherry Ran Primary Care Tianna Baus: Sheppard Coil Derl Abalos/Extender: Kristine Royal, Referring Mikai Meints: Marlena Clipper in Treatment: 0 Encounter Discharge Information Items Discharge Pain Level: 0 Discharge Condition: Stable Ambulatory Status: Ambulatory Discharge Destination: Home Transportation: Private Auto Accompanied By: self Schedule Follow-up Appointment: Yes Medication Reconciliation completed and provided to Patient/Care No Michiah Masse: Provided on Clinical Summary of Care: 02/12/2017 Form Type Recipient Paper Patient JC Electronic Signature(s) Signed: 02/13/2017 4:38:09 PM By: Montey Hora Entered By: Montey Hora on 02/12/2017 16:58:34 [name], [name] ([id number]) -------------------------------------------------------------------------------- Multi Wound Chart Details Patient Name: [name] Date of Service: 02/12/2017 8:00 AM Medical Record Patient Account Number: [id number] Number: Treating RN: Montey Hora Date of Birth/Sex: 1933/11/13  (82 y.o. Male) Other Clinician: Scherry Ran Primary Care Annalisa Colonna: Sheppard Coil Jacoby Zanni/Extender: Kristine Royal, Referring De Libman: Marlena Clipper in Treatment: 0 Vital Signs Height(in): 69 Pulse(bpm): 64 Weight(lbs): 172 Blood Pressure 153/77 (mmHg): Body Mass Index(BMI): 25 Temperature(F): 98.6 Respiratory Rate 18 (breaths/min): Photos: [1:No Photos] [N/A:N/A] Wound Location: [1:Head - Parietal] [N/A:N/A] Wounding Event: [1:Trauma] [N/A:N/A] Primary Etiology: [1:Trauma, Other] [N/A:N/A] Comorbid History: [1:Hypertension, Type II Diabetes, Neuropathy] [N/A:N/A] Date Acquired: [1:12/13/2016] [N/A:N/A] Weeks of Treatment: [1:0] [N/A:N/A] Wound Status: [1:Open] [N/A:N/A] Measurements L x W x D 1.3x1.7x0.1 [N/A:N/A] (cm) Area (cm) : [1:1.736] [N/A:N/A] Volume (cm) : [1:0.174] [N/A:N/A] Classification: [1:Full Thickness Without Exposed Support Structures] [N/A:N/A] Exudate Amount: [1:Large] [N/A:N/A] Exudate Type: [1:Serous] [N/A:N/A] Exudate Color: [1:amber] [N/A:N/A] Wound Margin: [1:Flat and Intact] [N/A:N/A] Granulation Amount: [1:Large (67-100%)] [N/A:N/A] Granulation Quality: [1:Red, Hyper-granulation] [N/A:N/A] Necrotic Amount: [1:None Present (0%)] [N/A:N/A] Exposed Structures: [1:Fat Layer (Subcutaneous Tissue) Exposed: Yes Fascia: No Tendon: No] [N/A:N/A] Muscle: No Joint: No Bone: No Epithelialization: None N/A N/A Debridement: Debridement (41740- N/A N/A 11047) Pre-procedure 08:53 N/A N/A Verification/Time Out Taken: Pain Control: Lidocaine 4% Topical N/A N/A Solution Tissue Debrided: Fibrin/Slough, N/A N/A Subcutaneous Level: Skin/Subcutaneous N/A N/A Tissue Debridement Area (sq 2.21 N/A N/A cm): Instrument: Curette N/A N/A Bleeding: Minimum N/A N/A Hemostasis Achieved: Silver Nitrate N/A N/A Procedural Pain: 0 N/A N/A Post Procedural Pain: 0 N/A N/A Debridement Treatment Procedure was tolerated N/A N/A Response:  well Post Debridement 1.3x1.7x0.2 N/A N/A Measurements L x W x D (cm) Post Debridement 0.347 N/A N/A Volume: (cm) Periwound Skin Texture: Excoriation: No N/A N/A Induration: No Callus: No Crepitus: No Rash: No Scarring: No Periwound Skin Maceration: No N/A N/A Moisture: Dry/Scaly: No Periwound Skin Color: Atrophie Blanche: No N/A N/A Cyanosis: No Ecchymosis: No Erythema: No Hemosiderin Staining: No Mottled: No Pallor: No Rubor: No Temperature: No Abnormality N/A N/A Tenderness on Yes N/A N/A Palpation: Wound Preparation: Ulcer Cleansing: N/A N/A Rinsed/Irrigated with [name], Bryan F. ([id number]) Saline Topical Anesthetic Applied: Other: lidocaine 4% Procedures Performed: Debridement N/A N/A Treatment Notes Electronic Signature(s) Signed: 02/12/2017 5:33:43 PM By: Linton Ham MD Entered By: Linton Ham on 02/12/2017 09:11:08 [name], [name] ([id number]) -------------------------------------------------------------------------------- Multi-Disciplinary Care Plan Details Patient Name: [name] Date of Service: 02/12/2017 8:00 AM Medical Record Patient Account Number: [id number] Number: Treating RN: Montey Hora Date of Birth/Sex: 1933-10-13 (83 y.o. Male) Other Clinician: Scherry Ran Primary Care Jamira Barfuss:  ALEXANDER Sheyna Pettibone/Extender: Kristine Royal, Referring [place] Flight: Marlena Clipper in Treatment: 0 Active Inactive ` Abuse / Safety / Falls / Self Care Management Nursing Diagnoses: Potential for falls Goals: Patient will remain injury free related to falls Date Initiated: 02/12/2017 Target Resolution Date: 05/02/2017 Goal Status: Active Interventions: Assess fall risk on admission and as needed Notes: ` Orientation to the Wound Care Program Nursing Diagnoses: Knowledge deficit related to the wound healing center program Goals: Patient/caregiver will verbalize understanding of the [facility] Program Date Initiated: 02/12/2017 Target Resolution Date: 05/02/2017 Goal Status: Active Interventions: Provide education on orientation to the wound center Notes: ` Wound/Skin Impairment [name], [place]. ([id number]) Nursing Diagnoses: Impaired tissue integrity Goals: Ulcer/skin breakdown will heal within 14 weeks Date Initiated: 02/12/2017 Target Resolution Date: 05/02/2017 Goal Status: Active Interventions: Assess patient/caregiver ability to obtain necessary supplies Assess patient/caregiver ability to perform ulcer/skin care regimen upon admission and as needed Assess ulceration(s) every visit Notes: Electronic Signature(s) Signed: 02/13/2017 4:38:09 PM By: Montey Hora Entered By: Montey Hora on 02/12/2017 08:54:30 [name], [name] ([id number]) -------------------------------------------------------------------------------- Pain Assessment Details Patient Name: [name] Date of Service: 02/12/2017 8:00 AM Medical Record Patient Account Number: [id number] Number: Treating RN: Montey Hora Date of Birth/Sex: 1933/12/11 (82 y.o. Male) Other Clinician: Scherry Ran Primary Care Tyreque Finken: Sheppard Coil Jaquille Kau/Extender: Kristine Royal, Referring Katherleen Folkes: Marlena Clipper in Treatment: 0 Active Problems Location of Pain Severity and Description of Pain Patient Has Paino No Site Locations Pain Management and Medication Current Pain Management: Notes Topical or injectable lidocaine is offered to patient for acute pain when surgical debridement is performed. If needed, Patient is instructed to use over the counter pain medication for the following 24-48 hours after debridement. Wound care MDs do not prescribed pain medications. Patient has chronic pain or uncontrolled pain. Patient has been instructed to make an appointment with their Primary Care Physician for pain management. Electronic Signature(s) Signed:  02/13/2017 4:38:09 PM By: Montey Hora Entered By: Montey Hora on 02/12/2017 08:11:12 [name] ([id number]) -------------------------------------------------------------------------------- Patient/Caregiver Education Details Patient Name: [name] Date of Service: 02/12/2017 8:00 AM Medical Record Patient Account Number: [id number] Number: Treating RN: Montey Hora Date of Birth/Gender: Jul 17, 1933 (83 y.o. Male) Other Clinician: [place], Treating ROBSON, [place] Physician: Sheppard Coil Physician/Extender: Kristine Royal, Weeks in Treatment: 0 Referring Physician: Sheppard Coil Education Assessment Education Provided To: Patient Education Topics Provided Wound/Skin Impairment: Handouts: Other: wound care as ordered and hair care Methods: Demonstration, Explain/Verbal Responses: State content correctly Electronic Signature(s) Signed: 02/13/2017 4:38:09 PM By: Montey Hora Entered By: Montey Hora on 02/12/2017 16:58:53 [name], [name] ([id number]) -------------------------------------------------------------------------------- Wound Assessment Details Patient Name: [name] Date of Service: 02/12/2017 8:00 AM Medical Record Patient Account Number: [id number] Number: Treating RN: Montey Hora Date of Birth/Sex: [birth year] (82 y.o. Male) Other Clinician: Jetty Duhamel [place] Primary Care Tonjua Rossetti: Sheppard Coil Granvil Djordjevic/Extender: Kristine Royal, Referring Khaylee Mcevoy: Marlena Clipper in Treatment: 0 Wound Status Wound Number: 1 Primary Trauma, Other Etiology: Wound Location: Head - Parietal Wound Status: Open Wounding Event: Trauma Comorbid Hypertension, Type II Diabetes, Date Acquired: 12/13/2016 History: Neuropathy Weeks Of Treatment: 0 Clustered Wound: No Photos Photo Uploaded By: Montey Hora on 02/12/2017 16:44:33 Wound Measurements Length: (cm) 1.3 Width: (cm) 1.7 Depth: (cm)  0.1 Area: (cm) 1.736 Volume: (cm) 0.174 % Reduction in Area: % Reduction in Volume: Epithelialization: None Tunneling: No Undermining: No Wound Description Full Thickness Without Exposed Classification: Support Structures Wound Margin: Flat and Intact Exudate Large Amount: Exudate Type:  Serous Exudate Color: amber Foul Odor After Cleansing: No Slough/Fibrino No Wound Bed [name], PUSTEJOVSKY. ([id number]) Granulation Amount: Large (67-100%) Exposed Structure Granulation Quality: Red, Hyper-granulation Fascia Exposed: No Necrotic Amount: None Present (0%) Fat Layer (Subcutaneous Tissue) Exposed: Yes Tendon Exposed: No Muscle Exposed: No Joint Exposed: No Bone Exposed: No Periwound Skin Texture Texture Color No Abnormalities Noted: No No Abnormalities Noted: No Callus: No Atrophie Blanche: No Crepitus: No Cyanosis: No Excoriation: No Ecchymosis: No Induration: No Erythema: No Rash: No Hemosiderin Staining: No Scarring: No Mottled: No Pallor: No Moisture Rubor: No No Abnormalities Noted: No Dry / Scaly: No Temperature / Pain Maceration: No Temperature: No Abnormality Tenderness on Palpation: Yes Wound Preparation Ulcer Cleansing: Rinsed/Irrigated with Saline Topical Anesthetic Applied: Other: lidocaine 4%, Treatment Notes Wound #1 (Head - Parietal) 1. Cleansed with: Clean wound with Normal Saline 2. Anesthetic Topical Lidocaine 4% cream to wound bed prior to debridement 4. Dressing Applied: Hydrafera Blue 5. Secondary [place] Signature(s) Signed: 02/13/2017 4:38:09 PM By: Montey Hora Entered By: Montey Hora on 02/12/2017 08:36:53 [name], [name] ([id number]) -------------------------------------------------------------------------------- Stella Details Patient Name: [name] Date of Service: 02/12/2017 8:00 AM Medical Record Patient Account Number: [id number] Number: Treating RN:  Montey Hora Date of Birth/Sex: April 21, 1934 (83 y.o. Male) Other Clinician: Jetty Duhamel [place] Primary Care Lazer Wollard: Sheppard Coil Karolynn Infantino/Extender: Kristine Royal, Referring Victorino Fatzinger: Marlena Clipper in Treatment: 0 Vital Signs Time Taken: 08:14 Temperature (F): 98.6 Height (in): 69 Pulse (bpm): 64 Source: Measured Respiratory Rate (breaths/min): 18 Weight (lbs): 172 Blood Pressure (mmHg): 153/77 Source: Measured Reference Range: 80 - 120 mg / dl Body Mass Index (BMI): 25.4 Electronic Signature(s) Signed: 02/13/2017 4:38:09 PM By: Montey Hora Entered By: Montey Hora on 02/12/2017 08:15:31

## 2017-02-14 NOTE — Progress Notes (Signed)
[name] ([id number]) Visit Report for 02/12/2017 Abuse/Suicide Risk Screen Details Patient Name: [name] Date of Service: 02/12/2017 8:00 AM Medical Record Patient Account Number: [id number] Number: Treating RN: Montey Hora Date of Birth/Sex: [birth year] (82 y.o. Male) Other Clinician: [place], Treating ROBSON, MICHAEL Shayon Trompeter: Sheppard Coil Adelei Scobey/Extender: Kristine Royal, Referring Jaquanda Wickersham: Marlena Clipper in Treatment: 0 Abuse/Suicide Risk Screen Items Answer ABUSE/SUICIDE RISK SCREEN: Has anyone close to you tried to hurt or harm you recentlyo No Do you feel uncomfortable with anyone in your familyo No Has anyone forced you do things that you didnot want to doo No Do you have any thoughts of harming yourselfo No Patient displays signs or symptoms of abuse and/or neglect. No Electronic Signature(s) Signed: 02/13/2017 4:38:09 PM By: Montey Hora Entered By: Montey Hora on 02/12/2017 08:11:59 [name] ([id number]) -------------------------------------------------------------------------------- Activities of Daily Living Details Patient Name: [name] Date of Service: 02/12/2017 8:00 AM Medical Record Patient Account Number: [id number] Number: Treating RN: Montey Hora Date of Birth/Sex: 09-04-1933 (82 y.o. Male) Other Clinician: [place], Treating ROBSON, MICHAEL Conswella Bruney: Sheppard Coil Kaytelyn Glore/Extender: Kristine Royal, Referring Donnabelle Blanchard: Marlena Clipper in Treatment: 0 Activities of Daily Living Items Answer Activities of Daily Living (Please select one for each item) Drive Automobile Completely Able Take Medications Completely Able Use Telephone Completely Able Care for Appearance Completely Able Use Toilet Completely Able Bath / Shower Completely Able Dress Self Completely Able Feed Self Completely Able Walk Completely Able Get In / Out Bed Completely Able Housework  Completely Able Prepare Meals Completely Able Handle Money Completely Able Shop for Self Completely Able Electronic Signature(s) Signed: 02/13/2017 4:38:09 PM By: Montey Hora Entered By: Montey Hora on 02/12/2017 08:12:18 [name] ([id number]) -------------------------------------------------------------------------------- Education Assessment Details Patient Name: [name] Date of Service: 02/12/2017 8:00 AM Medical Record Patient Account Number: [id number] Number: Treating RN: Montey Hora Date of Birth/Sex: 04/04/1934 (83 y.o. Male) Other Clinician: [place], Treating ROBSON, MICHAEL Kimberely Mccannon: Sheppard Coil Ashutosh Dieguez/Extender: Kristine Royal, Referring Kia Stavros: Marlena Clipper in Treatment: 0 Primary Learner Assessed: Patient Learning Preferences/Education Level/Primary Language Learning Preference: Explanation, Demonstration Highest Education Level: High School Preferred Language: English Cognitive Barrier Assessment/Beliefs Language Barrier: No Translator Needed: No Memory Deficit: No Emotional Barrier: No Cultural/Religious Beliefs Affecting Medical No Care: Physical Barrier Assessment Impaired Vision: No Impaired Hearing: No Decreased Hand dexterity: No Knowledge/Comprehension Assessment Knowledge Level: Medium Comprehension Level: Medium Ability to understand written Medium instructions: Ability to understand verbal Medium instructions: Motivation Assessment Anxiety Level: Calm Cooperation: Cooperative Education Importance: Acknowledges Need Interest in Health Problems: Asks Questions Perception: Coherent Willingness to Engage in Self- Medium Management Activities: [employer] ([id number]) Readiness to Engage in Self- Management Activities: Electronic Signature(s) Signed: 02/13/2017 4:38:09 PM By: Montey Hora Entered By: Montey Hora on 02/12/2017 08:12:44 [name]  ([id number]) -------------------------------------------------------------------------------- Fall Risk Assessment Details Patient Name: [name] Date of Service: 02/12/2017 8:00 AM Medical Record Patient Account Number: [id number] Number: Treating RN: Montey Hora Date of Birth/Sex: [birth year] (82 y.o. Male) Other Clinician: [place], Treating ROBSON, MICHAEL Ancelmo Hunt: Sheppard Coil Dierre Crevier/Extender: Kristine Royal, Referring Rosio Weiss: Marlena Clipper in Treatment: 0 Fall Risk Assessment Items Have you had 2 or more falls in the last 12 monthso 0 No Have you had any fall that resulted in injury in the last 12 monthso 0 Yes FALL RISK ASSESSMENT: History of falling - immediate or within 3 months 25 Yes Secondary diagnosis 0 No Ambulatory aid None/bed rest/wheelchair/nurse 0 Yes Crutches/cane/walker  0 No Furniture 0 No IV Access/Saline Lock 0 No Gait/Training Normal/bed rest/immobile 0 Yes Weak 0 No Impaired 0 No Mental Status Oriented to own ability 0 Yes Electronic Signature(s) Signed: 02/13/2017 4:38:09 PM By: Montey Hora Entered By: Montey Hora on 02/12/2017 08:12:58 [name] ([id number]) -------------------------------------------------------------------------------- Foot Assessment Details Patient Name: [name] Date of Service: 02/12/2017 8:00 AM Medical Record Patient Account Number: [id number] Number: Treating RN: Montey Hora Date of Birth/Sex: [birth year] (82 y.o. Male) Other Clinician: [place], Treating ROBSON, MICHAEL Juliann Olesky: Sheppard Coil Chelli Yerkes/Extender: Kristine Royal, Referring Sophia Cubero: Marlena Clipper in Treatment: 0 Foot Assessment Items Site Locations + = Sensation present, - = Sensation absent, C = Callus, U = Ulcer R = Redness, W = Warmth, M = Maceration, PU = Pre-ulcerative lesion F = Fissure, S = Swelling, D = Dryness Assessment Right: Left: Other  Deformity: No No Prior Foot Ulcer: No No Prior Amputation: No No Charcot Joint: No No Ambulatory Status: Ambulatory Without Help Gait: Steady Electronic Signature(s) Signed: 02/13/2017 4:38:09 PM By: Montey Hora Entered By: Montey Hora on 02/12/2017 08:23:38 ERYX, Ryan ([id number]) [name] ([id number]) -------------------------------------------------------------------------------- Nutrition Risk Assessment Details Patient Name: [name] Date of Service: 02/12/2017 8:00 AM Medical Record Patient Account Number: [id number] Number: Treating RN: Montey Hora Date of Birth/Sex: [birth year] (82 y.o. Male) Other Clinician: [place], Treating ROBSON, MICHAEL Shjon Lizarraga: Sheppard Coil Lashunta Frieden/Extender: Kristine Royal, Referring Traniece Boffa: Marlena Clipper in Treatment: 0 Height (in): Weight (lbs): Body Mass Index (BMI): Nutrition Risk Assessment Items NUTRITION RISK SCREEN: I have an illness or condition that made me change the kind and/or 0 No amount of food I eat I eat fewer than two meals per day 0 No I eat few fruits and vegetables, or milk products 0 No I have three or more drinks of beer, liquor or wine almost every day 0 No I have tooth or mouth problems that make it hard for me to eat 0 No I don't always have enough money to buy the food I need 0 No I eat alone most of the time 0 No I take three or more different prescribed or over-the-counter drugs a 1 Yes day Without wanting to, I have lost or gained 10 pounds in the last six 0 No months I am not always physically able to shop, cook and/or feed myself 0 No Nutrition Protocols Good Risk Protocol 0 No interventions needed Moderate Risk Protocol Electronic Signature(s) Signed: 02/13/2017 4:38:09 PM By: Montey Hora Entered By: Montey Hora on 02/12/2017 08:13:51

## 2017-02-15 DIAGNOSIS — I10 Essential (primary) hypertension: Secondary | ICD-10-CM | POA: Diagnosis not present

## 2017-02-15 DIAGNOSIS — Z87891 Personal history of nicotine dependence: Secondary | ICD-10-CM | POA: Diagnosis not present

## 2017-02-15 DIAGNOSIS — E1142 Type 2 diabetes mellitus with diabetic polyneuropathy: Secondary | ICD-10-CM | POA: Diagnosis not present

## 2017-02-15 DIAGNOSIS — Z8546 Personal history of malignant neoplasm of prostate: Secondary | ICD-10-CM | POA: Diagnosis not present

## 2017-02-15 DIAGNOSIS — Z9181 History of falling: Secondary | ICD-10-CM | POA: Diagnosis not present

## 2017-02-15 DIAGNOSIS — S0100XA Unspecified open wound of scalp, initial encounter: Secondary | ICD-10-CM | POA: Diagnosis not present

## 2017-02-17 DIAGNOSIS — Z87891 Personal history of nicotine dependence: Secondary | ICD-10-CM | POA: Diagnosis not present

## 2017-02-17 DIAGNOSIS — S0100XA Unspecified open wound of scalp, initial encounter: Secondary | ICD-10-CM | POA: Diagnosis not present

## 2017-02-17 DIAGNOSIS — E1142 Type 2 diabetes mellitus with diabetic polyneuropathy: Secondary | ICD-10-CM | POA: Diagnosis not present

## 2017-02-17 DIAGNOSIS — Z9181 History of falling: Secondary | ICD-10-CM | POA: Diagnosis not present

## 2017-02-17 DIAGNOSIS — Z8546 Personal history of malignant neoplasm of prostate: Secondary | ICD-10-CM | POA: Diagnosis not present

## 2017-02-17 DIAGNOSIS — I10 Essential (primary) hypertension: Secondary | ICD-10-CM | POA: Diagnosis not present

## 2017-02-18 ENCOUNTER — Encounter: Payer: Medicare Other | Admitting: Internal Medicine

## 2017-02-18 DIAGNOSIS — I10 Essential (primary) hypertension: Secondary | ICD-10-CM | POA: Diagnosis not present

## 2017-02-18 DIAGNOSIS — E1140 Type 2 diabetes mellitus with diabetic neuropathy, unspecified: Secondary | ICD-10-CM | POA: Diagnosis not present

## 2017-02-18 DIAGNOSIS — S0101XD Laceration without foreign body of scalp, subsequent encounter: Secondary | ICD-10-CM | POA: Diagnosis not present

## 2017-02-18 DIAGNOSIS — Z7984 Long term (current) use of oral hypoglycemic drugs: Secondary | ICD-10-CM | POA: Diagnosis not present

## 2017-02-18 DIAGNOSIS — S0101XA Laceration without foreign body of scalp, initial encounter: Secondary | ICD-10-CM | POA: Diagnosis not present

## 2017-02-18 DIAGNOSIS — Z8546 Personal history of malignant neoplasm of prostate: Secondary | ICD-10-CM | POA: Diagnosis not present

## 2017-02-18 DIAGNOSIS — Z87891 Personal history of nicotine dependence: Secondary | ICD-10-CM | POA: Diagnosis not present

## 2017-02-19 DIAGNOSIS — Z87891 Personal history of nicotine dependence: Secondary | ICD-10-CM | POA: Diagnosis not present

## 2017-02-19 DIAGNOSIS — Z9181 History of falling: Secondary | ICD-10-CM | POA: Diagnosis not present

## 2017-02-19 DIAGNOSIS — S0100XA Unspecified open wound of scalp, initial encounter: Secondary | ICD-10-CM | POA: Diagnosis not present

## 2017-02-19 DIAGNOSIS — Z8546 Personal history of malignant neoplasm of prostate: Secondary | ICD-10-CM | POA: Diagnosis not present

## 2017-02-19 DIAGNOSIS — I10 Essential (primary) hypertension: Secondary | ICD-10-CM | POA: Diagnosis not present

## 2017-02-19 DIAGNOSIS — E1142 Type 2 diabetes mellitus with diabetic polyneuropathy: Secondary | ICD-10-CM | POA: Diagnosis not present

## 2017-02-19 NOTE — Progress Notes (Signed)
AENEAS, LONGSWORTH ([id number]) Visit Report for 02/18/2017 Debridement Details Patient Name: [name], [name] Date of Service: 02/18/2017 2:30 PM Medical Record Patient Account Number: [id number] Number: Treating RN: Montey Hora Date of Birth/Sex: [birth year] (82 y.o. Male) Other Clinician: [place], Treating ROBSON, MICHAEL Provider: Sheppard Coil Provider/Extender: Kristine Royal, Referring Provider: Marlena Clipper in Treatment: 0 Debridement Performed for Wound #1 Head - Parietal Assessment: Performed By: Physician Ricard Dillon, MD Debridement: Debridement Pre-procedure Verification/Time Out Yes - 14:43 Taken: Start Time: 14:43 Pain Control: Lidocaine 4% Topical Solution Level: Skin/Subcutaneous Tissue Total Area Debrided (L x 1.1 (cm) x 1.5 (cm) = 1.65 (cm) W): Tissue and other Viable, Non-Viable, Exudate, Fibrin/Slough, Subcutaneous material debrided: Instrument: Curette Bleeding: Minimum Hemostasis Achieved: Pressure End Time: 14:45 Procedural Pain: 0 Post Procedural Pain: 0 Response to Treatment: Procedure was tolerated well Post Debridement Measurements of Total Wound Length: (cm) 1.1 Width: (cm) 1.5 Depth: (cm) 0.2 Volume: (cm) 0.259 Character of Wound/Ulcer Post Improved Debridement: Post Procedure Diagnosis Same as Pre-procedure [name], [name] ([id number]) Electronic Signature(s) Signed: 02/18/2017 3:49:32 PM By: Montey Hora Signed: 02/18/2017 4:14:11 PM By: Linton Ham MD Entered By: Linton Ham on 02/18/2017 14:57:12 [place], [name] ([id number]) -------------------------------------------------------------------------------- HPI Details Patient Name: [name] Date of Service: 02/18/2017 2:30 PM Medical Record Patient Account Number: [id number] Number: Treating RN: Montey Hora Date of Birth/Sex: [birth year] (83 y.o. Male) Other Clinician: [place], Treating  ROBSON, MICHAEL Provider: Sheppard Coil Provider/Extender: Kristine Royal, Referring Provider: Marlena Clipper in Treatment: 0 History of Present Illness HPI Description: 02/12/17; this is an 83 year old man who lives in an independent retirement home "[facility]" he apparently had a syncopal spell while working outside in the heat on 12/13/16 ultimately he suffered a laceration the scalp. I don't see that this was actually sutured. He saw his primary physician on 12/20/16 noted to have a superficial abrasion at 2-3 cm and he was recommended topical antibiotic cream. He saw his primary doctor again on 02/10/17 as the laceration was not healing and was prescribed Bactroban cream which she has been applying for the last 2 days. I think he is having some difficulty given the location of this wound and the fact that it is hair is macerated over the wound. His syncopal spell was felt to be secondary to dehydration. 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 Last hemoglobin is A1c was 7.1 which for an 83 year old is really quite reasonable control. 02/18/17; this is a patient readmitted to the clinic last week with a significant laceration over the occiput of the scalp. We've been using Hydrofera Blue on this and the dimensions of come down nicely. He has home health changing the dressing Electronic Signature(s) Signed: 02/18/2017 4:14:11 PM By: Linton Ham MD Entered By: Linton Ham on 02/18/2017 14:58:02 Schwoerer, [name] ([id number]) -------------------------------------------------------------------------------- Physical Exam Details Patient Name: [name] Date of Service: 02/18/2017 2:30 PM Medical Record Patient Account Number: [id number] Number: Treating RN: Montey Hora Date of Birth/Sex: 1933/09/06 (83 y.o. Male) Other Clinician: [place], Treating ROBSON,  MICHAEL Provider: Sheppard Coil Provider/Extender: Kristine Royal, Referring Provider: Marlena Clipper in Treatment: 0 Constitutional Sitting or standing Blood Pressure is within target range for patient.. Pulse regular and within target range for patient.Marland Kitchen Respirations regular, non-labored and within target range.. Temperature is normal and within the target range for the patient.Marland Kitchen appears in no distress. Notes Wound exam; wound is definitely  smaller. Using a #3 curet I remove necrotic surface debris from the circumference of the wound. Hemostasis with direct pressure The hypercoagulation from last week is no longer evident. There is no surrounding infection. Electronic Signature(s) Signed: 02/18/2017 4:14:11 PM By: Linton Ham MD Entered By: Linton Ham on 02/18/2017 14:59:10 [name], [name] ([id number]) -------------------------------------------------------------------------------- Physician Orders Details Patient Name: [name] Date of Service: 02/18/2017 2:30 PM Medical Record Patient Account Number: [id number] Number: Treating RN: Montey Hora Date of Birth/Sex: 1934/01/01 (83 y.o. Male) Other Clinician: [place], Treating ROBSON, MICHAEL Provider: Sheppard Coil Provider/Extender: Kristine Royal, Referring Provider: Marlena Clipper in Treatment: 0 Verbal / Phone Orders: No Diagnosis Coding Wound Cleansing Wound #1 Head - Parietal o Clean wound with Normal Saline. o May Shower, gently pat wound dry prior to applying new dressing. o Other: - May wash hair with gentle shampoo Anesthetic Wound #1 Head - Parietal o Topical Lidocaine 4% cream applied to wound bed prior to debridement Primary Wound Dressing Wound #1 Head - Parietal o Hydrafera Blue Secondary Dressing Wound #1 Head - Parietal o Boardered Foam Dressing - or telfa island dressing Dressing Change Frequency Wound #1 Head - Parietal o Change Dressing  Monday, Wednesday, Friday Follow-up Appointments Wound #1 Head - Parietal o Return Appointment in 1 week. Additional Orders / Instructions Wound #1 Head - Parietal o Increase protein intake. o Other: - Please add vitamin A, vitamin C and zinc supplements to your diet [place] ([id number]) Wound #1 Head - Parietal o [place] for [place] Nurse may visit PRN to address patientos wound care needs. o FACE TO FACE ENCOUNTER: MEDICARE and MEDICAID PATIENTS: I certify that this patient is under my care and that I had a face-to-face encounter that meets the physician face-to-face encounter requirements with this patient on this date. The encounter with the patient was in whole or in part for the following MEDICAL CONDITION: (primary reason for [place]) MEDICAL NECESSITY: I certify, that based on my findings, NURSING services are a medically necessary home health service. HOME BOUND STATUS: I certify that my clinical findings support that this patient is homebound (i.e., Due to illness or injury, pt requires aid of supportive devices such as crutches, cane, wheelchairs, walkers, the use of special transportation or the assistance of another person to leave their place of residence. There is a normal inability to leave the home and doing so requires considerable and taxing effort. Other absences are for medical reasons / religious services and are infrequent or of short duration when for other reasons). o If current dressing causes regression in wound condition, may D/C ordered dressing product/s and apply Normal Saline Moist Dressing daily until next [place] / Other MD appointment. [place] of regression in wound condition at [phone]. o Please direct any NON-WOUND related issues/requests for orders to patient's Primary Care Physician Electronic Signature(s) Signed: 02/18/2017 3:49:32  PM By: Montey Hora Signed: 02/18/2017 4:14:11 PM By: Linton Ham MD Entered By: Montey Hora on 02/18/2017 14:45:40 [name], [name] ([id number]) -------------------------------------------------------------------------------- Problem List Details Patient Name: [name] Date of Service: 02/18/2017 2:30 PM Medical Record Patient Account Number: [id number] Number: Treating RN: Montey Hora Date of Birth/Sex: [birth year] (83 y.o. Male) Other Clinician: [place], Treating ROBSON, MICHAEL Provider: Sheppard Coil Provider/Extender: Kristine Royal, Referring Provider: Marlena Clipper in Treatment: 0 Active Problems ICD-10 Encounter Code Description Active Date Diagnosis S01.01XD Laceration without foreign body of scalp,  subsequent 02/12/2017 Yes encounter E11.40 Type 2 diabetes mellitus with diabetic neuropathy, 02/12/2017 Yes unspecified Inactive Problems Resolved Problems Electronic Signature(s) Signed: 02/18/2017 4:14:11 PM By: Linton Ham MD Entered By: Linton Ham on 02/18/2017 14:56:44 [place], [name] ([id number]) -------------------------------------------------------------------------------- Progress Note Details Patient Name: [name] Date of Service: 02/18/2017 2:30 PM Medical Record Patient Account Number: [id number] Number: Treating RN: Montey Hora Date of Birth/Sex: [birth year] (83 y.o. Male) Other Clinician: [place], Treating ROBSON, MICHAEL Provider: Sheppard Coil Provider/Extender: Kristine Royal, Referring Provider: Marlena Clipper in Treatment: 0 Subjective History of Present Illness (HPI) 02/12/17; this is an 83 year old man who lives in an independent retirement home "[facility] he apparently had a syncopal spell while working outside in the heat on 12/13/16 ultimately he suffered a laceration the scalp. I don't see that this was actually sutured. He saw his primary physician  on 12/20/16 noted to have a superficial abrasion at 2-3 cm and he was recommended topical antibiotic cream. He saw his primary doctor again on 02/10/17 as the laceration was not healing and was prescribed Bactroban cream which she has been applying for the last 2 days. I think he is having some difficulty given the location of this wound and the fact that it is hair is macerated over the wound. His syncopal spell was felt to be secondary to dehydration. 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 Last hemoglobin is A1c was 7.1 which for an 83 year old is really quite reasonable control. 02/18/17; this is a patient readmitted to the clinic last week with a significant laceration over the occiput of the scalp. We've been using Hydrofera Blue on this and the dimensions of come down nicely. He has home health changing the dressing Objective Constitutional Sitting or standing Blood Pressure is within target range for patient.. Pulse regular and within target range for patient.Marland Kitchen Respirations regular, non-labored and within target range.. Temperature is normal and within the target range for the patient.Marland Kitchen appears in no distress. Vitals Time Taken: 2:29 PM, Height: 69 in, Weight: 172 lbs, BMI: 25.4, Temperature: 98.4 F, Pulse: 63 bpm, Respiratory Rate: 18 breaths/min, Blood Pressure: 135/62 mmHg. [name], Bryan F. ([id number]) General Notes: Wound exam; wound is definitely smaller. Using a #3 curet I remove necrotic surface debris from the circumference of the wound. Hemostasis with direct pressure The hypercoagulation from last week is no longer evident. There is no surrounding infection. Integumentary (Hair, Skin) Wound #1 status is Open. Original cause of wound was Trauma. The wound is located on the Head - Parietal. The wound measures 1.1cm length x 1.5cm width x 0.1cm depth; 1.296cm^2 area and 0.13cm^3 volume. There  is Fat Layer (Subcutaneous Tissue) Exposed exposed. There is no tunneling or undermining noted. There is a large amount of serous drainage noted. The wound margin is flat and intact. There is large (67-100%) red, hyper - granulation within the wound bed. There is no necrotic tissue within the wound bed. The periwound skin appearance did not exhibit: Callus, Crepitus, Excoriation, Induration, Rash, Scarring, Dry/Scaly, Maceration, Atrophie Blanche, Cyanosis, Ecchymosis, Hemosiderin Staining, Mottled, Pallor, Rubor, Erythema. Periwound temperature was noted as No Abnormality. The periwound has tenderness on palpation. Assessment Active Problems ICD-10 S01.01XD - Laceration without foreign body of scalp, subsequent encounter E11.40 - Type 2 diabetes mellitus with diabetic neuropathy, unspecified Procedures Wound #1 Pre-procedure diagnosis of Wound #1 is a Trauma, Other located on the Head - Parietal . There was  a Skin/Subcutaneous Tissue Debridement ([id number]) debridement with total area of 1.65 sq cm performed by Ricard Dillon, MD. with the following instrument(s): Curette to remove Viable and Non-Viable tissue/material including Exudate, Fibrin/Slough, and Subcutaneous after achieving pain control using Lidocaine 4% Topical Solution. A time out was conducted at 14:43, prior to the start of the procedure. A Minimum amount of bleeding was controlled with Pressure. The procedure was tolerated well with a pain level of 0 throughout and a pain level of 0 following the procedure. Post Debridement Measurements: 1.1cm length x 1.5cm width x 0.2cm depth; 0.259cm^3 volume. Character of Wound/Ulcer Post Debridement is improved. Post procedure Diagnosis Wound #1: Same as Pre-Procedure [name], Bryan F. ([id number]) Plan Wound Cleansing: Wound #1 Head - Parietal: Clean wound with Normal Saline. May Shower, gently pat wound dry prior to applying new dressing. Other: - May wash hair with gentle  shampoo Anesthetic: Wound #1 Head - Parietal: Topical Lidocaine 4% cream applied to wound bed prior to debridement Primary Wound Dressing: Wound #1 Head - Parietal: Hydrafera Blue Secondary Dressing: Wound #1 Head - Parietal: Boardered Foam Dressing - or telfa island dressing Dressing Change Frequency: Wound #1 Head - Parietal: Change Dressing Monday, Wednesday, Friday Follow-up Appointments: Wound #1 Head - Parietal: Return Appointment in 1 week. Additional Orders / Instructions: Wound #1 Head - Parietal: Increase protein intake. Other: - Please add vitamin A, vitamin C and zinc supplements to your diet Home Health: Wound #1 Head - Parietal: [place] for [place] Nurse may visit PRN to address patient s wound care needs. FACE TO FACE ENCOUNTER: MEDICARE and MEDICAID PATIENTS: I certify that this patient is under my care and that I had a face-to-face encounter that meets the physician face-to-face encounter requirements with this patient on this date. The encounter with the patient was in whole or in part for the following MEDICAL CONDITION: (primary reason for [place]) MEDICAL NECESSITY: I certify, that based on my findings, NURSING services are a medically necessary home health service. HOME BOUND STATUS: I certify that my clinical findings support that this patient is homebound (i.e., Due to illness or injury, pt requires aid of supportive devices such as crutches, cane, wheelchairs, walkers, the use of special transportation or the assistance of another person to leave their place of residence. There is a normal inability to leave the home and doing so requires considerable and taxing effort. Other absences are for medical reasons / religious services and are infrequent or of short duration when for other reasons). If current dressing causes regression in wound condition, may D/C ordered dressing product/s and apply Normal Saline Moist  Dressing daily until next [place] / Other MD appointment. [place] of regression in wound condition at [phone]. Please direct any NON-WOUND related issues/requests for orders to patient's Primary Care Physician [name], [name]. ([id number]) #1 traumatic wound to the occipital scalp. This is doing quite nicely. No reason to change the primary dressing which is Mudlogger) Signed: 02/18/2017 4:14:11 PM By: Linton Ham MD Entered By: Linton Ham on 02/18/2017 14:59:48 [name], [place] ([id number]) -------------------------------------------------------------------------------- SuperBill Details Patient Name: [name] Date of Service: 02/18/2017 Medical Record Patient Account Number: [id number] Number: Treating RN: Montey Hora Date of Birth/Sex: 11-24-1933 (83 y.o. Male) Other Clinician: [place], Treating ROBSON, MICHAEL Provider: Sheppard Coil Provider/Extender: Kristine Royal, Weeks in Treatment: 0 Referring Provider: Sheppard Coil Diagnosis Coding ICD-10 Codes Code Description S01.01XD Laceration without foreign body of  scalp, subsequent encounter E11.40 Type 2 diabetes mellitus with diabetic neuropathy, unspecified Facility Procedures CPT4 Code Description: [id number] 11042 - DEB SUBQ TISSUE 20 SQ CM/< ICD-10 Description Diagnosis S01.01XD Laceration without foreign body of scalp, subsequ Modifier: ent encounte Quantity: 1 r Physician Procedures CPT4 Code Description: [id number] 11042 - WC PHYS SUBQ TISS 20 SQ CM ICD-10 Description Diagnosis S01.01XD Laceration without foreign body of scalp, subsequ Modifier: ent encounte Quantity: 1 r Electronic Signature(s) Signed: 02/18/2017 4:14:11 PM By: Linton Ham MD Entered By: Linton Ham on 02/18/2017 15:00:09

## 2017-02-19 NOTE — Progress Notes (Signed)
ZAYNE, DRAHEIM ([id number]) Visit Report for 02/18/2017 Arrival Information Details Patient Name: [name], [name] Date of Service: 02/18/2017 2:30 PM Medical Record Patient Account Number: [id number] Number: Treating RN: Montey Hora Date of Birth/Sex: 10/22/1933 (82 y.o. Male) Other Clinician: Scherry Ran Primary Care Ramani Riva: Sheppard Coil Arlenne Kimbley/Extender: Kristine Royal, Referring Dewain Platz: Marlena Clipper in Treatment: 0 Visit Information History Since Last Visit Added or deleted any medications: No Patient Arrived: Ambulatory Any new allergies or adverse reactions: No Arrival Time: 14:28 Had a fall or experienced change in No Accompanied By: self activities of daily living that may affect Transfer Assistance: None risk of falls: Patient Identification Verified: Yes Signs or symptoms of abuse/neglect since last No Secondary Verification Process Yes visito Completed: Hospitalized since last visit: No Patient Has Alerts: Yes Has Dressing in Place as Prescribed: Yes Patient Alerts: DM II Pain Present Now: No Electronic Signature(s) Signed: 02/18/2017 3:49:32 PM By: Montey Hora Entered By: Montey Hora on 02/18/2017 14:28:42 Risko, [name] ([id number]) -------------------------------------------------------------------------------- Encounter Discharge Information Details Patient Name: [name] Date of Service: 02/18/2017 2:30 PM Medical Record Patient Account Number: [id number] Number: Treating RN: Montey Hora Date of Birth/Sex: 30-Jun-1933 (83 y.o. Male) Other Clinician: Scherry Ran Primary Care Khole Arterburn: Sheppard Coil Mahitha Hickling/Extender: Kristine Royal, Referring Celestine Bougie: Marlena Clipper in Treatment: 0 Encounter Discharge Information Items Discharge Pain Level: 0 Discharge Condition: Stable Ambulatory Status: Ambulatory Discharge Destination: Home Transportation:  Private Auto Accompanied By: self Schedule Follow-up Appointment: Yes Medication Reconciliation completed and provided to Patient/Care No Sander Remedios: Provided on Clinical Summary of Care: 02/18/2017 Form Type Recipient Paper Patient JC Electronic Signature(s) Signed: 02/18/2017 3:49:32 PM By: Montey Hora Entered By: Montey Hora on 02/18/2017 15:15:43 Kratzke, [name] ([id number]) -------------------------------------------------------------------------------- Multi Wound Chart Details Patient Name: [name] Date of Service: 02/18/2017 2:30 PM Medical Record Patient Account Number: [id number] Number: Treating RN: Montey Hora Date of Birth/Sex: July 28, 1933 (83 y.o. Male) Other Clinician: Scherry Ran Primary Care Laureano Hetzer: Sheppard Coil Kweku Stankey/Extender: Kristine Royal, Referring Tonya Wantz: Marlena Clipper in Treatment: 0 Vital Signs Height(in): 69 Pulse(bpm): 63 Weight(lbs): 172 Blood Pressure 135/62 (mmHg): Body Mass Index(BMI): 25 Temperature(F): 98.4 Respiratory Rate 18 (breaths/min): Photos: [1:No Photos] [N/A:N/A] Wound Location: [1:Head - Parietal] [N/A:N/A] Wounding Event: [1:Trauma] [N/A:N/A] Primary Etiology: [1:Trauma, Other] [N/A:N/A] Comorbid History: [1:Hypertension, Type II Diabetes, Neuropathy] [N/A:N/A] Date Acquired: [1:12/13/2016] [N/A:N/A] Weeks of Treatment: [1:0] [N/A:N/A] Wound Status: [1:Open] [N/A:N/A] Measurements L x W x D 1.1x1.5x0.1 [N/A:N/A] (cm) Area (cm) : [1:1.296] [N/A:N/A] Volume (cm) : [1:0.13] [N/A:N/A] % Reduction in Area: [1:25.30%] [N/A:N/A] % Reduction in Volume: 25.30% [N/A:N/A] Classification: [1:Full Thickness Without Exposed Support Structures] [N/A:N/A] Exudate Amount: [1:Large] [N/A:N/A] Exudate Type: [1:Serous] [N/A:N/A] Exudate Color: [1:amber] [N/A:N/A] Wound Margin: [1:Flat and Intact] [N/A:N/A] Granulation Amount: [1:Large (67-100%)] [N/A:N/A] Granulation  Quality: [1:Red, Hyper-granulation] [N/A:N/A] Necrotic Amount: [1:None Present (0%)] [N/A:N/A] Exposed Structures: [1:Fat Layer (Subcutaneous Tissue) Exposed: Yes] [N/A:N/A] Fascia: No Tendon: No Muscle: No Joint: No Bone: No Epithelialization: None N/A N/A Debridement: Debridement (58850- N/A N/A 11047) Pre-procedure 14:43 N/A N/A Verification/Time Out Taken: Pain Control: Lidocaine 4% Topical N/A N/A Solution Tissue Debrided: Fibrin/Slough, Exudates, N/A N/A Subcutaneous Level: Skin/Subcutaneous N/A N/A Tissue Debridement Area (sq 1.65 N/A N/A cm): Instrument: Curette N/A N/A Bleeding: Minimum N/A N/A Hemostasis Achieved: Pressure N/A N/A Procedural Pain: 0 N/A N/A Post Procedural Pain: 0 N/A N/A Debridement Treatment Procedure was tolerated N/A N/A Response: well Post Debridement 1.1x1.5x0.2 N/A N/A Measurements L x W x D (cm) Post Debridement 0.259 N/A  N/A Volume: (cm) Periwound Skin Texture: Excoriation: No N/A N/A Induration: No Callus: No Crepitus: No Rash: No Scarring: No Periwound Skin Maceration: No N/A N/A Moisture: Dry/Scaly: No Periwound Skin Color: Atrophie Blanche: No N/A N/A Cyanosis: No Ecchymosis: No Erythema: No Hemosiderin Staining: No Mottled: No Pallor: No Rubor: No Temperature: No Abnormality N/A N/A Tenderness on Yes N/A N/A Palpation: EMERY, DUPUY F. ([id number]) Wound Preparation: Ulcer Cleansing: N/A N/A Rinsed/Irrigated with Saline Topical Anesthetic Applied: Other: lidocaine 4% Procedures Performed: Debridement N/A N/A Treatment Notes Electronic Signature(s) Signed: 02/18/2017 4:14:11 PM By: Linton Ham MD Entered By: Linton Ham on 02/18/2017 14:56:59 [place], [name] ([id number]) -------------------------------------------------------------------------------- Multi-Disciplinary Care Plan Details Patient Name: [name] Date of Service: 02/18/2017 2:30 PM Medical Record Patient Account Number:  [id number] Number: Treating RN: Montey Hora Date of Birth/Sex: 08-Aug-1933 (83 y.o. Male) Other Clinician: Scherry Ran Primary Care Breyton Vanscyoc: Sheppard Coil Axl Rodino/Extender: Kristine Royal, Referring Tinika Bucknam: Marlena Clipper in Treatment: 0 Active Inactive ` Abuse / Safety / Falls / Self Care Management Nursing Diagnoses: Potential for falls Goals: Patient will remain injury free related to falls Date Initiated: 02/12/2017 Target Resolution Date: 05/02/2017 Goal Status: Active Interventions: Assess fall risk on admission and as needed Notes: ` Orientation to the Wound Care Program Nursing Diagnoses: Knowledge deficit related to the wound healing center program Goals: Patient/caregiver will verbalize understanding of the [facility] Date Initiated: 02/12/2017 Target Resolution Date: 05/02/2017 Goal Status: Active Interventions: Provide education on orientation to the wound center Notes: ` Wound/Skin Impairment [name], [name]. ([id number]) Nursing Diagnoses: Impaired tissue integrity Goals: Ulcer/skin breakdown will heal within 14 weeks Date Initiated: 02/12/2017 Target Resolution Date: 05/02/2017 Goal Status: Active Interventions: Assess patient/caregiver ability to obtain necessary supplies Assess patient/caregiver ability to perform ulcer/skin care regimen upon admission and as needed Assess ulceration(s) every visit Notes: Electronic Signature(s) Signed: 02/18/2017 3:49:32 PM By: Montey Hora Entered By: Montey Hora on 02/18/2017 14:43:00 [place], [name] ([id number]) -------------------------------------------------------------------------------- Pain Assessment Details Patient Name: [name] Date of Service: 02/18/2017 2:30 PM Medical Record Patient Account Number: [id number] Number: Treating RN: Montey Hora Date of Birth/Sex: [birth year] (82 y.o. Male) Other  Clinician: Scherry Ran Primary Care Mollie Rossano: Sheppard Coil Shai Rasmussen/Extender: Kristine Royal, Referring Cory Rama: Marlena Clipper in Treatment: 0 Active Problems Location of Pain Severity and Description of Pain Patient Has Paino No Site Locations Pain Management and Medication Current Pain Management: Notes Topical or injectable lidocaine is offered to patient for acute pain when surgical debridement is performed. If needed, Patient is instructed to use over the counter pain medication for the following 24-48 hours after debridement. Wound care MDs do not prescribed pain medications. Patient has chronic pain or uncontrolled pain. Patient has been instructed to make an appointment with their Primary Care Physician for pain management. Electronic Signature(s) Signed: 02/18/2017 3:49:32 PM By: Montey Hora Entered By: Montey Hora on 02/18/2017 14:28:56 Carfagno, [name] ([id number]) -------------------------------------------------------------------------------- Patient/Caregiver Education Details Patient Name: [name] Date of Service: 02/18/2017 2:30 PM Medical Record Patient Account Number: [id number] Number: Treating RN: Montey Hora Date of Birth/Gender: 06-16-1933 (83 y.o. Male) Other Clinician: Ramsey, Treating ROBSON, Conrad Physician: Sheppard Coil Physician/Extender: Kristine Royal, Weeks in Treatment: 0 Referring Physician: Sheppard Coil Education Assessment Education Provided To: Patient Education Topics Provided Wound/Skin Impairment: Handouts: Other: wound care as ordered Methods: Demonstration, Explain/Verbal Responses: State content correctly Electronic Signature(s) Signed: 02/18/2017 3:49:32 PM By: Montey Hora Entered By: Montey Hora on 02/18/2017 15:16:02 [name], Bryan F. ([id number]) --------------------------------------------------------------------------------  Wound Assessment  Details Patient Name: [name], [name] Date of Service: 02/18/2017 2:30 PM Medical Record Patient Account Number: [id number] Number: Treating RN: Montey Hora Date of Birth/Sex: 08-Jul-1933 (82 y.o. Male) Other Clinician: Jetty Duhamel MICHAEL Primary Care Janaisa Birkland: Sheppard Coil [place] Whedbee/Extender: Kristine Royal, Referring Janeka Libman: Marlena Clipper in Treatment: 0 Wound Status Wound Number: 1 Primary Trauma, Other Etiology: Wound Location: Head - Parietal Wound Status: Open Wounding Event: Trauma Comorbid Hypertension, Type II Diabetes, Date Acquired: 12/13/2016 History: Neuropathy Weeks Of Treatment: 0 Clustered Wound: No Photos Photo Uploaded By: Montey Hora on 02/18/2017 15:43:29 Wound Measurements Length: (cm) 1.1 Width: (cm) 1.5 Depth: (cm) 0.1 Area: (cm) 1.296 Volume: (cm) 0.13 % Reduction in Area: 25.3% % Reduction in Volume: 25.3% Epithelialization: None Tunneling: No Undermining: No Wound Description Full Thickness Without Exposed Classification: Support Structures Wound Margin: Flat and Intact Exudate Large Amount: Exudate Type: Serous Exudate Color: amber Foul Odor After Cleansing: No Slough/Fibrino No Wound Bed [place], Bryan F. ([id number]) Granulation Amount: Large (67-100%) Exposed Structure Granulation Quality: Red, Hyper-granulation Fascia Exposed: No Necrotic Amount: None Present (0%) Fat Layer (Subcutaneous Tissue) Exposed: Yes Tendon Exposed: No Muscle Exposed: No Joint Exposed: No Bone Exposed: No Periwound Skin Texture Texture Color No Abnormalities Noted: No No Abnormalities Noted: No Callus: No Atrophie Blanche: No Crepitus: No Cyanosis: No Excoriation: No Ecchymosis: No Induration: No Erythema: No Rash: No Hemosiderin Staining: No Scarring: No Mottled: No Pallor: No Moisture Rubor: No No Abnormalities Noted: No Dry / Scaly: No Temperature / Pain Maceration: No Temperature: No  Abnormality Tenderness on Palpation: Yes Wound Preparation Ulcer Cleansing: Rinsed/Irrigated with Saline Topical Anesthetic Applied: Other: lidocaine 4%, Treatment Notes Wound #1 (Head - Parietal) 1. Cleansed with: Clean wound with Normal Saline 2. Anesthetic Topical Lidocaine 4% cream to wound bed prior to debridement 4. Dressing Applied: Hydrafera Blue 5. Secondary [place] Signature(s) Signed: 02/18/2017 3:49:32 PM By: Montey Hora Entered By: Montey Hora on 02/18/2017 14:38:58 [name], [name] ([id number]) -------------------------------------------------------------------------------- Vitals Details Patient Name: [name] Date of Service: 02/18/2017 2:30 PM Medical Record Patient Account Number: [id number] Number: Treating RN: Montey Hora Date of Birth/Sex: 1934-02-08 (83 y.o. Male) Other Clinician: Jetty Duhamel MICHAEL Primary Care Kairi Tufo: Sheppard Coil Gina Costilla/Extender: Kristine Royal, Referring Zawadi Aplin: Marlena Clipper in Treatment: 0 Vital Signs Time Taken: 14:29 Temperature (F): 98.4 Height (in): 69 Pulse (bpm): 63 Weight (lbs): 172 Respiratory Rate (breaths/min): 18 Body Mass Index (BMI): 25.4 Blood Pressure (mmHg): 135/62 Reference Range: 80 - 120 mg / dl Electronic Signature(s) Signed: 02/18/2017 3:49:32 PM By: Montey Hora Entered By: Montey Hora on 02/18/2017 14:31:14

## 2017-02-21 DIAGNOSIS — I10 Essential (primary) hypertension: Secondary | ICD-10-CM | POA: Diagnosis not present

## 2017-02-21 DIAGNOSIS — E1142 Type 2 diabetes mellitus with diabetic polyneuropathy: Secondary | ICD-10-CM | POA: Diagnosis not present

## 2017-02-21 DIAGNOSIS — S0100XA Unspecified open wound of scalp, initial encounter: Secondary | ICD-10-CM | POA: Diagnosis not present

## 2017-02-21 DIAGNOSIS — Z87891 Personal history of nicotine dependence: Secondary | ICD-10-CM | POA: Diagnosis not present

## 2017-02-21 DIAGNOSIS — Z8546 Personal history of malignant neoplasm of prostate: Secondary | ICD-10-CM | POA: Diagnosis not present

## 2017-02-21 DIAGNOSIS — Z9181 History of falling: Secondary | ICD-10-CM | POA: Diagnosis not present

## 2017-02-24 DIAGNOSIS — Z8546 Personal history of malignant neoplasm of prostate: Secondary | ICD-10-CM | POA: Diagnosis not present

## 2017-02-24 DIAGNOSIS — E1142 Type 2 diabetes mellitus with diabetic polyneuropathy: Secondary | ICD-10-CM | POA: Diagnosis not present

## 2017-02-24 DIAGNOSIS — Z9181 History of falling: Secondary | ICD-10-CM | POA: Diagnosis not present

## 2017-02-24 DIAGNOSIS — I10 Essential (primary) hypertension: Secondary | ICD-10-CM | POA: Diagnosis not present

## 2017-02-24 DIAGNOSIS — Z87891 Personal history of nicotine dependence: Secondary | ICD-10-CM | POA: Diagnosis not present

## 2017-02-24 DIAGNOSIS — S0100XA Unspecified open wound of scalp, initial encounter: Secondary | ICD-10-CM | POA: Diagnosis not present

## 2017-02-26 ENCOUNTER — Encounter: Payer: Medicare Other | Attending: Internal Medicine | Admitting: Internal Medicine

## 2017-02-26 DIAGNOSIS — Z7984 Long term (current) use of oral hypoglycemic drugs: Secondary | ICD-10-CM | POA: Diagnosis not present

## 2017-02-26 DIAGNOSIS — W19XXXD Unspecified fall, subsequent encounter: Secondary | ICD-10-CM | POA: Diagnosis not present

## 2017-02-26 DIAGNOSIS — E1140 Type 2 diabetes mellitus with diabetic neuropathy, unspecified: Secondary | ICD-10-CM | POA: Diagnosis not present

## 2017-02-26 DIAGNOSIS — Z8546 Personal history of malignant neoplasm of prostate: Secondary | ICD-10-CM | POA: Insufficient documentation

## 2017-02-26 DIAGNOSIS — E1142 Type 2 diabetes mellitus with diabetic polyneuropathy: Secondary | ICD-10-CM | POA: Diagnosis not present

## 2017-02-26 DIAGNOSIS — S0100XA Unspecified open wound of scalp, initial encounter: Secondary | ICD-10-CM | POA: Diagnosis not present

## 2017-02-26 DIAGNOSIS — Z87891 Personal history of nicotine dependence: Secondary | ICD-10-CM | POA: Diagnosis not present

## 2017-02-26 DIAGNOSIS — Z9181 History of falling: Secondary | ICD-10-CM | POA: Diagnosis not present

## 2017-02-26 DIAGNOSIS — I10 Essential (primary) hypertension: Secondary | ICD-10-CM | POA: Insufficient documentation

## 2017-02-26 DIAGNOSIS — S0101XD Laceration without foreign body of scalp, subsequent encounter: Secondary | ICD-10-CM | POA: Diagnosis not present

## 2017-02-26 DIAGNOSIS — S0101XA Laceration without foreign body of scalp, initial encounter: Secondary | ICD-10-CM | POA: Diagnosis not present

## 2017-02-27 ENCOUNTER — Other Ambulatory Visit: Payer: Self-pay | Admitting: Family Medicine

## 2017-02-27 DIAGNOSIS — E1142 Type 2 diabetes mellitus with diabetic polyneuropathy: Secondary | ICD-10-CM

## 2017-02-27 NOTE — Progress Notes (Signed)
ARVID, MARENGO ([id number]) Visit Report for 02/26/2017 Arrival Information Details Patient Name: LUX, MEADERS Date of Service: 02/26/2017 2:30 PM Medical Record Patient Account Number: [id number] Number: Treating RN: Montey Hora Date of Birth/Sex: Jul 24, 1933 (82 y.o. Male) Other Clinician: Scherry Ran Primary Care Loriene Taunton: Sheppard Coil Areona Homer/Extender: Kristine Royal, Referring Corvin Sorbo: Marlena Clipper in Treatment: 2 Visit Information History Since Last Visit Added or deleted any medications: No Patient Arrived: Ambulatory Any new allergies or adverse reactions: No Arrival Time: 14:35 Had a fall or experienced change in No Accompanied By: self activities of daily living that may affect Transfer Assistance: None risk of falls: Patient Identification Verified: Yes Signs or symptoms of abuse/neglect since last No Secondary Verification Process Yes visito Completed: Hospitalized since last visit: No Patient Has Alerts: Yes Has Dressing in Place as Prescribed: Yes Patient Alerts: DM II Pain Present Now: No Electronic Signature(s) Signed: 02/26/2017 4:26:30 PM By: Montey Hora Entered By: Montey Hora on 02/26/2017 14:35:47 Sagraves, [name] ([id number]) -------------------------------------------------------------------------------- Encounter Discharge Information Details Patient Name: Mare Loan Date of Service: 02/26/2017 2:30 PM Medical Record Patient Account Number: [id number] Number: Treating RN: Montey Hora Date of Birth/Sex: [birth year] (84 y.o. Male) Other Clinician: Scherry Ran Primary Care Viola Kinnick: Sheppard Coil Amay Mijangos/Extender: Kristine Royal, Referring Chanae Gemma: Marlena Clipper in Treatment: 2 Encounter Discharge Information Items Discharge Pain Level: 0 Discharge Condition: Stable Ambulatory Status: Ambulatory Discharge Destination: Home Transportation:  Private Auto Accompanied By: self Schedule Follow-up Appointment: Yes Medication Reconciliation completed and provided to Patient/Care No China Deitrick: Provided on Clinical Summary of Care: 02/26/2017 Form Type Recipient Paper Patient JC Electronic Signature(s) Signed: 02/26/2017 4:17:02 PM By: Ruthine Dose Entered By: Ruthine Dose on 02/26/2017 14:55:54 Beougher, [name] ([id number]) -------------------------------------------------------------------------------- Multi Wound Chart Details Patient Name: Mare Loan Date of Service: 02/26/2017 2:30 PM Medical Record Patient Account Number: [id number] Number: Treating RN: Montey Hora Date of Birth/Sex: [birth year] (83 y.o. Male) Other Clinician: Scherry Ran Primary Care Zyon Grout: Sheppard Coil Kalai Baca/Extender: Kristine Royal, Referring Deondra Wigger: Marlena Clipper in Treatment: 2 Vital Signs Height(in): 69 Pulse(bpm): 69 Weight(lbs): 172 Blood Pressure 132/61 (mmHg): Body Mass Index(BMI): 25 Temperature(F): 98.0 Respiratory Rate 18 (breaths/min): Photos: [1:No Photos] [N/A:N/A] Wound Location: [1:Head - Parietal] [N/A:N/A] Wounding Event: [1:Trauma] [N/A:N/A] Primary Etiology: [1:Trauma, Other] [N/A:N/A] Comorbid History: [1:Hypertension, Type II Diabetes, Neuropathy] [N/A:N/A] Date Acquired: [1:12/13/2016] [N/A:N/A] Weeks of Treatment: [1:2] [N/A:N/A] Wound Status: [1:Open] [N/A:N/A] Measurements L x W x D 0.5x0.9x0.1 [N/A:N/A] (cm) Area (cm) : [1:0.353] [N/A:N/A] Volume (cm) : [1:0.035] [N/A:N/A] % Reduction in Area: [1:79.70%] [N/A:N/A] % Reduction in Volume: 79.90% [N/A:N/A] Classification: [1:Full Thickness Without Exposed Support Structures] [N/A:N/A] Exudate Amount: [1:Large] [N/A:N/A] Exudate Type: [1:Serous] [N/A:N/A] Exudate Color: [1:amber] [N/A:N/A] Wound Margin: [1:Flat and Intact] [N/A:N/A] Granulation Amount: [1:Large (67-100%)] [N/A:N/A] Granulation  Quality: [1:Red, Hyper-granulation] [N/A:N/A] Necrotic Amount: [1:Small (1-33%)] [N/A:N/A] Necrotic Tissue: [1:Eschar] [N/A:N/A] Exposed Structures: [N/A:N/A] Fat Layer (Subcutaneous Tissue) Exposed: Yes Fascia: No Tendon: No Muscle: No Joint: No Bone: No Epithelialization: Small (1-33%) N/A N/A Debridement: Debridement (24235- N/A N/A 11047) Pre-procedure 14:49 N/A N/A Verification/Time Out Taken: Pain Control: Lidocaine 4% Topical N/A N/A Solution Tissue Debrided: Necrotic/Eschar, N/A N/A Subcutaneous Level: Skin/Subcutaneous N/A N/A Tissue Debridement Area (sq 0.45 N/A N/A cm): Instrument: Curette N/A N/A Bleeding: Minimum N/A N/A Hemostasis Achieved: Pressure N/A N/A Procedural Pain: 0 N/A N/A Post Procedural Pain: 0 N/A N/A Debridement Treatment Procedure was tolerated N/A N/A Response: well Post Debridement 0.5x0.9x0.2 N/A N/A Measurements L x W x D (cm) Post  Debridement 0.071 N/A N/A Volume: (cm) Periwound Skin Texture: Excoriation: No N/A N/A Induration: No Callus: No Crepitus: No Rash: No Scarring: No Periwound Skin Maceration: No N/A N/A Moisture: Dry/Scaly: No Periwound Skin Color: Atrophie Blanche: No N/A N/A Cyanosis: No Ecchymosis: No Erythema: No Hemosiderin Staining: No Mottled: No Pallor: No Rubor: No Temperature: No Abnormality N/A N/A Golla, Manjinder F. ([id number]) Tenderness on Yes N/A N/A Palpation: Wound Preparation: Ulcer Cleansing: N/A N/A Rinsed/Irrigated with Saline Topical Anesthetic Applied: Other: lidocaine 4% Procedures Performed: Debridement N/A N/A Treatment Notes Electronic Signature(s) Signed: 02/26/2017 4:23:55 PM By: Linton Ham MD Entered By: Linton Ham on 02/26/2017 14:55:25 Gauss, [name] ([id number]) -------------------------------------------------------------------------------- Multi-Disciplinary Care Plan Details Patient Name: Mare Loan Date of Service: 02/26/2017 2:30 PM Medical  Record Patient Account Number: [id number] Number: Treating RN: Montey Hora Date of Birth/Sex: 02/04/1934 (83 y.o. Male) Other Clinician: Scherry Ran Primary Care Abygale Karpf: Sheppard Coil Ricardo Kayes/Extender: Kristine Royal, Referring Babe Clenney: Marlena Clipper in Treatment: 2 Active Inactive ` Abuse / Safety / Falls / Self Care Management Nursing Diagnoses: Potential for falls Goals: Patient will remain injury free related to falls Date Initiated: 02/12/2017 Target Resolution Date: 05/02/2017 Goal Status: Active Interventions: Assess fall risk on admission and as needed Notes: ` Orientation to the Wound Care Program Nursing Diagnoses: Knowledge deficit related to the wound healing center program Goals: Patient/caregiver will verbalize understanding of the [facility] Date Initiated: 02/12/2017 Target Resolution Date: 05/02/2017 Goal Status: Active Interventions: Provide education on orientation to the wound center Notes: ` Wound/Skin Impairment GRIFFITH, SANTILLI. ([id number]) Nursing Diagnoses: Impaired tissue integrity Goals: Ulcer/skin breakdown will heal within 14 weeks Date Initiated: 02/12/2017 Target Resolution Date: 05/02/2017 Goal Status: Active Interventions: Assess patient/caregiver ability to obtain necessary supplies Assess patient/caregiver ability to perform ulcer/skin care regimen upon admission and as needed Assess ulceration(s) every visit Notes: Electronic Signature(s) Signed: 02/26/2017 4:26:30 PM By: Montey Hora Entered By: Montey Hora on 02/26/2017 14:41:37 Mehlhaff, [name] ([id number]) -------------------------------------------------------------------------------- Pain Assessment Details Patient Name: Mare Loan Date of Service: 02/26/2017 2:30 PM Medical Record Patient Account Number: [id number] Number: Treating RN: Montey Hora Date of Birth/Sex: Feb 15, 1934 (82 y.o.  Male) Other Clinician: Scherry Ran Primary Care Kaydence Menard: Sheppard Coil Massiah Longanecker/Extender: Kristine Royal, Referring Tylisa Alcivar: Marlena Clipper in Treatment: 2 Active Problems Location of Pain Severity and Description of Pain Patient Has Paino No Site Locations Pain Management and Medication Current Pain Management: Notes Topical or injectable lidocaine is offered to patient for acute pain when surgical debridement is performed. If needed, Patient is instructed to use over the counter pain medication for the following 24-48 hours after debridement. Wound care MDs do not prescribed pain medications. Patient has chronic pain or uncontrolled pain. Patient has been instructed to make an appointment with their Primary Care Physician for pain management. Electronic Signature(s) Signed: 02/26/2017 4:26:30 PM By: Montey Hora Entered By: Montey Hora on 02/26/2017 14:35:56 Chrisley, [name] ([id number]) -------------------------------------------------------------------------------- Patient/Caregiver Education Details Patient Name: Mare Loan Date of Service: 02/26/2017 2:30 PM Medical Record Patient Account Number: [id number] Number: Treating RN: Montey Hora Date of Birth/Gender: [birth year] (83 y.o. Male) Other Clinician: Carbon, Treating ROBSON, McGrath Physician: Sheppard Coil Physician/Extender: Kristine Royal, Weeks in Treatment: 2 Referring Physician: Sheppard Coil Education Assessment Education Provided To: Patient Education Topics Provided Wound/Skin Impairment: Handouts: Other: wound care as ordered Methods: Demonstration, Explain/Verbal Responses: State content correctly Electronic Signature(s) Signed: 02/26/2017 4:26:30 PM By: Montey Hora Entered By: Montey Hora on 02/26/2017 14:49:27 Pullara,  DETRIC SCALISI ([id number]) -------------------------------------------------------------------------------- Wound  Assessment Details Patient Name: TREVONTAE, LINDAHL Date of Service: 02/26/2017 2:30 PM Medical Record Patient Account Number: [id number] Number: Treating RN: Montey Hora Date of Birth/Sex: 1933/11/16 (82 y.o. Male) Other Clinician: Jetty Duhamel MICHAEL Primary Care Nickcole Bralley: Sheppard Coil Delontae Lamm/Extender: Kristine Royal, Referring Mumin Denomme: Marlena Clipper in Treatment: 2 Wound Status Wound Number: 1 Primary Trauma, Other Etiology: Wound Location: Head - Parietal Wound Status: Open Wounding Event: Trauma Comorbid Hypertension, Type II Diabetes, Date Acquired: 12/13/2016 History: Neuropathy Weeks Of Treatment: 2 Clustered Wound: No Photos Photo Uploaded By: Montey Hora on 02/26/2017 16:22:27 Wound Measurements Length: (cm) 0.5 Width: (cm) 0.9 Depth: (cm) 0.1 Area: (cm) 0.353 Volume: (cm) 0.035 % Reduction in Area: 79.7% % Reduction in Volume: 79.9% Epithelialization: Small (1-33%) Tunneling: No Undermining: No Wound Description Full Thickness Without Exposed Foul Odor Afte Classification: Support Structures Slough/Fibrino Wound Margin: Flat and Intact Exudate Large Amount: Exudate Type: Serous Exudate Color: amber r Cleansing: No No Wound Bed Hefley, Alexandre F. ([id number]) Granulation Amount: Large (67-100%) Exposed Structure Granulation Quality: Red, Hyper-granulation Fascia Exposed: No Necrotic Amount: Small (1-33%) Fat Layer (Subcutaneous Tissue) Exposed: Yes Necrotic Quality: Eschar Tendon Exposed: No Muscle Exposed: No Joint Exposed: No Bone Exposed: No Periwound Skin Texture Texture Color No Abnormalities Noted: No No Abnormalities Noted: No Callus: No Atrophie Blanche: No Crepitus: No Cyanosis: No Excoriation: No Ecchymosis: No Induration: No Erythema: No Rash: No Hemosiderin Staining: No Scarring: No Mottled: No Pallor: No Moisture Rubor: No No Abnormalities Noted: No Dry / Scaly: No Temperature /  Pain Maceration: No Temperature: No Abnormality Tenderness on Palpation: Yes Wound Preparation Ulcer Cleansing: Rinsed/Irrigated with Saline Topical Anesthetic Applied: Other: lidocaine 4%, Treatment Notes Wound #1 (Head - Parietal) 1. Cleansed with: Clean wound with Normal Saline 2. Anesthetic Topical Lidocaine 4% cream to wound bed prior to debridement 4. Dressing Applied: Hydrafera Blue 5. Secondary [place] Signature(s) Signed: 02/26/2017 4:26:30 PM By: Montey Hora Entered By: Montey Hora on 02/26/2017 14:41:17 NICKY, KRAS ([id number]) -------------------------------------------------------------------------------- Vitals Details Patient Name: Mare Loan Date of Service: 02/26/2017 2:30 PM Medical Record Patient Account Number: [id number] Number: Treating RN: Montey Hora Date of Birth/Sex: 04-17-1934 (83 y.o. Male) Other Clinician: Scherry Ran Primary Care Coden Franchi: Sheppard Coil Sanda Dejoy/Extender: Kristine Royal, Referring Raymona Boss: Marlena Clipper in Treatment: 2 Vital Signs Time Taken: 14:37 Temperature (F): 98.0 Height (in): 69 Pulse (bpm): 69 Weight (lbs): 172 Respiratory Rate (breaths/min): 18 Body Mass Index (BMI): 25.4 Blood Pressure (mmHg): 132/61 Reference Range: 80 - 120 mg / dl Electronic Signature(s) Signed: 02/26/2017 4:26:30 PM By: Montey Hora Entered By: Montey Hora on 02/26/2017 14:37:32

## 2017-02-27 NOTE — Progress Notes (Signed)
ALESSIO, BOGAN ([id number]) Visit Report for 02/26/2017 Debridement Details Patient Name: [name], [name] Date of Service: 02/26/2017 2:30 PM Medical Record Patient Account Number: [id number] Number: Treating RN: Montey Hora Date of Birth/Sex: 21-Jun-1933 (82 y.o. Male) Other Clinician: [place], Treating Aly Seidenberg Provider: Sheppard Coil Provider/Extender: Kristine Royal, Referring Provider: Marlena Clipper in Treatment: 2 Debridement Performed for Wound #1 Head - Parietal Assessment: Performed By: Physician Ricard Dillon, MD Debridement: Debridement Pre-procedure Verification/Time Out Yes - 14:49 Taken: Start Time: 14:49 Pain Control: Lidocaine 4% Topical Solution Level: Skin/Subcutaneous Tissue Total Area Debrided (L x 0.5 (cm) x 0.9 (cm) = 0.45 (cm) W): Tissue and other Viable, Non-Viable, Eschar, Subcutaneous material debrided: Instrument: Curette Bleeding: Minimum Hemostasis Achieved: Pressure End Time: 14:51 Procedural Pain: 0 Post Procedural Pain: 0 Response to Treatment: Procedure was tolerated well Post Debridement Measurements of Total Wound Length: (cm) 0.5 Width: (cm) 0.9 Depth: (cm) 0.2 Volume: (cm) 0.071 Character of Wound/Ulcer Post Improved Debridement: Post Procedure Diagnosis Same as Pre-procedure [name], [name] ([id number]) Electronic Signature(s) Signed: 02/26/2017 4:23:55 PM By: Linton Ham MD Signed: 02/26/2017 4:26:30 PM By: Montey Hora Entered By: Linton Ham on 02/26/2017 14:55:47 [place], [name] ([id number]) -------------------------------------------------------------------------------- HPI Details Patient Name: [name] Date of Service: 02/26/2017 2:30 PM Medical Record Patient Account Number: [id number] Number: Treating RN: Montey Hora Date of Birth/Sex: [birth year] (83 y.o. Male) Other Clinician: [place], Treating Breella Vanostrand,  Makaela Cando Provider: Sheppard Coil Provider/Extender: Kristine Royal, Referring Provider: Marlena Clipper in Treatment: 2 History of Present Illness HPI Description: 02/12/17; this is an 83 year old man who lives in an independent retirement home "[facility]" he apparently had a syncopal spell while working outside in the heat on 12/13/16 ultimately he suffered a laceration the scalp. I don't see that this was actually sutured. He saw his primary physician on 12/20/16 noted to have a superficial abrasion at 2-3 cm and he was recommended topical antibiotic cream. He saw his primary doctor again on 02/10/17 as the laceration was not healing and was prescribed Bactroban cream which she has been applying for the last 2 days. I think he is having some difficulty given the location of this wound and the fact that it is hair is macerated over the wound. His syncopal spell was felt to be secondary to dehydration. 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 Last hemoglobin is A1c was 7.1 which for an 83 year old is really quite reasonable control. 02/18/17; this is a patient readmitted to the clinic last week with a significant laceration over the occiput of the scalp. We've been using Hydrofera Blue on this and the dimensions of come down nicely. He has home health changing the dressing. 02/26/17; necrotic debris over the wound surface required debridement today. Otherwise surface looks good dimensions are down. Using [facility] Electronic Signature(s) Signed: 02/26/2017 4:23:55 PM By: Linton Ham MD Entered By: Linton Ham on 02/26/2017 14:57:13 [name], [name] ([id number]) -------------------------------------------------------------------------------- Physical Exam Details Patient Name: [name] Date of Service: 02/26/2017 2:30 PM Medical Record Patient Account Number:  [id number] Number: Treating RN: Montey Hora Date of Birth/Sex: Oct 12, 1933 (83 y.o. Male) Other Clinician: [place], Treating Keivon Garden Provider: Sheppard Coil Provider/Extender: Kristine Royal, Referring Provider: Marlena Clipper in Treatment: 2 Constitutional Sitting or standing Blood Pressure is within target range for patient.. Pulse regular and within target range for patient.Marland Kitchen Respirations regular, non-labored and within target range.. Temperature is  normal and within the target range for the patient.Marland Kitchen appears in no distress. Notes Wound exam; the patient's wound looks healthy but there is necrotic debris around the surface of the wound. Removed with a #3 curet hemostasis with direct pressure Electronic Signature(s) Signed: 02/26/2017 4:23:55 PM By: Linton Ham MD Entered By: Linton Ham on 02/26/2017 15:01:34 [name], [name] ([id number]) -------------------------------------------------------------------------------- Physician Orders Details Patient Name: [name] Date of Service: 02/26/2017 2:30 PM Medical Record Patient Account Number: [id number] Number: Treating RN: Montey Hora Date of Birth/Sex: 01-02-1934 (83 y.o. Male) Other Clinician: [place], Treating Kavir Savoca Provider: Sheppard Coil Provider/Extender: Kristine Royal, Referring Provider: Marlena Clipper in Treatment: 2 Verbal / Phone Orders: No Diagnosis Coding Wound Cleansing Wound #1 Head - Parietal o Clean wound with Normal Saline. o May Shower, gently pat wound dry prior to applying new dressing. o Other: - May wash hair with gentle shampoo Anesthetic Wound #1 Head - Parietal o Topical Lidocaine 4% cream applied to wound bed prior to debridement Primary Wound Dressing Wound #1 Head - Parietal o Hydrafera Blue Secondary Dressing Wound #1 Head - Parietal o Boardered Foam Dressing - or telfa island  dressing Dressing Change Frequency Wound #1 Head - Parietal o Change Dressing Monday, Wednesday, Friday Follow-up Appointments Wound #1 Head - Parietal o Return Appointment in 1 week. Additional Orders / Instructions Wound #1 Head - Parietal o Increase protein intake. o Other: - Please add vitamin A, vitamin C and zinc supplements to your [place] ([id number]) Wound #1 Head - Parietal o [place] Nurse may visit PRN to address patientos wound care needs. o FACE TO FACE ENCOUNTER: MEDICARE and MEDICAID PATIENTS: I certify that this patient is under my care and that I had a face-to-face encounter that meets the physician face-to-face encounter requirements with this patient on this date. The encounter with the patient was in whole or in part for the following MEDICAL CONDITION: (primary reason for [place]) MEDICAL NECESSITY: I certify, that based on my findings, NURSING services are a medically necessary home health service. HOME BOUND STATUS: I certify that my clinical findings support that this patient is homebound (i.e., Due to illness or injury, pt requires aid of supportive devices such as crutches, cane, wheelchairs, walkers, the use of special transportation or the assistance of another person to leave their place of residence. There is a normal inability to leave the home and doing so requires considerable and taxing effort. Other absences are for medical reasons / religious services and are infrequent or of short duration when for other reasons). o If current dressing causes regression in wound condition, may D/C ordered dressing product/s and apply Normal Saline Moist Dressing daily until next [place] / Other MD appointment. [place] of regression in wound condition at [phone]. o Please direct any NON-WOUND related issues/requests for orders to patient's  Primary Care Physician Electronic Signature(s) Signed: 02/26/2017 4:23:55 PM By: Linton Ham MD Signed: 02/26/2017 4:26:30 PM By: Montey Hora Entered By: Montey Hora on 02/26/2017 14:54:23 [place], [name] ([id number]) -------------------------------------------------------------------------------- Problem List Details Patient Name: [name] Date of Service: 02/26/2017 2:30 PM Medical Record Patient Account Number: [id number] Number: Treating RN: Montey Hora Date of Birth/Sex: 1933/07/04 (83 y.o. Male) Other Clinician: [place], Treating Yocheved Depner Provider: Sheppard Coil Provider/Extender: Kristine Royal, Referring Provider: Marlena Clipper in Treatment: 2 Active Problems ICD-10 Encounter Code Description Active Date Diagnosis S01.01XD Laceration  without foreign body of scalp, subsequent 02/12/2017 Yes encounter E11.40 Type 2 diabetes mellitus with diabetic neuropathy, 02/12/2017 Yes unspecified Inactive Problems Resolved Problems Electronic Signature(s) Signed: 02/26/2017 4:23:55 PM By: Linton Ham MD Entered By: Linton Ham on 02/26/2017 14:55:13 [name], [name] ([id number]) -------------------------------------------------------------------------------- Progress Note Details Patient Name: [name] Date of Service: 02/26/2017 2:30 PM Medical Record Patient Account Number: [id number] Number: Treating RN: Montey Hora Date of Birth/Sex: [birth year] (83 y.o. Male) Other Clinician: [place], Treating Dezmen Alcock Provider: Sheppard Coil Provider/Extender: Kristine Royal, Referring Provider: Marlena Clipper in Treatment: 2 Subjective History of Present Illness (HPI) 02/12/17; this is an 83 year old man who lives in an independent retirement home "[facility]" he apparently had a syncopal spell while working outside in the heat on 12/13/16 ultimately he suffered a laceration  the scalp. I don't see that this was actually sutured. He saw his primary physician on 12/20/16 noted to have a superficial abrasion at 2-3 cm and he was recommended topical antibiotic cream. He saw his primary doctor again on 02/10/17 as the laceration was not healing and was prescribed Bactroban cream which she has been applying for the last 2 days. I think he is having some difficulty given the location of this wound and the fact that it is hair is macerated over the wound. His syncopal spell was felt to be secondary to dehydration. 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 Last hemoglobin is A1c was 7.1 which for an 83 year old is really quite reasonable control. 02/18/17; this is a patient readmitted to the clinic last week with a significant laceration over the occiput of the scalp. We've been using Hydrofera Blue on this and the dimensions of come down nicely. He has home health changing the dressing. 02/26/17; necrotic debris over the wound surface required debridement today. Otherwise surface looks good dimensions are down. Using Hydrofera Blue Objective Constitutional Sitting or standing Blood Pressure is within target range for patient.. Pulse regular and within target range for patient.Marland Kitchen Respirations regular, non-labored and within target range.. Temperature is normal and within the target range for the patient.Marland Kitchen appears in no distress. Vitals Time Taken: 2:37 PM, Height: 69 in, Weight: 172 lbs, BMI: 25.4, Temperature: 98.0 F, Pulse: 69 bpm, Respiratory Rate: 18 breaths/min, Blood Pressure: 132/61 mmHg. [name], [name] ([id number]) General Notes: Wound exam; the patient's wound looks healthy but there is necrotic debris around the surface of the wound. Removed with a #3 curet hemostasis with direct pressure Integumentary (Hair, Skin) Wound #1 status is Open. Original cause of wound was Trauma. The  wound is located on the Head - Parietal. The wound measures 0.5cm length x 0.9cm width x 0.1cm depth; 0.353cm^2 area and 0.035cm^3 volume. There is Fat Layer (Subcutaneous Tissue) Exposed exposed. There is no tunneling or undermining noted. There is a large amount of serous drainage noted. The wound margin is flat and intact. There is large (67-100%) red, hyper - granulation within the wound bed. There is a small (1-33%) amount of necrotic tissue within the wound bed including Eschar. The periwound skin appearance did not exhibit: Callus, Crepitus, Excoriation, Induration, Rash, Scarring, Dry/Scaly, Maceration, Atrophie Blanche, Cyanosis, Ecchymosis, Hemosiderin Staining, Mottled, Pallor, Rubor, Erythema. Periwound temperature was noted as No Abnormality. The periwound has tenderness on palpation. Assessment Active Problems ICD-10 S01.01XD - Laceration without foreign body of scalp, subsequent encounter E11.40 - Type 2 diabetes mellitus with diabetic neuropathy, unspecified Procedures Wound #  1 Pre-procedure diagnosis of Wound #1 is a Trauma, Other located on the Head - Parietal . There was a Skin/Subcutaneous Tissue Debridement ([id number]) debridement with total area of 0.45 sq cm performed by Ricard Dillon, MD. with the following instrument(s): Curette to remove Viable and Non-Viable tissue/material including Eschar and Subcutaneous after achieving pain control using Lidocaine 4% Topical Solution. A time out was conducted at 14:49, prior to the start of the procedure. A Minimum amount of bleeding was controlled with Pressure. The procedure was tolerated well with a pain level of 0 throughout and a pain level of 0 following the procedure. Post Debridement Measurements: 0.5cm length x 0.9cm width x 0.2cm depth; 0.071cm^3 volume. Character of Wound/Ulcer Post Debridement is improved. Post procedure Diagnosis Wound #1: Same as Pre-Procedure [name], Bryan F. ([id number]) Plan Wound  Cleansing: Wound #1 Head - Parietal: Clean wound with Normal Saline. May Shower, gently pat wound dry prior to applying new dressing. Other: - May wash hair with gentle shampoo Anesthetic: Wound #1 Head - Parietal: Topical Lidocaine 4% cream applied to wound bed prior to debridement Primary Wound Dressing: Wound #1 Head - Parietal: Hydrafera Blue Secondary Dressing: Wound #1 Head - Parietal: Boardered Foam Dressing - or telfa island dressing Dressing Change Frequency: Wound #1 Head - Parietal: Change Dressing Monday, Wednesday, Friday Follow-up Appointments: Wound #1 Head - Parietal: Return Appointment in 1 week. Additional Orders / Instructions: Wound #1 Head - Parietal: Increase protein intake. Other: - Please add vitamin A, vitamin C and zinc supplements to your diet Home Health: Wound #1 Head - Parietal: [place] Nurse may visit PRN to address patient s wound care needs. FACE TO FACE ENCOUNTER: MEDICARE and MEDICAID PATIENTS: I certify that this patient is under my care and that I had a face-to-face encounter that meets the physician face-to-face encounter requirements with this patient on this date. The encounter with the patient was in whole or in part for the following MEDICAL CONDITION: (primary reason for [place]) MEDICAL NECESSITY: I certify, that based on my findings, NURSING services are a medically necessary home health service. HOME BOUND STATUS: I certify that my clinical findings support that this patient is homebound (i.e., Due to illness or injury, pt requires aid of supportive devices such as crutches, cane, wheelchairs, walkers, the use of special transportation or the assistance of another person to leave their place of residence. There is a normal inability to leave the home and doing so requires considerable and taxing effort. Other absences are for medical reasons / religious services and are infrequent or of short  duration when for other reasons). If current dressing causes regression in wound condition, may D/C ordered dressing product/s and apply Normal Saline Moist Dressing daily until next [place] / Other MD appointment. [place] of regression in wound condition at [phone]. Please direct any NON-WOUND related issues/requests for orders to patient's Primary Care Physician [name], [name] ([id number]) 1 continue with [facility] with gauze this is being changed by home health. Dimensions of the wound are down by greater than 50% debridement today to remove necrotic material from the wound surface nonviable material on the wound surface. #2 this should progress towards healing in 2 weeks Electronic Signature(s) Signed: 02/26/2017 4:23:55 PM By: Linton Ham MD Entered By: Linton Ham on 02/26/2017 15:02:15 [name], [name] ([id number]) -------------------------------------------------------------------------------- SuperBill Details Patient Name: [name] Date of Service: 02/26/2017 Medical Record Patient Account Number: [id number] Number: Treating  RN: Montey Hora Date of Birth/Sex: [birth year] (82 y.o. Male) Other Clinician: [place], Treating Juno Alers Provider: Sheppard Coil Provider/Extender: Kristine Royal, Weeks in Treatment: 2 Referring Provider: Sheppard Coil Diagnosis Coding ICD-10 Codes Code Description S01.01XD Laceration without foreign body of scalp, subsequent encounter E11.40 Type 2 diabetes mellitus with diabetic neuropathy, unspecified Facility Procedures CPT4 Code Description: [id number] 11042 - DEB SUBQ TISSUE 20 SQ CM/< ICD-10 Description Diagnosis S01.01XD Laceration without foreign body of scalp, subsequ E11.40 Type 2 diabetes mellitus with diabetic neuropathy Modifier: ent encounte , unspecifie Quantity: 1 r d Physician Procedures CPT4 Code Description: [id number] 11042 - WC PHYS SUBQ TISS  20 SQ CM ICD-10 Description Diagnosis S01.01XD Laceration without foreign body of scalp, subsequ E11.40 Type 2 diabetes mellitus with diabetic neuropathy Modifier: ent encounte , unspecifie Quantity: 1 r d Electronic Signature(s) Signed: 02/26/2017 4:23:55 PM By: Linton Ham MD Entered By: Linton Ham on 02/26/2017 15:02:37

## 2017-02-28 DIAGNOSIS — Z9181 History of falling: Secondary | ICD-10-CM | POA: Diagnosis not present

## 2017-02-28 DIAGNOSIS — Z87891 Personal history of nicotine dependence: Secondary | ICD-10-CM | POA: Diagnosis not present

## 2017-02-28 DIAGNOSIS — S0100XA Unspecified open wound of scalp, initial encounter: Secondary | ICD-10-CM | POA: Diagnosis not present

## 2017-02-28 DIAGNOSIS — I10 Essential (primary) hypertension: Secondary | ICD-10-CM | POA: Diagnosis not present

## 2017-02-28 DIAGNOSIS — E1142 Type 2 diabetes mellitus with diabetic polyneuropathy: Secondary | ICD-10-CM | POA: Diagnosis not present

## 2017-02-28 DIAGNOSIS — Z8546 Personal history of malignant neoplasm of prostate: Secondary | ICD-10-CM | POA: Diagnosis not present

## 2017-03-03 DIAGNOSIS — I10 Essential (primary) hypertension: Secondary | ICD-10-CM | POA: Diagnosis not present

## 2017-03-03 DIAGNOSIS — Z9181 History of falling: Secondary | ICD-10-CM | POA: Diagnosis not present

## 2017-03-03 DIAGNOSIS — S0100XA Unspecified open wound of scalp, initial encounter: Secondary | ICD-10-CM | POA: Diagnosis not present

## 2017-03-03 DIAGNOSIS — E1142 Type 2 diabetes mellitus with diabetic polyneuropathy: Secondary | ICD-10-CM | POA: Diagnosis not present

## 2017-03-03 DIAGNOSIS — Z8546 Personal history of malignant neoplasm of prostate: Secondary | ICD-10-CM | POA: Diagnosis not present

## 2017-03-03 DIAGNOSIS — Z87891 Personal history of nicotine dependence: Secondary | ICD-10-CM | POA: Diagnosis not present

## 2017-03-04 ENCOUNTER — Encounter: Payer: Medicare Other | Admitting: Internal Medicine

## 2017-03-04 DIAGNOSIS — E1140 Type 2 diabetes mellitus with diabetic neuropathy, unspecified: Secondary | ICD-10-CM | POA: Diagnosis not present

## 2017-03-04 DIAGNOSIS — Z7984 Long term (current) use of oral hypoglycemic drugs: Secondary | ICD-10-CM | POA: Diagnosis not present

## 2017-03-04 DIAGNOSIS — S0101XD Laceration without foreign body of scalp, subsequent encounter: Secondary | ICD-10-CM | POA: Diagnosis not present

## 2017-03-04 DIAGNOSIS — I10 Essential (primary) hypertension: Secondary | ICD-10-CM | POA: Diagnosis not present

## 2017-03-04 DIAGNOSIS — Z87891 Personal history of nicotine dependence: Secondary | ICD-10-CM | POA: Diagnosis not present

## 2017-03-04 DIAGNOSIS — S0101XA Laceration without foreign body of scalp, initial encounter: Secondary | ICD-10-CM | POA: Diagnosis not present

## 2017-03-04 DIAGNOSIS — Z8546 Personal history of malignant neoplasm of prostate: Secondary | ICD-10-CM | POA: Diagnosis not present

## 2017-03-05 DIAGNOSIS — Z961 Presence of intraocular lens: Secondary | ICD-10-CM | POA: Diagnosis not present

## 2017-03-05 DIAGNOSIS — S0100XA Unspecified open wound of scalp, initial encounter: Secondary | ICD-10-CM | POA: Diagnosis not present

## 2017-03-05 DIAGNOSIS — H2511 Age-related nuclear cataract, right eye: Secondary | ICD-10-CM | POA: Diagnosis not present

## 2017-03-05 DIAGNOSIS — H35373 Puckering of macula, bilateral: Secondary | ICD-10-CM | POA: Diagnosis not present

## 2017-03-05 DIAGNOSIS — E119 Type 2 diabetes mellitus without complications: Secondary | ICD-10-CM | POA: Diagnosis not present

## 2017-03-05 DIAGNOSIS — Z8546 Personal history of malignant neoplasm of prostate: Secondary | ICD-10-CM | POA: Diagnosis not present

## 2017-03-05 DIAGNOSIS — Z9181 History of falling: Secondary | ICD-10-CM | POA: Diagnosis not present

## 2017-03-05 DIAGNOSIS — E1142 Type 2 diabetes mellitus with diabetic polyneuropathy: Secondary | ICD-10-CM | POA: Diagnosis not present

## 2017-03-05 DIAGNOSIS — I10 Essential (primary) hypertension: Secondary | ICD-10-CM | POA: Diagnosis not present

## 2017-03-05 DIAGNOSIS — Z87891 Personal history of nicotine dependence: Secondary | ICD-10-CM | POA: Diagnosis not present

## 2017-03-05 LAB — HM DIABETES EYE EXAM

## 2017-03-05 NOTE — Progress Notes (Signed)
KIEN, MIRSKY ([id number]) Visit Report for 03/04/2017 HPI Details Patient Name: GRANTHAM, HIPPERT Date of Service: 03/04/2017 1:15 PM Medical Record Patient Account Number: [id number] Number: Treating RN: Date of Birth/Sex: 12/26/1933 (83 y.o. Male) Other Clinician: [place], Treating ROBSON, MICHAEL Provider: Sheppard Coil Provider/Extender: Kristine Royal, Referring Provider: Marlena Clipper in Treatment: 2 History of Present Illness HPI Description: 02/12/17; this is an 83 year old man who lives in an independent retirement home "[facility]" he apparently had a syncopal spell while working outside in the heat on 12/13/16 ultimately he suffered a laceration the scalp. I don't see that this was actually sutured. He saw his primary physician on 12/20/16 noted to have a superficial abrasion at 2-3 cm and he was recommended topical antibiotic cream. He saw his primary doctor again on 02/10/17 as the laceration was not healing and was prescribed Bactroban cream which she has been applying for the last 2 days. I think he is having some difficulty given the location of this wound and the fact that it is hair is macerated over the wound. His syncopal spell was felt to be secondary to dehydration. 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 Last hemoglobin is A1c was 7.1 which for an 83 year old is really quite reasonable control. 02/18/17; this is a patient readmitted to the clinic last week with a significant laceration over the occiput of the scalp. We've been using Hydrofera Blue on this and the dimensions of come down nicely. He has home health changing the dressing. 02/26/17; necrotic debris over the wound surface required debridement today. Otherwise surface looks good dimensions are down. Using [facility] 03/14/17; wound appears smaller and does not require debridement using  Hydrofera Blue although I might have thought this would close down by now Electronic Signature(s) Signed: 03/04/2017 4:33:30 PM By: Linton Ham MD Entered By: Linton Ham on 03/04/2017 14:06:47 Mare Loan ([id number]) -------------------------------------------------------------------------------- Physical Exam Details Patient Name: Mare Loan Date of Service: 03/04/2017 1:15 PM Medical Record Patient Account Number: [id number] Number: Treating RN: Date of Birth/Sex: 1933-09-27 (83 y.o. Male) Other Clinician: [place], Treating ROBSON, MICHAEL Provider: Sheppard Coil Provider/Extender: Kristine Royal, Referring Provider: Marlena Clipper in Treatment: 2 Constitutional Sitting or standing Blood Pressure is within target range for patient.. Pulse regular and within target range for patient.Marland Kitchen Respirations regular, non-labored and within target range.. Temperature is normal and within the target range for the patient.Marland Kitchen appears in no distress. Notes Wound exam; the patient's wound looks healthy. Nice granulation tissue. I think this is epithelializing. No debridement is required Electronic Signature(s) Signed: 03/04/2017 4:33:30 PM By: Linton Ham MD Entered By: Linton Ham on 03/04/2017 14:07:31 Lill[place]idge, [name] ([id number]) -------------------------------------------------------------------------------- Physician Orders Details Patient Name: Mare Loan Date of Service: 03/04/2017 1:15 PM Medical Record Patient Account Number: [id number] Number: Treating RN: Montey Hora Date of Birth/Sex: 1933/06/29 (83 y.o. Male) Other Clinician: [place], Treating ROBSON, MICHAEL Provider: Sheppard Coil Provider/Extender: Kristine Royal, Referring Provider: Marlena Clipper in Treatment: 2 Verbal / Phone Orders: Yes Clinician: Montey Hora Read Back and Verified: Yes Diagnosis Coding Wound Cleansing Wound  #1 Head - Parietal o Clean wound with Normal Saline. o May Shower, gently pat wound dry prior to applying new dressing. o Other: - May wash hair with gentle shampoo Anesthetic Wound #1 Head - Parietal o Topical Lidocaine 4% cream applied to wound bed prior to debridement Primary Wound Dressing Wound #1  Head - Parietal o Hydrogel o Prisma Ag - moisten with hydrogel Secondary Dressing Wound #1 Head - Parietal o Other - telfa island dressing Dressing Change Frequency Wound #1 Head - Parietal o Change Dressing Monday, Wednesday, Friday Follow-up Appointments Wound #1 Head - Parietal o Return Appointment in 2 weeks. Additional Orders / Instructions Wound #1 Head - Parietal o Increase protein intake. o Other: - Please add vitamin A, vitamin C and zinc supplements to your diet CIRO, TASHIRO ([id number]) [place] #1 [place] Nurse may visit PRN to address patientos wound care needs. o FACE TO FACE ENCOUNTER: MEDICARE and MEDICAID PATIENTS: I certify that this patient is under my care and that I had a face-to-face encounter that meets the physician face-to-face encounter requirements with this patient on this date. The encounter with the patient was in whole or in part for the following MEDICAL CONDITION: (primary reason for [place]) MEDICAL NECESSITY: I certify, that based on my findings, NURSING services are a medically necessary home health service. HOME BOUND STATUS: I certify that my clinical findings support that this patient is homebound (i.e., Due to illness or injury, pt requires aid of supportive devices such as crutches, cane, wheelchairs, walkers, the use of special transportation or the assistance of another person to leave their place of residence. There is a normal inability to leave the home and doing so requires considerable and taxing effort. Other absences are for  medical reasons / religious services and are infrequent or of short duration when for other reasons). o If current dressing causes regression in wound condition, may D/C ordered dressing product/s and apply Normal Saline Moist Dressing daily until next [place] / Other MD appointment. [place] of regression in wound condition at [phone]. o Please direct any NON-WOUND related issues/requests for orders to patient's Primary Care Physician Electronic Signature(s) Signed: 03/04/2017 4:19:28 PM By: Alric Quan Signed: 03/04/2017 4:33:30 PM By: Linton Ham MD Entered By: Alric Quan on 03/04/2017 13:57:49 JAQWAN, WIEBER ([id number]) -------------------------------------------------------------------------------- Problem List Details Patient Name: Mare Loan Date of Service: 03/04/2017 1:15 PM Medical Record Patient Account Number: [id number] Number: Treating RN: Date of Birth/Sex: 30-Nov-1933 (83 y.o. Male) Other Clinician: [place], Treating ROBSON, MICHAEL Provider: Sheppard Coil Provider/Extender: Kristine Royal, Referring Provider: Marlena Clipper in Treatment: 2 Active Problems ICD-10 Encounter Code Description Active Date Diagnosis S01.01XD Laceration without foreign body of scalp, subsequent 02/12/2017 Yes encounter E11.40 Type 2 diabetes mellitus with diabetic neuropathy, 02/12/2017 Yes unspecified Inactive Problems Resolved Problems Electronic Signature(s) Signed: 03/04/2017 4:33:30 PM By: Linton Ham MD Entered By: Linton Ham on 03/04/2017 14:05:19 Mastel, [name] ([id number]) -------------------------------------------------------------------------------- Progress Note Details Patient Name: Mare Loan Date of Service: 03/04/2017 1:15 PM Medical Record Patient Account Number: [id number] Number: Treating RN: Date of Birth/Sex: [birth year] (83 y.o. Male) Other  Clinician: [place], Treating ROBSON, MICHAEL Provider: Sheppard Coil Provider/Extender: Kristine Royal, Referring Provider: Marlena Clipper in Treatment: 2 Subjective History of Present Illness (HPI) 02/12/17; this is an 83 year old man who lives in an independent retirement home "[facility]" he apparently had a syncopal spell while working outside in the heat on 12/13/16 ultimately he suffered a laceration the scalp. I don't see that this was actually sutured. He saw his primary physician on 12/20/16 noted to have a superficial abrasion at 2-3 cm and he was recommended topical antibiotic cream. He saw his primary doctor again on 02/10/17 as  the laceration was not healing and was prescribed Bactroban cream which she has been applying for the last 2 days. I think he is having some difficulty given the location of this wound and the fact that it is hair is macerated over the wound. His syncopal spell was felt to be secondary to dehydration. 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 Last hemoglobin is A1c was 7.1 which for an 83 year old is really quite reasonable control. 02/18/17; this is a patient readmitted to the clinic last week with a significant laceration over the occiput of the scalp. We've been using Hydrofera Blue on this and the dimensions of come down nicely. He has home health changing the dressing. 02/26/17; necrotic debris over the wound surface required debridement today. Otherwise surface looks good dimensions are down. Using [facility] 03/14/17; wound appears smaller and does not require debridement using Hydrofera Blue although I might have thought this would close down by now Objective Constitutional Sitting or standing Blood Pressure is within target range for patient.. Pulse regular and within target range for patient.Marland Kitchen Respirations regular, non-labored and within target  range.. Temperature is normal and within the target range for the patient.Marland Kitchen appears in no distress. JENNA, ARDOIN. ([id number]) Vitals Time Taken: 1:20 PM, Height: 69 in, Weight: 172 lbs, BMI: 25.4, Temperature: 98.3 F, Pulse: 65 bpm, Respiratory Rate: 18 breaths/min, Blood Pressure: 127/78 mmHg. General Notes: Wound exam; the patient's wound looks healthy. Nice granulation tissue. I think this is epithelializing. No debridement is required Integumentary (Hair, Skin) Wound #1 status is Open. Original cause of wound was Trauma. The wound is located on the Head - Parietal. The wound measures 0.5cm length x 0.8cm width x 0.1cm depth; 0.314cm^2 area and 0.031cm^3 volume. There is Fat Layer (Subcutaneous Tissue) Exposed exposed. There is no tunneling or undermining noted. There is a large amount of serous drainage noted. The wound margin is flat and intact. There is large (67-100%) red, hyper - granulation within the wound bed. There is a small (1-33%) amount of necrotic tissue within the wound bed including Eschar. The periwound skin appearance did not exhibit: Callus, Crepitus, Excoriation, Induration, Rash, Scarring, Dry/Scaly, Maceration, Atrophie Blanche, Cyanosis, Ecchymosis, Hemosiderin Staining, Mottled, Pallor, Rubor, Erythema. Periwound temperature was noted as No Abnormality. The periwound has tenderness on palpation. Assessment Active Problems ICD-10 S01.01XD - Laceration without foreign body of scalp, subsequent encounter E11.40 - Type 2 diabetes mellitus with diabetic neuropathy, unspecified Plan Wound Cleansing: Wound #1 Head - Parietal: Clean wound with Normal Saline. May Shower, gently pat wound dry prior to applying new dressing. Other: - May wash hair with gentle shampoo Anesthetic: Wound #1 Head - Parietal: Topical Lidocaine 4% cream applied to wound bed prior to debridement Primary Wound Dressing: Wound #1 Head - Parietal: Hydrogel Prisma Ag - moisten with  hydrogel Secondary Dressing: GASPER, HOPES. ([id number]) Wound #1 Head - Parietal: Other - telfa island dressing Dressing Change Frequency: Wound #1 Head - Parietal: Change Dressing Monday, Wednesday, Friday Follow-up Appointments: Wound #1 Head - Parietal: Return Appointment in 2 weeks. Additional Orders / Instructions: Wound #1 Head - Parietal: Increase protein intake. Other: - Please add vitamin A, vitamin C and zinc supplements to your diet Home Health: Wound #1 Head - Parietal: [place] Nurse may visit PRN to address patient s wound care needs. FACE TO FACE ENCOUNTER: MEDICARE and MEDICAID PATIENTS: I certify that this patient  is under my care and that I had a face-to-face encounter that meets the physician face-to-face encounter requirements with this patient on this date. The encounter with the patient was in whole or in part for the following MEDICAL CONDITION: (primary reason for [place]) MEDICAL NECESSITY: I certify, that based on my findings, NURSING services are a medically necessary home health service. HOME BOUND STATUS: I certify that my clinical findings support that this patient is homebound (i.e., Due to illness or injury, pt requires aid of supportive devices such as crutches, cane, wheelchairs, walkers, the use of special transportation or the assistance of another person to leave their place of residence. There is a normal inability to leave the home and doing so requires considerable and taxing effort. Other absences are for medical reasons / religious services and are infrequent or of short duration when for other reasons). If current dressing causes regression in wound condition, may D/C ordered dressing product/s and apply Normal Saline Moist Dressing daily until next [place] / Other MD appointment. [place] of regression in wound condition at [phone]. Please direct any NON-WOUND  related issues/requests for orders to patient's Primary Care Physician I changed the primary dressing to silver collagen should be healed next wek Electronic Signature(s) Signed: 03/04/2017 4:33:30 PM By: Linton Ham MD Entered By: Linton Ham on 03/04/2017 14:11:06 [place], [name] ([id number]) -------------------------------------------------------------------------------- SuperBill Details Patient Name: Mare Loan Date of Service: 03/04/2017 Medical Record Patient Account Number: [id number] Number: Treating RN: Date of Birth/Sex: 06-Mar-1934 (83 y.o. Male) Other Clinician: [place], Treating ROBSON, MICHAEL Provider: Sheppard Coil Provider/Extender: Kristine Royal, Weeks in Treatment: 2 Referring Provider: Sheppard Coil Diagnosis Coding ICD-10 Codes Code Description S01.01XD Laceration without foreign body of scalp, subsequent encounter E11.40 Type 2 diabetes mellitus with diabetic neuropathy, unspecified Physician Procedures CPT4 Code Description: [id number] - WC PHYS LEVEL 2 - EST PT ICD-10 Description Diagnosis S01.01XD Laceration without foreign body of scalp, subsequ Modifier: ent encounter Quantity: 1 Electronic Signature(s) Signed: 03/04/2017 4:33:30 PM By: Linton Ham MD Entered By: Linton Ham on 03/04/2017 14:11:27

## 2017-03-06 NOTE — Progress Notes (Signed)
DELOSS, AMICO ([id number]) Visit Report for 03/04/2017 Arrival Information Details Patient Name: [name], [name] Date of Service: 03/04/2017 1:15 PM Medical Record Patient Account Number: [id number] Number: Treating RN: Montey Hora Date of Birth/Sex: Oct 19, 1933 (82 y.o. Male) Other Clinician: Scherry Ran Primary Care Lya Holben: Sheppard Coil Nickisha Hum/Extender: Kristine Royal, Referring Gwynn Crossley: Marlena Clipper in Treatment: 2 Visit Information History Since Last Visit Added or deleted any medications: No Patient Arrived: Ambulatory Any new allergies or adverse reactions: No Arrival Time: 13:17 Had a fall or experienced change in No Accompanied By: self activities of daily living that may affect Transfer Assistance: None risk of falls: Patient Identification Verified: Yes Signs or symptoms of abuse/neglect since last No Secondary Verification Process Yes visito Completed: Hospitalized since last visit: No Patient Has Alerts: Yes Has Dressing in Place as Prescribed: Yes Patient Alerts: DM II Pain Present Now: No Electronic Signature(s) Signed: 03/04/2017 4:33:51 PM By: Montey Hora Entered By: Montey Hora on 03/04/2017 13:17:45 [name], [name] ([id number]) -------------------------------------------------------------------------------- Clinic Level of Care Assessment Details Patient Name: [name] Date of Service: 03/04/2017 1:15 PM Medical Record Patient Account Number: [id number] Number: Treating RN: Montey Hora Date of Birth/Sex: 1933/06/01 (82 y.o. Male) Other Clinician: Jetty Duhamel Harper Primary Care Shaquana Buel: Sheppard Coil Naftula Donahue/Extender: Kristine Royal, Referring Kathee Tumlin: Marlena Clipper in Treatment: 2 Clinic Level of Care Assessment Items TOOL 4 Quantity Score []  - Use when only an EandM is performed on FOLLOW-UP visit 0 ASSESSMENTS - Nursing Assessment /  Reassessment X - Reassessment of Co-morbidities (includes updates in patient status) 1 10 X - Reassessment of Adherence to Treatment Plan 1 5 ASSESSMENTS - Wound and Skin Assessment / Reassessment X - Simple Wound Assessment / Reassessment - one wound 1 5 []  - Complex Wound Assessment / Reassessment - multiple wounds 0 []  - Dermatologic / Skin Assessment (not related to wound area) 0 ASSESSMENTS - Focused Assessment []  - Circumferential Edema Measurements - multi extremities 0 []  - Nutritional Assessment / Counseling / Intervention 0 []  - Lower Extremity Assessment (monofilament, tuning fork, pulses) 0 []  - Peripheral Arterial Disease Assessment (using hand held doppler) 0 ASSESSMENTS - Ostomy and/or Continence Assessment and Care []  - Incontinence Assessment and Management 0 []  - Ostomy Care Assessment and Management (repouching, etc.) 0 PROCESS - Coordination of Care X - Simple Patient / Family Education for ongoing care 1 15 []  - Complex (extensive) Patient / Family Education for ongoing care 0 []  - Staff obtains Consents, Records, Test Results / Process Orders 0 [name], Bryan F. ([id number]) []  - Staff telephones HHA, Nursing Homes / Clarify orders / etc 0 []  - Routine Transfer to another Facility (non-emergent condition) 0 []  - Routine Hospital Admission (non-emergent condition) 0 []  - New Admissions / Biomedical engineer / Ordering NPWT, Apligraf, etc. 0 []  - Emergency Hospital Admission (emergent condition) 0 X - Simple Discharge Coordination 1 10 []  - Complex (extensive) Discharge Coordination 0 PROCESS - Special Needs []  - Pediatric / Minor Patient Management 0 []  - Isolation Patient Management 0 []  - Hearing / Language / Visual special needs 0 []  - Assessment of Community assistance (transportation, D/C planning, etc.) 0 []  - Additional assistance / Altered mentation 0 []  - Support Surface(s) Assessment (bed, cushion, seat, etc.) 0 INTERVENTIONS - Wound Cleansing /  Measurement X - Simple Wound Cleansing - one wound 1 5 []  - Complex Wound Cleansing - multiple wounds 0 X - Wound Imaging (photographs - any number of wounds) 1 5 []  -  Wound Tracing (instead of photographs) 0 X - Simple Wound Measurement - one wound 1 5 []  - Complex Wound Measurement - multiple wounds 0 INTERVENTIONS - Wound Dressings X - Small Wound Dressing one or multiple wounds 1 10 []  - Medium Wound Dressing one or multiple wounds 0 []  - Large Wound Dressing one or multiple wounds 0 []  - Application of Medications - topical 0 []  - Application of Medications - injection 0 [name], Bryan F. ([id number]) INTERVENTIONS - Miscellaneous []  - External ear exam 0 []  - Specimen Collection (cultures, biopsies, blood, body fluids, etc.) 0 []  - Specimen(s) / Culture(s) sent or taken to Lab for analysis 0 []  - Patient Transfer (multiple staff / Harrel Lemon Lift / Similar devices) 0 []  - Simple Staple / Suture removal (25 or less) 0 []  - Complex Staple / Suture removal (26 or more) 0 []  - Hypo / Hyperglycemic Management (close monitor of Blood Glucose) 0 []  - Ankle / Brachial Index (ABI) - do not check if billed separately 0 X - Vital Signs 1 5 Has the patient been seen at the hospital within the last three years: Yes Total Score: 75 Level Of Care: New/Established - Level 2 Electronic Signature(s) Signed: 03/05/2017 4:35:12 PM By: Montey Hora Entered By: Montey Hora on 03/05/2017 16:30:44 [name], [name] ([id number]) -------------------------------------------------------------------------------- Encounter Discharge Information Details Patient Name: [name] Date of Service: 03/04/2017 1:15 PM Medical Record Patient Account Number: [id number] Number: Treating RN: Ahmed Prima Date of Birth/Sex: 1933-08-20 (82 y.o. Male) Other Clinician: Scherry Ran Primary Care Tobechukwu Emmick: Sheppard Coil Hyrum Shaneyfelt/Extender: Kristine Royal, Referring  Euleta Belson: Marlena Clipper in Treatment: 2 Encounter Discharge Information Items Discharge Pain Level: 0 Discharge Condition: Stable Ambulatory Status: Ambulatory Discharge Destination: Home Transportation: Private Auto Accompanied By: self Schedule Follow-up Appointment: Yes Medication Reconciliation completed and provided to Patient/Care No Randy Castrejon: Provided on Clinical Summary of Care: 03/04/2017 Form Type Recipient Paper Patient JC Electronic Signature(s) Signed: 03/05/2017 8:44:55 AM By: Ruthine Dose Entered By: Ruthine Dose on 03/04/2017 13:52:49 Schult, [name] ([id number]) -------------------------------------------------------------------------------- Lower Extremity Assessment Details Patient Name: [name] Date of Service: 03/04/2017 1:15 PM Medical Record Patient Account Number: [id number] Number: Treating RN: Montey Hora Date of Birth/Sex: [birth year] (82 y.o. Male) Other Clinician: Scherry Ran Primary Care Trameka Dorough: Sheppard Coil Zackeriah Kissler/Extender: Kristine Royal, Referring Carlo Lorson: Marlena Clipper in Treatment: 2 Electronic Signature(s) Signed: 03/04/2017 4:33:51 PM By: Montey Hora Entered By: Montey Hora on 03/04/2017 13:21:52 Dunsworth, [name] ([id number]) -------------------------------------------------------------------------------- Multi Wound Chart Details Patient Name: [name] Date of Service: 03/04/2017 1:15 PM Medical Record Patient Account Number: [id number] Number: Treating RN: Montey Hora Date of Birth/Sex: 08-12-1933 (82 y.o. Male) Other Clinician: Scherry Ran Primary Care Cailyn Houdek: Sheppard Coil Danyel Tobey/Extender: Kristine Royal, Referring Gwendloyn Forsee: Marlena Clipper in Treatment: 2 Vital Signs Height(in): 69 Pulse(bpm): 65 Weight(lbs): 172 Blood Pressure 127/78 (mmHg): Body Mass Index(BMI): 25 Temperature(F): 98.3 Respiratory  Rate 18 (breaths/min): Photos: [1:No Photos] [N/A:N/A] Wound Location: [1:Head - Parietal] [N/A:N/A] Wounding Event: [1:Trauma] [N/A:N/A] Primary Etiology: [1:Trauma, Other] [N/A:N/A] Comorbid History: [1:Hypertension, Type II Diabetes, Neuropathy] [N/A:N/A] Date Acquired: [1:12/13/2016] [N/A:N/A] Weeks of Treatment: [1:2] [N/A:N/A] Wound Status: [1:Open] [N/A:N/A] Measurements L x W x D 0.5x0.8x0.1 [N/A:N/A] (cm) Area (cm) : [1:0.314] [N/A:N/A] Volume (cm) : [1:0.031] [N/A:N/A] % Reduction in Area: [1:81.90%] [N/A:N/A] % Reduction in Volume: 82.20% [N/A:N/A] Classification: [1:Full Thickness Without Exposed Support Structures] [N/A:N/A] Exudate Amount: [1:Large] [N/A:N/A] Exudate Type: [1:Serous] [N/A:N/A] Exudate Color: [1:amber] [N/A:N/A] Wound Margin: [1:Flat and Intact] [N/A:N/A]  Granulation Amount: [1:Large (67-100%)] [N/A:N/A] Granulation Quality: [1:Red, Hyper-granulation] [N/A:N/A] Necrotic Amount: [1:Small (1-33%)] [N/A:N/A] Necrotic Tissue: [1:Eschar] [N/A:N/A] Exposed Structures: [N/A:N/A] Fat Layer (Subcutaneous Tissue) Exposed: Yes Fascia: No Tendon: No Muscle: No Joint: No Bone: No Epithelialization: Small (1-33%) N/A N/A Periwound Skin Texture: Excoriation: No N/A N/A Induration: No Callus: No Crepitus: No Rash: No Scarring: No Periwound Skin Maceration: No N/A N/A Moisture: Dry/Scaly: No Periwound Skin Color: Atrophie Blanche: No N/A N/A Cyanosis: No Ecchymosis: No Erythema: No Hemosiderin Staining: No Mottled: No Pallor: No Rubor: No Temperature: No Abnormality N/A N/A Tenderness on Yes N/A N/A Palpation: Wound Preparation: Ulcer Cleansing: N/A N/A Rinsed/Irrigated with Saline Topical Anesthetic Applied: Other: lidocaine 4% Treatment Notes Wound #1 (Head - Parietal) 1. Cleansed with: Clean wound with Normal Saline 2. Anesthetic Topical Lidocaine 4% cream to wound bed prior to debridement 4. Dressing Applied: Hydrogel Prisma  Ag 5. Secondary [place] Signature(s) [name], [name] ([id number]) Signed: 03/04/2017 4:33:30 PM By: Linton Ham MD Entered By: Linton Ham on 03/04/2017 14:05:33 [name], [name] ([id number]) -------------------------------------------------------------------------------- Multi-Disciplinary Care Plan Details Patient Name: [name] Date of Service: 03/04/2017 1:15 PM Medical Record Patient Account Number: [id number] Number: Treating RN: Montey Hora Date of Birth/Sex: 04/01/1934 (83 y.o. Male) Other Clinician: Scherry Ran Primary Care Virlan Kempker: Sheppard Coil Deneka Greenwalt/Extender: Kristine Royal, Referring Doneisha Ivey: Marlena Clipper in Treatment: 2 Active Inactive ` Abuse / Safety / Falls / Self Care Management Nursing Diagnoses: Potential for falls Goals: Patient will remain injury free related to falls Date Initiated: 02/12/2017 Target Resolution Date: 05/02/2017 Goal Status: Active Interventions: Assess fall risk on admission and as needed Notes: ` Orientation to the Wound Care Program Nursing Diagnoses: Knowledge deficit related to the wound healing center program Goals: Patient/caregiver will verbalize understanding of the [facility] Program Date Initiated: 02/12/2017 Target Resolution Date: 05/02/2017 Goal Status: Active Interventions: Provide education on orientation to the wound center Notes: ` Wound/Skin Impairment [name], KINNARD. ([id number]) Nursing Diagnoses: Impaired tissue integrity Goals: Ulcer/skin breakdown will heal within 14 weeks Date Initiated: 02/12/2017 Target Resolution Date: 05/02/2017 Goal Status: Active Interventions: Assess patient/caregiver ability to obtain necessary supplies Assess patient/caregiver ability to perform ulcer/skin care regimen upon admission and as needed Assess ulceration(s) every visit Notes: Electronic Signature(s) Signed:  03/04/2017 4:33:51 PM By: Montey Hora Entered By: Montey Hora on 03/04/2017 13:21:55 [name] ([id number]) -------------------------------------------------------------------------------- Pain Assessment Details Patient Name: [name] Date of Service: 03/04/2017 1:15 PM Medical Record Patient Account Number: [id number] Number: Treating RN: Montey Hora Date of Birth/Sex: 14-Sep-1933 (82 y.o. Male) Other Clinician: Scherry Ran Primary Care Ziah Turvey: Sheppard Coil Eyvette Cordon/Extender: Kristine Royal, Referring Furkan Keenum: Marlena Clipper in Treatment: 2 Active Problems Location of Pain Severity and Description of Pain Patient Has Paino No Site Locations Pain Management and Medication Current Pain Management: Electronic Signature(s) Signed: 03/04/2017 4:33:51 PM By: Montey Hora Entered By: Montey Hora on 03/04/2017 13:19:04 [name] ([id number]) -------------------------------------------------------------------------------- Patient/Caregiver Education Details Patient Name: [name] Date of Service: 03/04/2017 1:15 PM Medical Record Patient Account Number: [id number] Number: Treating RN: Ahmed Prima Date of Birth/Gender: January 16, 1934 (82 y.o. Male) Other Clinician: [place], Treating [name], [name] Physician: Sheppard Coil Physician/Extender: Kristine Royal, Weeks in Treatment: 2 Referring Physician: Sheppard Coil Education Assessment Education Provided To: Patient Education Topics Provided Wound/Skin Impairment: Handouts: Other: change dressing as ordered Methods: Demonstration, Explain/Verbal Responses: State content correctly Electronic Signature(s) Signed: 03/04/2017 4:19:28 PM By: Alric Quan Entered By: Alric Quan on 03/04/2017  13:24:03 [name], [name] ([id number]) -------------------------------------------------------------------------------- Wound  Assessment Details Patient Name: [name], [name] Date of Service: 03/04/2017 1:15 PM Medical Record Patient Account Number: [id number] Number: Treating RN: Montey Hora Date of Birth/Sex: [birth year] (82 y.o. Male) Other Clinician: Jetty Duhamel MICHAEL Primary Care Joud Pettinato: Sheppard Coil Wyndi Northrup/Extender: Kristine Royal, Referring Tajuana Kniskern: Marlena Clipper in Treatment: 2 Wound Status Wound Number: 1 Primary Trauma, Other Etiology: Wound Location: Head - Parietal Wound Status: Open Wounding Event: Trauma Comorbid Hypertension, Type II Diabetes, Date Acquired: 12/13/2016 History: Neuropathy Weeks Of Treatment: 2 Clustered Wound: No Photos Photo Uploaded By: Montey Hora on 03/04/2017 15:57:03 Wound Measurements Length: (cm) 0.5 Width: (cm) 0.8 Depth: (cm) 0.1 Area: (cm) 0.314 Volume: (cm) 0.031 % Reduction in Area: 81.9% % Reduction in Volume: 82.2% Epithelialization: Small (1-33%) Tunneling: No Undermining: No Wound Description Full Thickness Without Exposed Foul Odor Afte Classification: Support Structures Slough/Fibrino Wound Margin: Flat and Intact Exudate Large Amount: Exudate Type: Serous Exudate Color: amber r Cleansing: No No Wound Bed [name], Bryan F. ([id number]) Granulation Amount: Large (67-100%) Exposed Structure Granulation Quality: Red, Hyper-granulation Fascia Exposed: No Necrotic Amount: Small (1-33%) Fat Layer (Subcutaneous Tissue) Exposed: Yes Necrotic Quality: Eschar Tendon Exposed: No Muscle Exposed: No Joint Exposed: No Bone Exposed: No Periwound Skin Texture Texture Color No Abnormalities Noted: No No Abnormalities Noted: No Callus: No Atrophie Blanche: No Crepitus: No Cyanosis: No Excoriation: No Ecchymosis: No Induration: No Erythema: No Rash: No Hemosiderin Staining: No Scarring: No Mottled: No Pallor: No Moisture Rubor: No No Abnormalities Noted: No Dry / Scaly: No Temperature /  Pain Maceration: No Temperature: No Abnormality Tenderness on Palpation: Yes Wound Preparation Ulcer Cleansing: Rinsed/Irrigated with Saline Topical Anesthetic Applied: Other: lidocaine 4%, Treatment Notes Wound #1 (Head - Parietal) 1. Cleansed with: Clean wound with Normal Saline 2. Anesthetic Topical Lidocaine 4% cream to wound bed prior to debridement 4. Dressing Applied: Hydrogel Prisma Ag 5. Secondary [name] Signature(s) Signed: 03/04/2017 4:33:51 PM By: Montey Hora Entered By: Montey Hora on 03/04/2017 13:21:44 [name], [name] ([id number]) -------------------------------------------------------------------------------- Vitals Details Patient Name: [name] Date of Service: 03/04/2017 1:15 PM Medical Record Patient Account Number: [id number] Number: Treating RN: Montey Hora Date of Birth/Sex: 1934/03/08 (83 y.o. Male) Other Clinician: Scherry Ran Primary Care Aubra Pappalardo: Sheppard Coil Avalynn Bowe/Extender: Kristine Royal, Referring Nandana Krolikowski: Marlena Clipper in Treatment: 2 Vital Signs Time Taken: 13:20 Temperature (F): 98.3 Height (in): 69 Pulse (bpm): 65 Weight (lbs): 172 Respiratory Rate (breaths/min): 18 Body Mass Index (BMI): 25.4 Blood Pressure (mmHg): 127/78 Reference Range: 80 - 120 mg / dl Electronic Signature(s) Signed: 03/04/2017 4:33:51 PM By: Montey Hora Entered By: Montey Hora on 03/04/2017 13:20:40

## 2017-03-07 DIAGNOSIS — E1142 Type 2 diabetes mellitus with diabetic polyneuropathy: Secondary | ICD-10-CM | POA: Diagnosis not present

## 2017-03-07 DIAGNOSIS — I10 Essential (primary) hypertension: Secondary | ICD-10-CM | POA: Diagnosis not present

## 2017-03-07 DIAGNOSIS — S0100XA Unspecified open wound of scalp, initial encounter: Secondary | ICD-10-CM | POA: Diagnosis not present

## 2017-03-07 DIAGNOSIS — Z87891 Personal history of nicotine dependence: Secondary | ICD-10-CM | POA: Diagnosis not present

## 2017-03-07 DIAGNOSIS — Z8546 Personal history of malignant neoplasm of prostate: Secondary | ICD-10-CM | POA: Diagnosis not present

## 2017-03-07 DIAGNOSIS — Z9181 History of falling: Secondary | ICD-10-CM | POA: Diagnosis not present

## 2017-03-10 DIAGNOSIS — Z9181 History of falling: Secondary | ICD-10-CM | POA: Diagnosis not present

## 2017-03-10 DIAGNOSIS — S0100XA Unspecified open wound of scalp, initial encounter: Secondary | ICD-10-CM | POA: Diagnosis not present

## 2017-03-10 DIAGNOSIS — I10 Essential (primary) hypertension: Secondary | ICD-10-CM | POA: Diagnosis not present

## 2017-03-10 DIAGNOSIS — Z8546 Personal history of malignant neoplasm of prostate: Secondary | ICD-10-CM | POA: Diagnosis not present

## 2017-03-10 DIAGNOSIS — Z87891 Personal history of nicotine dependence: Secondary | ICD-10-CM | POA: Diagnosis not present

## 2017-03-10 DIAGNOSIS — E1142 Type 2 diabetes mellitus with diabetic polyneuropathy: Secondary | ICD-10-CM | POA: Diagnosis not present

## 2017-03-12 DIAGNOSIS — Z9181 History of falling: Secondary | ICD-10-CM | POA: Diagnosis not present

## 2017-03-12 DIAGNOSIS — Z8546 Personal history of malignant neoplasm of prostate: Secondary | ICD-10-CM | POA: Diagnosis not present

## 2017-03-12 DIAGNOSIS — Z87891 Personal history of nicotine dependence: Secondary | ICD-10-CM | POA: Diagnosis not present

## 2017-03-12 DIAGNOSIS — S0100XA Unspecified open wound of scalp, initial encounter: Secondary | ICD-10-CM | POA: Diagnosis not present

## 2017-03-12 DIAGNOSIS — I10 Essential (primary) hypertension: Secondary | ICD-10-CM | POA: Diagnosis not present

## 2017-03-12 DIAGNOSIS — E1142 Type 2 diabetes mellitus with diabetic polyneuropathy: Secondary | ICD-10-CM | POA: Diagnosis not present

## 2017-03-14 DIAGNOSIS — E1142 Type 2 diabetes mellitus with diabetic polyneuropathy: Secondary | ICD-10-CM | POA: Diagnosis not present

## 2017-03-14 DIAGNOSIS — Z87891 Personal history of nicotine dependence: Secondary | ICD-10-CM | POA: Diagnosis not present

## 2017-03-14 DIAGNOSIS — Z8546 Personal history of malignant neoplasm of prostate: Secondary | ICD-10-CM | POA: Diagnosis not present

## 2017-03-14 DIAGNOSIS — I10 Essential (primary) hypertension: Secondary | ICD-10-CM | POA: Diagnosis not present

## 2017-03-14 DIAGNOSIS — S0100XA Unspecified open wound of scalp, initial encounter: Secondary | ICD-10-CM | POA: Diagnosis not present

## 2017-03-14 DIAGNOSIS — Z9181 History of falling: Secondary | ICD-10-CM | POA: Diagnosis not present

## 2017-03-17 DIAGNOSIS — Z87891 Personal history of nicotine dependence: Secondary | ICD-10-CM | POA: Diagnosis not present

## 2017-03-17 DIAGNOSIS — Z8546 Personal history of malignant neoplasm of prostate: Secondary | ICD-10-CM | POA: Diagnosis not present

## 2017-03-17 DIAGNOSIS — E1142 Type 2 diabetes mellitus with diabetic polyneuropathy: Secondary | ICD-10-CM | POA: Diagnosis not present

## 2017-03-17 DIAGNOSIS — I10 Essential (primary) hypertension: Secondary | ICD-10-CM | POA: Diagnosis not present

## 2017-03-17 DIAGNOSIS — Z9181 History of falling: Secondary | ICD-10-CM | POA: Diagnosis not present

## 2017-03-17 DIAGNOSIS — S0100XA Unspecified open wound of scalp, initial encounter: Secondary | ICD-10-CM | POA: Diagnosis not present

## 2017-03-18 ENCOUNTER — Encounter: Payer: Medicare Other | Admitting: Internal Medicine

## 2017-03-18 DIAGNOSIS — S0101XA Laceration without foreign body of scalp, initial encounter: Secondary | ICD-10-CM | POA: Diagnosis not present

## 2017-03-18 DIAGNOSIS — E1140 Type 2 diabetes mellitus with diabetic neuropathy, unspecified: Secondary | ICD-10-CM | POA: Diagnosis not present

## 2017-03-18 DIAGNOSIS — S0101XD Laceration without foreign body of scalp, subsequent encounter: Secondary | ICD-10-CM | POA: Diagnosis not present

## 2017-03-18 DIAGNOSIS — Z8546 Personal history of malignant neoplasm of prostate: Secondary | ICD-10-CM | POA: Diagnosis not present

## 2017-03-18 DIAGNOSIS — I10 Essential (primary) hypertension: Secondary | ICD-10-CM | POA: Diagnosis not present

## 2017-03-18 DIAGNOSIS — Z87891 Personal history of nicotine dependence: Secondary | ICD-10-CM | POA: Diagnosis not present

## 2017-03-18 DIAGNOSIS — Z7984 Long term (current) use of oral hypoglycemic drugs: Secondary | ICD-10-CM | POA: Diagnosis not present

## 2017-03-20 NOTE — Progress Notes (Addendum)
ACHILLES, NEVILLE ([id number]) Visit Report for 03/18/2017 Arrival Information Details Patient Name: [name], [name] Date of Service: 03/18/2017 1:30 PM Medical Record Number: [id number] Patient Account Number: [id number] Date of Birth/Sex: [birth year] (83 y.o. Male) Treating RN: Montey Hora Primary Care [place]ale Nies: Nobie Putnam Other Clinician: Referring Cassell Voorhies: Nobie Putnam Treating Bertha Earwood/Extender: Tito Dine in Treatment: 4 Visit Information History Since Last Visit Added or deleted any medications: No Patient Arrived: Ambulatory Any new allergies or adverse reactions: No Arrival Time: 13:36 Had a fall or experienced change in No Accompanied By: self activities of daily living that may affect Transfer Assistance: None risk of falls: Patient Identification Verified: Yes Signs or symptoms of abuse/neglect since last visito No Secondary Verification Process Completed: Yes Hospitalized since last visit: No Patient Has Alerts: Yes Has Dressing in Place as Prescribed: Yes Patient Alerts: DM II Pain Present Now: No Electronic Signature(s) Signed: 03/18/2017 5:26:58 PM By: Montey Hora Entered By: Montey Hora on 03/18/2017 13:38:06 Polka, [name] ([id number]) -------------------------------------------------------------------------------- Clinic Level of Care Assessment Details Patient Name: Mare Loan Date of Service: 03/18/2017 1:30 PM Medical Record Number: [id number] Patient Account Number: [id number] Date of Birth/Sex: 1934-04-27 (83 y.o. Male) Treating RN: Montey Hora Primary Care Amandine Covino: Nobie Putnam Other Clinician: Referring Shanley Furlough: Nobie Putnam Treating Laurieanne Galloway/Extender: Tito Dine in Treatment: 4 Clinic Level of Care Assessment Items TOOL 4 Quantity Score []  - Use when only an EandM is performed on FOLLOW-UP visit 0 ASSESSMENTS - Nursing Assessment / Reassessment X -  Reassessment of Co-morbidities (includes updates in patient status) 1 10 X- 1 5 Reassessment of Adherence to Treatment Plan ASSESSMENTS - Wound and Skin Assessment / Reassessment X - Simple Wound Assessment / Reassessment - one wound 1 5 []  - 0 Complex Wound Assessment / Reassessment - multiple wounds []  - 0 Dermatologic / Skin Assessment (not related to wound area) ASSESSMENTS - Focused Assessment []  - Circumferential Edema Measurements - multi extremities 0 []  - 0 Nutritional Assessment / Counseling / Intervention []  - 0 Lower Extremity Assessment (monofilament, tuning fork, pulses) []  - 0 Peripheral Arterial Disease Assessment (using hand held doppler) ASSESSMENTS - Ostomy and/or Continence Assessment and Care []  - Incontinence Assessment and Management 0 []  - 0 Ostomy Care Assessment and Management (repouching, etc.) PROCESS - Coordination of Care X - Simple Patient / Family Education for ongoing care 1 15 []  - 0 Complex (extensive) Patient / Family Education for ongoing care []  - 0 Staff obtains Programmer, systems, Records, Test Results / Process Orders []  - 0 Staff telephones HHA, Nursing Homes / Clarify orders / etc []  - 0 Routine Transfer to another Facility (non-emergent condition) []  - 0 Routine Hospital Admission (non-emergent condition) []  - 0 New Admissions / Biomedical engineer / Ordering NPWT, Apligraf, etc. []  - 0 Emergency Hospital Admission (emergent condition) X- 1 10 Simple Discharge Coordination ESTEPHAN, GALLARDO F. ([id number]) []  - 0 Complex (extensive) Discharge Coordination PROCESS - Special Needs []  - Pediatric / Minor Patient Management 0 []  - 0 Isolation Patient Management []  - 0 Hearing / Language / Visual special needs []  - 0 Assessment of Community assistance (transportation, D/C planning, etc.) []  - 0 Additional assistance / Altered mentation []  - 0 Support Surface(s) Assessment (bed, cushion, seat, etc.) INTERVENTIONS - Wound Cleansing /  Measurement X - Simple Wound Cleansing - one wound 1 5 []  - 0 Complex Wound Cleansing - multiple wounds X- 1 5 Wound Imaging (photographs - any number of wounds) []  - 0 Wound Tracing (  instead of photographs) X- 1 5 Simple Wound Measurement - one wound []  - 0 Complex Wound Measurement - multiple wounds INTERVENTIONS - Wound Dressings []  - Small Wound Dressing one or multiple wounds 0 []  - 0 Medium Wound Dressing one or multiple wounds []  - 0 Large Wound Dressing one or multiple wounds []  - 0 Application of Medications - topical []  - 0 Application of Medications - injection INTERVENTIONS - Miscellaneous []  - External ear exam 0 []  - 0 Specimen Collection (cultures, biopsies, blood, body fluids, etc.) []  - 0 Specimen(s) / Culture(s) sent or taken to Lab for analysis []  - 0 Patient Transfer (multiple staff / Civil Service fast streamer / Similar devices) []  - 0 Simple Staple / Suture removal (25 or less) []  - 0 Complex Staple / Suture removal (26 or more) []  - 0 Hypo / Hyperglycemic Management (close monitor of Blood Glucose) []  - 0 Ankle / Brachial Index (ABI) - do not check if billed separately X- 1 5 Vital Signs Rowen, Jasean F. ([id number]) Has the patient been seen at the hospital within the last three years: Yes Total Score: 65 Level Of Care: New/Established - Level 2 Electronic Signature(s) Signed: 03/18/2017 5:26:58 PM By: Montey Hora Entered By: Montey Hora on 03/18/2017 16:59:49 Boutin, [name] ([id number]) -------------------------------------------------------------------------------- Encounter Discharge Information Details Patient Name: Mare Loan Date of Service: 03/18/2017 1:30 PM Medical Record Number: [id number] Patient Account Number: [id number] Date of Birth/Sex: Mar 24, 1934 (83 y.o. Male) Treating RN: Montey Hora Primary Care Mackensie Pilson: Nobie Putnam Other Clinician: Referring Kade Demicco: Nobie Putnam Treating Griffith Santilli/Extender:  Tito Dine in Treatment: 4 Encounter Discharge Information Items Discharge Pain Level: 0 Discharge Condition: Stable Ambulatory Status: Ambulatory Discharge Destination: Home Transportation: Private Auto Accompanied By: self Schedule Follow-up Appointment: No Medication Reconciliation completed and No provided to Patient/Care Keene Gilkey: Provided on Clinical Summary of Care: 03/18/2017 Form Type Recipient Paper Patient JC Electronic Signature(s) Signed: 03/18/2017 5:00:15 PM By: Montey Hora Previous Signature: 03/18/2017 4:39:05 PM Version By: Ruthine Dose Entered By: Montey Hora on 03/18/2017 17:00:15 [place]elstad, [name] ([id number]) -------------------------------------------------------------------------------- Multi Wound Chart Details Patient Name: Mare Loan Date of Service: 03/18/2017 1:30 PM Medical Record Number: [id number] Patient Account Number: [id number] Date of Birth/Sex: 1934/05/11 (83 y.o. Male) Treating RN: Montey Hora Primary Care Vander Kueker: Nobie Putnam Other Clinician: Referring Shron Ozer: Nobie Putnam Treating Estephany Perot/Extender: Tito Dine in Treatment: 4 Vital Signs Height(in): 69 Pulse(bpm): 63 Weight(lbs): 172 Blood Pressure(mmHg): 148/79 Body Mass Index(BMI): 25 Temperature(F): 98.3 Respiratory Rate 18 (breaths/min): Photos: [1:No Photos] [N/A:N/A] Wound Location: [1:Head - Parietal] [N/A:N/A] Wounding Event: [1:Trauma] [N/A:N/A] Primary Etiology: [1:Trauma, Other] [N/A:N/A] Date Acquired: [1:12/13/2016] [N/A:N/A] Weeks of Treatment: [1:4] [N/A:N/A] Wound Status: [1:Healed - Epithelialized] [N/A:N/A] Measurements L x W x D [1:0x0x0] [N/A:N/A] (cm) Area (cm) : [1:0] [N/A:N/A] Volume (cm) : [1:0] [N/A:N/A] % Reduction in Area: [1:100.00%] [N/A:N/A] % Reduction in Volume: [1:100.00%] [N/A:N/A] Classification: [1:Full Thickness Without Exposed Support Structures]  [N/A:N/A] Periwound Skin Texture: [1:No Abnormalities Noted] [N/A:N/A] Periwound Skin Moisture: [1:No Abnormalities Noted] [N/A:N/A] Periwound Skin Color: [1:No Abnormalities Noted No] [N/A:N/A N/A] Treatment Notes Electronic Signature(s) Signed: 03/18/2017 4:56:05 PM By: Linton Ham MD Entered By: Linton Ham on 03/18/2017 13:55:07 Waters, [name] ([id number]) -------------------------------------------------------------------------------- Multi-Disciplinary Care Plan Details Patient Name: Mare Loan Date of Service: 03/18/2017 1:30 PM Medical Record Number: [id number] Patient Account Number: [id number] Date of Birth/Sex: [birth year] (83 y.o. Male) Treating RN: Montey Hora Primary Care Ardelia Wrede: Nobie Putnam Other Clinician: Referring Tateanna Bach: Nobie Putnam Treating Fatih Stalvey/Extender: Ricard Dillon  Weeks in Treatment: 4 Active Inactive Electronic Signature(s) Signed: 03/18/2017 4:59:28 PM By: Montey Hora Entered By: Montey Hora on 03/18/2017 16:59:28 BLAYDEN, CONWELL ([id number]) -------------------------------------------------------------------------------- Pain Assessment Details Patient Name: Mare Loan Date of Service: 03/18/2017 1:30 PM Medical Record Number: [id number] Patient Account Number: [id number] Date of Birth/Sex: Oct 17, 1933 (83 y.o. Male) Treating RN: Montey Hora Primary Care Elene Downum: Nobie Putnam Other Clinician: Referring Melodee Lupe: Nobie Putnam Treating Jaki Hammerschmidt/Extender: Tito Dine in Treatment: 4 Active Problems Location of Pain Severity and Description of Pain Patient Has Paino No Site Locations Pain Management and Medication Current Pain Management: Notes Topical or injectable lidocaine is offered to patient for acute pain when surgical debridement is performed. If needed, Patient is instructed to use over the counter pain medication for the following 24-48  hours after debridement. Wound care MDs do not prescribed pain medications. Patient has chronic pain or uncontrolled pain. Patient has been instructed to make an appointment with their Primary Care Physician for pain management. Electronic Signature(s) Signed: 03/18/2017 5:26:58 PM By: Montey Hora Entered By: Montey Hora on 03/18/2017 13:38:14 Mare Loan ([id number]) -------------------------------------------------------------------------------- Patient/Caregiver Education Details Patient Name: Mare Loan Date of Service: 03/18/2017 1:30 PM Medical Record Number: [id number] Patient Account Number: [id number] Date of Birth/Gender: 04/29/1934 (83 y.o. Male) Treating RN: Montey Hora Primary Care Physician: Nobie Putnam Other Clinician: Referring Physician: Nobie Putnam Treating Physician/Extender: Tito Dine in Treatment: 4 Education Assessment Education Provided To: Patient Education Topics Provided Basic Hygiene: Handouts: Other: care of newly healed ulcer site Methods: Explain/Verbal Responses: State content correctly Electronic Signature(s) Signed: 03/18/2017 5:26:58 PM By: Montey Hora Entered By: Montey Hora on 03/18/2017 17:00:33 Mare Loan ([id number]) -------------------------------------------------------------------------------- Wound Assessment Details Patient Name: Mare Loan Date of Service: 03/18/2017 1:30 PM Medical Record Number: [id number] Patient Account Number: [id number] Date of Birth/Sex: [birth year] (83 y.o. Male) Treating RN: Montey Hora Primary Care Tamani Durney: Nobie Putnam Other Clinician: Referring Trany Chernick: Nobie Putnam Treating Markeis Allman/Extender: Tito Dine in Treatment: 4 Wound Status Wound Number: 1 Primary Etiology: Trauma, Other Wound Location: Head - Parietal Wound Status: Healed - Epithelialized Wounding Event: Trauma Date Acquired:  12/13/2016 Weeks Of Treatment: 4 Clustered Wound: No Photos Photo Uploaded By: Montey Hora on 03/18/2017 16:51:15 Wound Measurements Length: (cm) 0 Width: (cm) 0 Depth: (cm) 0 Area: (cm) 0 Volume: (cm) 0 % Reduction in Area: 100% % Reduction in Volume: 100% Wound Description Full Thickness Without Exposed Support Classification: Structures Periwound Skin Texture Texture Color No Abnormalities Noted: No No Abnormalities Noted: No Moisture No Abnormalities Noted: No Electronic Signature(s) Signed: 03/18/2017 5:26:58 PM By: Montey Hora Entered By: Montey Hora on 03/18/2017 13:51:55 Goldman, [name] ([id number]) -------------------------------------------------------------------------------- [place] Details Patient Name: Mare Loan Date of Service: 03/18/2017 1:30 PM Medical Record Number: [id number] Patient Account Number: [id number] Date of Birth/Sex: 23-Oct-1933 (83 y.o. Male) Treating RN: Montey Hora Primary Care Davina Howlett: Nobie Putnam Other Clinician: Referring Monserrate Blaschke: Nobie Putnam Treating Shamieka Gullo/Extender: Tito Dine in Treatment: 4 Vital Signs Time Taken: 13:38 Temperature (F): 98.3 Height (in): 69 Pulse (bpm): 63 Weight (lbs): 172 Respiratory Rate (breaths/min): 18 Body Mass Index (BMI): 25.4 Blood Pressure (mmHg): 148/79 Reference Range: 80 - 120 mg / dl Electronic Signature(s) Signed: 03/18/2017 5:26:58 PM By: Montey Hora Entered By: Montey Hora on 03/18/2017 13:38:33

## 2017-03-20 NOTE — Progress Notes (Signed)
[name], [name] ([id number]) Visit Report for 03/18/2017 HPI Details Patient Name: [name], [name] Date of Service: 03/18/2017 1:30 PM Medical Record Number: [id number] Patient Account Number: [id number] Date of Birth/Sex: 1933-09-19 (83 y.o. Male) Treating RN: Montey Hora Primary Care Provider: Nobie Putnam Other Clinician: Referring Provider: Nobie Putnam Treating Provider/Extender: Tito Dine in Treatment: 4 History of Present Illness HPI Description: 02/12/17; this is an 83 year old man who lives in an independent retirement home "[facility]" he apparently had a syncopal spell while working outside in the heat on 12/13/16 ultimately he suffered a laceration the scalp. I don't see that this was actually sutured. He saw his primary physician on 12/20/16 noted to have a superficial abrasion at 2-3 cm and he was recommended topical antibiotic cream. He saw his primary doctor again on 02/10/17 as the laceration was not healing and was prescribed Bactroban cream which she has been applying for the last 2 days. I think he is having some difficulty given the location of this wound and the fact that it is hair is macerated over the wound. His syncopal spell was felt to be secondary to dehydration. 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 Last hemoglobin is A1c was 7.1 which for an 83 year old is really quite reasonable control. 02/18/17; this is a patient readmitted to the clinic last week with a significant laceration over the occiput of the scalp. We've been using Hydrofera Blue on this and the dimensions of come down nicely. He has home health changing the dressing. 02/26/17; necrotic debris over the wound surface required debridement today. Otherwise surface looks good dimensions are down. Using [facility] 03/14/17; wound appears smaller and does not require debridement  using Hydrofera Blue although I might have thought this would close down by now 03/18/17; patient's wound is completely healed on his occiput. This was initially a fairly sizable laceration however it is completely epithelialized at this point. Electronic Signature(s) Signed: 03/18/2017 4:56:05 PM By: Linton Ham MD Entered By: Linton Ham on 03/18/2017 13:56:09 [name], [name] ([id number]) -------------------------------------------------------------------------------- Physical Exam Details Patient Name: [name] Date of Service: 03/18/2017 1:30 PM Medical Record Number: [id number] Patient Account Number: [id number] Date of Birth/Sex: 05-21-1934 (83 y.o. Male) Treating RN: Montey Hora Primary Care Provider: Nobie Putnam Other Clinician: Referring Provider: Nobie Putnam Treating Provider/Extender: Tito Dine in Treatment: 4 Constitutional Patient is hypertensive.. Pulse regular and within target range for patient.Marland Kitchen Respirations regular, non-labored and within target range.. Temperature is normal and within the target range for the patient.. Eyes Conjunctivae clear. No discharge. Respiratory Respiratory effort is easy and symmetric bilaterally. Rate is normal at rest and on room air.. Notes Would exam; the patient's occiput has healed. Completely epithelialized. Electronic Signature(s) Signed: 03/18/2017 4:56:05 PM By: Linton Ham MD Entered By: Linton Ham on 03/18/2017 13:58:22 [name], [name] ([id number]) -------------------------------------------------------------------------------- Physician Orders Details Patient Name: [name] Date of Service: 03/18/2017 1:30 PM Medical Record Number: [id number] Patient Account Number: [id number] Date of Birth/Sex: Dec 20, 1933 (83 y.o. Male) Treating RN: Montey Hora Primary Care Provider: Nobie Putnam Other Clinician: Referring Provider: Nobie Putnam Treating Provider/Extender: Tito Dine in Treatment: 4 Verbal / Phone Orders: No Diagnosis Coding Discharge From [facility] Services o Discharge from [place] Signature(s) Signed: 03/18/2017 4:56:05 PM By: Linton Ham MD Signed: 03/18/2017 5:26:58 PM By: Montey Hora Entered By: Montey Hora on 03/18/2017 13:52:09 [name], Bryan F. (  [id number]) -------------------------------------------------------------------------------- Problem List Details Patient Name: [name], [name] Date of Service: 03/18/2017 1:30 PM Medical Record Number: [id number] Patient Account Number: [id number] Date of Birth/Sex: [birth year] (83 y.o. Male) Treating RN: Montey Hora Primary Care Provider: Nobie Putnam Other Clinician: Referring Provider: Nobie Putnam Treating Provider/Extender: Tito Dine in Treatment: 4 Active Problems ICD-10 Encounter Code Description Active Date Diagnosis S01.01XD Laceration without foreign body of scalp, subsequent encounter 02/12/2017 Yes E11.40 Type 2 diabetes mellitus with diabetic neuropathy, unspecified 02/12/2017 Yes Inactive Problems Resolved Problems Electronic Signature(s) Signed: 03/18/2017 4:56:05 PM By: Linton Ham MD Entered By: Linton Ham on 03/18/2017 13:54:11 [name], [name] ([id number]) -------------------------------------------------------------------------------- Progress Note Details Patient Name: [name] Date of Service: 03/18/2017 1:30 PM Medical Record Number: [id number] Patient Account Number: [id number] Date of Birth/Sex: 05-Oct-1933 (83 y.o. Male) Treating RN: Montey Hora Primary Care Provider: Nobie Putnam Other Clinician: Referring Provider: Nobie Putnam Treating Provider/Extender: Tito Dine in Treatment: 4 Subjective History of Present Illness (HPI) 02/12/17; this is an 83 year old man who lives in an  independent retirement home "[facility]" he apparently had a syncopal spell while working outside in the heat on 12/13/16 ultimately he suffered a laceration the scalp. I don't see that this was actually sutured. He saw his primary physician on 12/20/16 noted to have a superficial abrasion at 2-3 cm and he was recommended topical antibiotic cream. He saw his primary doctor again on 02/10/17 as the laceration was not healing and was prescribed Bactroban cream which she has been applying for the last 2 days. I think he is having some difficulty given the location of this wound and the fact that it is hair is macerated over the wound. His syncopal spell was felt to be secondary to dehydration. 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 Last hemoglobin is A1c was 7.1 which for an 83 year old is really quite reasonable control. 02/18/17; this is a patient readmitted to the clinic last week with a significant laceration over the occiput of the scalp. We've been using Hydrofera Blue on this and the dimensions of come down nicely. He has home health changing the dressing. 02/26/17; necrotic debris over the wound surface required debridement today. Otherwise surface looks good dimensions are down. Using [facility] 03/14/17; wound appears smaller and does not require debridement using Hydrofera Blue although I might have thought this would close down by now 03/18/17; patient's wound is completely healed on his occiput. This was initially a fairly sizable laceration however it is completely epithelialized at this point. Objective Constitutional Patient is hypertensive.. Pulse regular and within target range for patient.Marland Kitchen Respirations regular, non-labored and within target range.. Temperature is normal and within the target range for the patient.. Vitals Time Taken: 1:38 PM, Height: 69 in, Weight: 172 lbs, BMI: 25.4,  Temperature: 98.3 F, Pulse: 63 bpm, Respiratory Rate: 18 breaths/min, Blood Pressure: 148/79 mmHg. Eyes Conjunctivae clear. No discharge. Respiratory Respiratory effort is easy and symmetric bilaterally. Rate is normal at rest and on room air.. General Notes: Would exam; the patient's occiput has healed. Completely epithelialized. [name], Bryan F. ([id number]) Integumentary (Hair, Skin) Wound #1 status is Healed - Epithelialized. Original cause of wound was Trauma. The wound is located on the Head - Parietal. The wound measures 0cm length x 0cm width x 0cm depth; 0cm^2 area and 0cm^3 volume. Assessment Active Problems ICD-10 S01.01XD - Laceration without foreign body of scalp, subsequent encounter E11.40 -  Type 2 diabetes mellitus with diabetic neuropathy, unspecified Plan Discharge From [facility] Services: Discharge from [place] #1 the patient was discharged from the wound care center today #2 traumatic wound that is fully epithelialized on the occiput of his scalp. #3 no secondary prevention required Electronic Signature(s) Signed: 03/18/2017 4:56:05 PM By: Linton Ham MD Entered By: Linton Ham on 03/18/2017 13:59:42 [name], [name] ([id number]) -------------------------------------------------------------------------------- SuperBill Details Patient Name: [name] Date of Service: 03/18/2017 Medical Record Number: [id number] Patient Account Number: [id number] Date of Birth/Sex: 05/24/1934 (83 y.o. Male) Treating RN: Montey Hora Primary Care Provider: Nobie Putnam Other Clinician: Referring Provider: Nobie Putnam Treating Provider/Extender: Tito Dine in Treatment: 4 Diagnosis Coding ICD-10 Codes Code Description S01.01XD Laceration without foreign body of scalp, subsequent encounter E11.40 Type 2 diabetes mellitus with diabetic neuropathy, unspecified Facility Procedures CPT4 Code: [id number] Description: [phone] -  WOUND CARE VISIT-LEV 2 EST PT Modifier: Quantity: 1 Physician Procedures CPT4 Code: 2248250 Description: 03704 - WC PHYS LEVEL 2 - EST PT ICD-10 Diagnosis Description S01.01XD Laceration without foreign body of scalp, subsequent enc Modifier: ounter Quantity: 1 Electronic Signature(s) Signed: 03/18/2017 4:59:57 PM By: Montey Hora Signed: 03/19/2017 5:28:55 PM By: Linton Ham MD Previous Signature: 03/18/2017 4:56:05 PM Version By: Linton Ham MD Entered By: Montey Hora on 03/18/2017 16:59:56

## 2017-03-24 ENCOUNTER — Ambulatory Visit (INDEPENDENT_AMBULATORY_CARE_PROVIDER_SITE_OTHER): Payer: Medicare Other | Admitting: Family Medicine

## 2017-03-24 ENCOUNTER — Encounter: Payer: Self-pay | Admitting: Family Medicine

## 2017-03-24 VITALS — BP 124/62 | HR 63 | Temp 98.0°F | Resp 16 | Ht 70.0 in | Wt 175.0 lb

## 2017-03-24 DIAGNOSIS — E1142 Type 2 diabetes mellitus with diabetic polyneuropathy: Secondary | ICD-10-CM

## 2017-03-24 DIAGNOSIS — M150 Primary generalized (osteo)arthritis: Secondary | ICD-10-CM

## 2017-03-24 DIAGNOSIS — N183 Chronic kidney disease, stage 3 (moderate): Secondary | ICD-10-CM

## 2017-03-24 DIAGNOSIS — I129 Hypertensive chronic kidney disease with stage 1 through stage 4 chronic kidney disease, or unspecified chronic kidney disease: Secondary | ICD-10-CM | POA: Diagnosis not present

## 2017-03-24 DIAGNOSIS — M159 Polyosteoarthritis, unspecified: Secondary | ICD-10-CM

## 2017-03-24 DIAGNOSIS — G8929 Other chronic pain: Secondary | ICD-10-CM | POA: Insufficient documentation

## 2017-03-24 DIAGNOSIS — M25512 Pain in left shoulder: Secondary | ICD-10-CM

## 2017-03-24 LAB — POCT GLYCOSYLATED HEMOGLOBIN (HGB A1C): HEMOGLOBIN A1C: 7.2 — AB (ref ?–5.6)

## 2017-03-24 MED ORDER — BACLOFEN 10 MG PO TABS: 5.0000 mg | ORAL_TABLET | Freq: Three times a day (TID) | ORAL | 2 refills | Status: DC | PRN

## 2017-03-24 NOTE — Assessment & Plan Note (Signed)
Likely underlying etiology affecting L shoulder and other joints Clinical dx without confirmed imaging for OA/DJD See A&P

## 2017-03-24 NOTE — Progress Notes (Signed)
Subjective:    Patient ID: [name], male    DOB: 04-11-1934, 83 y.o.   MRN: [id number]  [name] is a 83 y.o. male presenting on 03/24/2017 for Diabetes   HPI   CHRONIC DM, Type 2 with DM Neuropathy: Reports no concerns. CBGs: Checks fasting AM CBG 1-3 x weekly - avg 120s, had high 140, and low >100 Meds: Metformin XR 500mg  daily Reports good compliance. Tolerating well w/o side-effects Currently on ACEi Lifestyle: - Diet (Tries to watch and improve DM diet, limits bread, eats meal at retirement community, increase water intake instead of tea) - Exercise (some walking, and 3 x weekly at community with light weights and exercise program) - Last Eye Exam 02/2017, Dr Ellin Mayhew, awaiting paperwork - For neuropathy, still taking Gabapentin 1200mg  TID, slight improvement with burning and numbness Denies hypoglycemia, polyuria, visual changes  CHRONIC HTN with Nephropathy CKD-III Reports that his BP was checked at home by [facility] nurse recently, reportedly normal. Otherwise he does not have a BP cuff to check on his own. Current Meds - Lisinopril 10mg  - Prior AKI was holding ACEi now back on Reports good compliance, took meds today. Tolerating well, w/o complaints. - he is taking some Advil and ibuprofen occasionally due to back and shoulder pain Denies CP, dyspnea, HA, edema  Left Shoulder Pain, chronic, presumed bursitis / History of osteoarthritis multiple joints - Reports chronic history of some joint pain, mostly mild intermittent flares. He is followed by Chiropractor Dr Olen Cordial about every other week, works on spine and neck. Has good results overall. - More recently complaining of R mid back pain in muscles feels like a "catch" at times, and thinks it is the muscle, no known injury. Also worsening Left shoulder discomfort and stiffness, was told "arthritis" by the chiropractor, unsure if any recent x-rays, none available on chart. He has been taking occasional NSAID with  ibuprofen, but knows he shouldn't due to kidneys - Has been on muscle relaxant in past, would like to try again - He has had prior steroid injection in knee but not shoulder  SCALP Wound - Resolved - He was recently discharged from [facility] after wound healed and he completed [facility] wound care nursing as well.  Health Maintenance: - UTD Flu Shot 03/11/17 - UTD Pnuemonia Vaccine  Depression screen [facility] 2/9 03/24/2017 10/22/2016 10/22/2016  Decreased Interest 0 0 0  Down, Depressed, Hopeless 0 0 0  PHQ - 2 Score 0 0 0  Altered sleeping - 0 -  Tired, decreased energy - 0 -  Change in appetite - 0 -  Feeling bad or failure about yourself  - 0 -  Trouble concentrating - 0 -  Moving slowly or fidgety/restless - 0 -  Suicidal thoughts - 0 -  PHQ-9 Score - 0 -    Social History  Substance Use Topics  . Smoking status: Former Smoker    Quit date: 05/27/1978  . Smokeless tobacco: Never Used  . Alcohol use No    Review of Systems Per HPI unless specifically indicated above     Objective:    BP 124/62   Pulse 63   Temp 98 F (36.7 C) (Oral)   Resp 16   Ht 5\' 10"  (1.778 m)   Wt 175 lb (79.4 kg)   BMI 25.11 kg/m   Wt Readings from Last 3 Encounters:  03/24/17 175 lb (79.4 kg)  02/10/17 171 lb (77.6 kg)  12/20/16 168 lb 6.4  oz (76.4 kg)    Physical Exam  Constitutional: He is oriented to person, place, and time. He appears well-developed and well-nourished. No distress.  Well-appearing, comfortable, cooperative  HENT:  Head: Normocephalic and atraumatic.  Mouth/Throat: Oropharynx is clear and moist.  Eyes: Conjunctivae are normal. Right eye exhibits no discharge. Left eye exhibits no discharge.  Neck: Normal range of motion. Neck supple. No thyromegaly present.  Cardiovascular: Normal rate, regular rhythm, normal heart sounds and intact distal pulses.   No murmur heard. Pulmonary/Chest: Effort normal and breath sounds normal. No respiratory distress. He has no  wheezes. He has no rales.  Musculoskeletal: He exhibits no edema.  Bilateral Shoulder Inspection: Normal appearance bilateral symmetrical Palpation: Non-tender to palpation over anterior, lateral, or posterior shoulder  - Some mild tenderness and muscle hypertonicity Right upper thoracic back muscles below scapula ROM: - Right shoulder mostly full intact active ROM forward flexion, abduction, internal / external rotation - Left shoulder some slight reduced forward flexion and abduction ROM, can get above shoulder, limited internal rotation behind back Special Testing: Rotator cuff testing negative for weakness with supraspinatus full can and empty can test but some mild discomfort and pain L shoulder on testing - L shoulder Hawkin's AC impingement positive for pain Strength: Normal strength 5/5 flex/ext, ext rot / int rot, grip, rotator cuff str testing. Neurovascular: Distally intact pulses, sensation to light touch  Lymphadenopathy:    He has no cervical adenopathy.  Neurological: He is alert and oriented to person, place, and time.  Skin: Skin is warm and dry. No rash noted. He is not diaphoretic. No erythema.  Posterior scalp wound well healed, no more skin breakdown or ulceration. Only very minimal slight discoloration, mostly resolved  Psychiatric: He has a normal mood and affect. His behavior is normal.  Well groomed, good eye contact, normal speech and thoughts  Nursing note and vitals reviewed.    Recent Labs  09/16/16 0920 12/20/16 2200 03/24/17 0947  HGBA1C 6.6 7.1* 7.2*    Results for orders placed or performed in visit on 03/24/17  POCT HgB A1C  Result Value Ref Range   Hemoglobin A1C 7.2 (A) 5.6      Assessment & Plan:   Problem List Items Addressed This Visit    Benign hypertension with CKD (chronic kidney disease) stage III ([place])    Well controlled HTN back on ACEi No home readings Complication: CKD-III (resolved AKI since 11/2016, now Cr back to baseline  1.2)  Plan: 1. CONTINUE Lisinopril 10mg  daily 2. Improve hydration, avoid NSAIDs, lifestyle regular exercise, low sodium 3. Monitor BP outside office if he can occasionally 4. Follow-up 3 months      Chronic left shoulder pain    Consistent with subacute to chronic L-shoulder bursitis with some reduced active ROM but without significant evidence of muscle tear (no weakness). - No new injury etiology, Clinically presumed underlying OA/DJD older patient age 27 likely underlying arthritis - No imaging on chart  Plan: 1. Reassurance, may continue with Chiropractor Dr Olen Cordial 2. STOP Ibuprofen due to CKD - he was advised to take this by chiropractor 3. Start max dose Tylenol Ext Str 500mg  x 2 tabs TID regularly 4. Start new muscle relaxant Baclofen 5-10mg  TID PRN caution sedation, may help rest and sleep 5. Relative rest but keep shoulder mobile, demonstrated ROM exercises, avoid heavy lifting 6. May try heating pad PRN 7. Return to clinic within 3 months sooner if not improved, re-evaluation, if not improved consider subacromial steroid  inj, X-rays eval for arthritis      Relevant Medications   baclofen (LIORESAL) 10 MG tablet   Primary osteoarthritis involving multiple joints    Likely underlying etiology affecting L shoulder and other joints Clinical dx without confirmed imaging for OA/DJD See A&P      Relevant Medications   baclofen (LIORESAL) 10 MG tablet   Type 2 diabetes mellitus with diabetic polyneuropathy ([place]) - Primary    Well-controlled DM with A1c 7.2 (stable from 7.1) - overall slightly elevated from prior 6.6 Complications - CKD-III, peripheral neuropathy  Plan:  1. Continue current therapy - Metformin XR 500mg  2. Encourage improved lifestyle - low carb, low sugar diet, reduce portion size, continue improving regular exercise 3. Continue ACEi 4. Continue Gabapentin 1200 TID for now, still consider future Lyrica 5. Will check for record from DM Eye Dr Ellin Mayhew, he  went 02/2017 6. Follow-up 3 months DM A1c      Relevant Orders   POCT HgB A1C (Completed)      Meds ordered this encounter  Medications  . baclofen (LIORESAL) 10 MG tablet    Sig: Take 0.5-1 tablets (5-10 mg total) by mouth 3 (three) times daily as needed for muscle spasms.    Dispense:  30 each    Refill:  2     Follow up plan: Return in about 3 months (around 06/24/2017) for DM A1c, L shoulder bursitis, back pain.  Nobie Putnam, [facility] 03/24/2017, 1:31 PM

## 2017-03-24 NOTE — Assessment & Plan Note (Signed)
Well controlled HTN back on ACEi No home readings Complication: CKD-III (resolved AKI since 11/2016, now Cr back to baseline 1.2)  Plan: 1. CONTINUE Lisinopril 10mg  daily 2. Improve hydration, avoid NSAIDs, lifestyle regular exercise, low sodium 3. Monitor BP outside office if he can occasionally 4. Follow-up 3 months

## 2017-03-24 NOTE — Assessment & Plan Note (Signed)
Well-controlled DM with A1c 7.2 (stable from 7.1) - overall slightly elevated from prior 6.6 Complications - CKD-III, peripheral neuropathy  Plan:  1. Continue current therapy - Metformin XR 500mg  2. Encourage improved lifestyle - low carb, low sugar diet, reduce portion size, continue improving regular exercise 3. Continue ACEi 4. Continue Gabapentin 1200 TID for now, still consider future Lyrica 5. Will check for record from DM Eye Dr Ellin Mayhew, he went 02/2017 6. Follow-up 3 months DM A1c

## 2017-03-24 NOTE — Patient Instructions (Addendum)
Thank you for coming to the clinic today.  1. I think you have Left shoulder bursitis, possibly underlying arthritis  Recommend to start taking Tylenol Extra Strength 500mg  tabs - take 1 to 2 tabs per dose (max 1000mg ) every 6-8 hours for pain (take regularly, don't skip a dose for next 7 days), max 24 hour daily dose is 6 tablets or 3000mg . In the future you can repeat the same everyday Tylenol course for 1-2 weeks at a time.  - Stop Ibuprofen, Advil  2.  Start taking Baclofen (Lioresal) 10mg  (muscle relaxant) - start with half (cut) to one whole pill at night as needed for next 1-3 nights (may make you drowsy, caution with driving) see how it affects you, then if tolerated increase to one pill 2 to 3 times a day or (every 8 hours as needed)  3. A1c stable 7.2  Please schedule a Follow-up Appointment to: Return in about 3 months (around 06/24/2017) for DM A1c, L shoulder bursitis, back pain.  If you have any other questions or concerns, please feel free to call the clinic or send a message through [place]. You may also schedule an earlier appointment if necessary.  Additionally, you may be receiving a survey about your experience at our clinic within a few days to 1 week by e-mail or mail. We value your feedback.  Nobie Putnam, DO [facility], [facility]  Range of Motion Shoulder Exercises  [place] with your good arm against a counter or table for support [facility] forward with a wide stance (make sure your body is comfortable) - Your painful shoulder should hang down and feel "heavy" - Gently move your painful arm in small circles "clockwise" for several turns - Switch to "counterclockwise" for several turns - Early on keep circles narrow and move slowly - Later in rehab, move in larger circles and faster movement   Wall Crawl - Stand close (about 1-2 ft away) to a wall, facing it directly - Reach out with your arm of painful shoulder and place  fingers (not palm) on wall - You should make contact with wall at your waist level - Slowly walk your fingers up the wall. Stay in contact with wall entire time, do not remove fingers - Keep walking fingers up wall until you reach shoulder level - You may feel tightening or mild discomfort, once you reach a height that causes pain or if you are already above your shoulder height then stop. Repeat from starting position. - Early on stand closer to wall, move fingers slowly, and stay at or below shoulder level - Later in rehab, stand farther away from wall (fingertips), move fingers quicker, go above shoulder level

## 2017-03-24 NOTE — Assessment & Plan Note (Signed)
Consistent with subacute to chronic L-shoulder bursitis with some reduced active ROM but without significant evidence of muscle tear (no weakness). - No new injury etiology, Clinically presumed underlying OA/DJD older patient age 83 likely underlying arthritis - No imaging on chart  Plan: 1. Reassurance, may continue with Chiropractor Dr Olen Cordial 2. STOP Ibuprofen due to CKD - he was advised to take this by chiropractor 3. Start max dose Tylenol Ext Str 500mg  x 2 tabs TID regularly 4. Start new muscle relaxant Baclofen 5-10mg  TID PRN caution sedation, may help rest and sleep 5. Relative rest but keep shoulder mobile, demonstrated ROM exercises, avoid heavy lifting 6. May try heating pad PRN 7. Return to clinic within 3 months sooner if not improved, re-evaluation, if not improved consider subacromial steroid inj, X-rays eval for arthritis

## 2017-03-25 ENCOUNTER — Other Ambulatory Visit: Payer: Self-pay | Admitting: Family Medicine

## 2017-03-25 DIAGNOSIS — Z23 Encounter for immunization: Secondary | ICD-10-CM | POA: Diagnosis not present

## 2017-06-24 ENCOUNTER — Ambulatory Visit (INDEPENDENT_AMBULATORY_CARE_PROVIDER_SITE_OTHER): Payer: Medicare Other | Admitting: Family Medicine

## 2017-06-24 ENCOUNTER — Other Ambulatory Visit: Payer: Self-pay | Admitting: Family Medicine

## 2017-06-24 ENCOUNTER — Encounter: Payer: Self-pay | Admitting: Family Medicine

## 2017-06-24 VITALS — BP 110/56 | HR 62 | Temp 98.0°F | Resp 16 | Ht 70.0 in | Wt 174.0 lb

## 2017-06-24 DIAGNOSIS — G63 Polyneuropathy in diseases classified elsewhere: Secondary | ICD-10-CM

## 2017-06-24 DIAGNOSIS — E1142 Type 2 diabetes mellitus with diabetic polyneuropathy: Secondary | ICD-10-CM

## 2017-06-24 DIAGNOSIS — M150 Primary generalized (osteo)arthritis: Secondary | ICD-10-CM | POA: Diagnosis not present

## 2017-06-24 DIAGNOSIS — I129 Hypertensive chronic kidney disease with stage 1 through stage 4 chronic kidney disease, or unspecified chronic kidney disease: Secondary | ICD-10-CM

## 2017-06-24 DIAGNOSIS — E785 Hyperlipidemia, unspecified: Secondary | ICD-10-CM

## 2017-06-24 DIAGNOSIS — N183 Chronic kidney disease, stage 3 unspecified: Secondary | ICD-10-CM

## 2017-06-24 DIAGNOSIS — M159 Polyosteoarthritis, unspecified: Secondary | ICD-10-CM

## 2017-06-24 DIAGNOSIS — Z8546 Personal history of malignant neoplasm of prostate: Secondary | ICD-10-CM

## 2017-06-24 DIAGNOSIS — G8929 Other chronic pain: Secondary | ICD-10-CM

## 2017-06-24 DIAGNOSIS — M25512 Pain in left shoulder: Secondary | ICD-10-CM

## 2017-06-24 DIAGNOSIS — E1169 Type 2 diabetes mellitus with other specified complication: Secondary | ICD-10-CM

## 2017-06-24 NOTE — Assessment & Plan Note (Signed)
Stable chronic problem bilateral lower extremity feet neuropathy Improved on Gabapentin 1200mg  TID - x 2 of 600mg  Goal to control DM May take muscle relaxant Baclofen at PM could help Future consider switch med to Lyrica, would only proceed with caution if uncontrolled otherwise risk side effects

## 2017-06-24 NOTE — Assessment & Plan Note (Signed)
Likely cause of underlying L shoulder bursitis and possibly with R back pain Now improved Stable chronic condition Avoid NSAIDs due to CKD Continue Tylenol Re-ordered Baclofen

## 2017-06-24 NOTE — Progress Notes (Signed)
Subjective:    Patient ID: [name], male    DOB: 01-10-1934, 84 y.o.   MRN: [id number]  [name] is a 84 y.o. male presenting on 06/24/2017 for Diabetes   HPI   CHRONIC DM, Type 2 with DM Neuropathy: Reports he has done well since last visit, see lifestyle below. He has not checked CBG as regularly but thinks his sugar is controlled. CBGs: Checks fasting AM CBG 1-3 x weekly - recent readings have been similar to last time - avg 120s, had high 140, and low >100 - now not checking as much. Meds: Metformin XR 546m daily Reports good compliance. Tolerating well w/o side-effects Currently on ACEi Lifestyle: - Diet (Mostly stable, still eats regular meal at retirement community, now he has had some dessert more occasionally, in past was declining dessert. Limits breads and starches, eats some whole grain breads) - Exercise (Occasional walking and treadmill / stair stepper, and 3 x weekly at community with light weights and exercise program - he even helped lead the exercise program recently. Also some Tai Chi) - Last Eye Exam 02/2017, Dr WEllin Mayhew- For neuropathy, still taking Gabapentin 12053mTID - seems to control his symptoms, without side effects, still has some refractory symptoms but better than off med Denies hypoglycemia, polyuria, visual changes  FOLLOW-UP Left Shoulder Pain/Bursitis / R Upper Back Pain / History of osteoarthritis multiple joints - Last visit with me 03/24/17, for initial visit for same problem, treated with Baclofen muscle relaxant and shoulder ROM rehab recommendations, see prior notes for background information. - Interval update with notable improvement in L shoulder pain and ROM - Today patient reports he is unable to recall if he filled or took the Baclofen rx, but he states L shoulder now much improved mostly with activity exercise and stretching. He takes Tylenol PRN but no NSAIDs - Also followed by Chiropractor Dr NoOlen Cordialn interval some  improvement, however also complains of a R upper back rib discomfort sometimes with rotation to Left and on deeper breathing, was told maybe rib out of place or nerve injury, he denies any trauma, fall or other injury   [facility] 06/24/2017 03/24/2017 10/22/2016  Decreased Interest 0 0 0  Down, Depressed, Hopeless 0 0 0  PHQ - 2 Score 0 0 0  Altered sleeping - - 0  Tired, decreased energy - - 0  Change in appetite - - 0  Feeling bad or failure about yourself  - - 0  Trouble concentrating - - 0  Moving slowly or fidgety/restless - - 0  Suicidal thoughts - - 0  PHQ-9 Score - - 0    Social History   Tobacco Use  . Smoking status: Former Smoker    Last attempt to quit: 05/27/1978    Years since quitting: 39.1  . Smokeless tobacco: Never Used  Substance Use Topics  . Alcohol use: No    Alcohol/week: 0.0 oz  . Drug use: No    Review of Systems Per HPI unless specifically indicated above     Objective:    BP (!) 110/56   Pulse 62   Temp 98 F (36.7 C) (Oral)   Resp 16   Ht '5\' 10"'  (1.778 m)   Wt 174 lb (78.9 kg)   BMI 24.97 kg/m   Wt Readings from Last 3 Encounters:  06/24/17 174 lb (78.9 kg)  03/24/17 175 lb (79.4 kg)  02/10/17 171 lb (77.6 kg)    Physical  Exam  Constitutional: He is oriented to person, place, and time. He appears well-developed and well-nourished. No distress.  Well-appearing, comfortable, cooperative  HENT:  Head: Normocephalic and atraumatic.  Mouth/Throat: Oropharynx is clear and moist.  Eyes: Conjunctivae are normal. Right eye exhibits no discharge. Left eye exhibits no discharge.  Neck: Normal range of motion. Neck supple. No thyromegaly present.  Cardiovascular: Normal rate, regular rhythm, normal heart sounds and intact distal pulses.  No murmur heard. Pulmonary/Chest: Effort normal and breath sounds normal. No respiratory distress. He has no wheezes. He has no rales.  Musculoskeletal: He exhibits no edema.  Left  Shoulder Inspection: Normal appearance bilateral symmetrical Palpation: Non-tender to palpation over anterior, lateral, or posterior shoulder ROM: - Left shoulder improved forward flexion / abduction above shoulder level. Still some mild limited internal rotation behind back Special Testing: Rotator cuff testing negative for weakness with supraspinatus full can and empty can test but some mild discomfort and pain L shoulder on testing - Improved now minimal to no pain with impingement testing Strength: Normal strength 5/5 flex/ext, ext rot / int rot, grip, rotator cuff str testing. Neurovascular: Distally intact pulses, sensation to light touch  Right Thoracic / Back R upper to mid thoracic mid ribcage with localized area of slight muscle hypertonicity without tenderness, no significant displacement of rib palpated, good movement on breathing and rotation.  Lymphadenopathy:    He has no cervical adenopathy.  Neurological: He is alert and oriented to person, place, and time.  Skin: Skin is warm and dry. No rash noted. He is not diaphoretic. No erythema.  Psychiatric: He has a normal mood and affect. His behavior is normal.  Well groomed, good eye contact, normal speech and thoughts  Nursing note and vitals reviewed.    Recent Labs    09/16/16 0920 12/20/16 2200 03/24/17 0947  HGBA1C 6.6 7.1* 7.2*    Results for orders placed or performed in visit on 03/25/17  HM DIABETES EYE EXAM  Result Value Ref Range   HM Diabetic Eye Exam No Retinopathy No Retinopathy      Assessment & Plan:   Problem List Items Addressed This Visit    Chronic left shoulder pain    Significantly improved Now more normal ROM Unsure if took baclofen Advised to check with pharmacy may fill Baclofen take PRN also for his R back Continue Tylenol, regular exercise, stretching Follow-up as needed      Peripheral neuropathy    Stable chronic problem bilateral lower extremity feet neuropathy Improved on  Gabapentin 1232m TID - x 2 of 6071mGoal to control DM May take muscle relaxant Baclofen at PM could help Future consider switch med to Lyrica, would only proceed with caution if uncontrolled otherwise risk side effects      Primary osteoarthritis involving multiple joints    Likely cause of underlying L shoulder bursitis and possibly with R back pain Now improved Stable chronic condition Avoid NSAIDs due to CKD Continue Tylenol Re-ordered Baclofen      Type 2 diabetes mellitus with diabetic polyneuropathy ([place]- Primary    Concerned now with dramatic change of A1c - initial POC A1c 9.0, compared to last A1cs 7.1 to 7.2, despite limited lifestyle or med changes. Suspect may be inaccurate POC reading, will check serum A1c today - In past well controlled [id number] - CKD-III, peripheral neuropathy  Plan:  1. Discontinue POC A1c result - will check serum lab A1c today instead - Continue current therapy - Metformin XR 50061m  2. Encourage improved lifestyle - low carb, low sugar diet, reduce portion size, continue improving regular exercise 3. Continue ACEi 4. Continue Gabapentin 1200 TID for now, still consider future Lyrica - advised caution with new med more risk side effect 5. F/u with Dr Phoebe Sharps in 2019 6. Follow-up 3 to 6 months - based on next A1c serum result - if abnormal >9 may actually return sooner for 3 month visit and advised to DOUBLE Metformin dose XR 542m x 2 tabs daily instead, otherwise if actually was inaccurate reading then can schedule as planned for annual in 11/2017      Relevant Orders   Hemoglobin A1c      No orders of the defined types were placed in this encounter.  Follow up plan: Return in about 6 months (around 12/22/2017) for Annual Physical.  Future labs 6 months - Annual Phys  ANobie Putnam [facility] 06/24/2017, 9:15 AM

## 2017-06-24 NOTE — Assessment & Plan Note (Signed)
Concerned now with dramatic change of A1c - initial POC A1c 9.0, compared to last A1cs 7.1 to 7.2, despite limited lifestyle or med changes. Suspect may be inaccurate POC reading, will check serum A1c today - In past well controlled N6E Complications - CKD-III, peripheral neuropathy  Plan:  1. Discontinue POC A1c result - will check serum lab A1c today instead - Continue current therapy - Metformin XR 500mg  2. Encourage improved lifestyle - low carb, low sugar diet, reduce portion size, continue improving regular exercise 3. Continue ACEi 4. Continue Gabapentin 1200 TID for now, still consider future Lyrica - advised caution with new med more risk side effect 5. F/u with Dr Phoebe Sharps in 2019 6. Follow-up 3 to 6 months - based on next A1c serum result - if abnormal >9 may actually return sooner for 3 month visit and advised to DOUBLE Metformin dose XR 500mg  x 2 tabs daily instead, otherwise if actually was inaccurate reading then can schedule as planned for annual in 11/2017

## 2017-06-24 NOTE — Assessment & Plan Note (Signed)
Significantly improved Now more normal ROM Unsure if took baclofen Advised to check with pharmacy may fill Baclofen take PRN also for his R back Continue Tylenol, regular exercise, stretching Follow-up as needed

## 2017-06-24 NOTE — Patient Instructions (Addendum)
Thank you for coming to the office today.  1. Initial fingerstick sugar reading 9.0, this seems elevated, we will check vein draw today as well and contact you to see if accurate  If sugar is still elevated, then I would recommend DOUBLE dose Metformin XR 500mg  - take TWO pills at once going forward if we notify you.  We may need to see you back in 3 months instead  2. For back soreness most likely a rib and muscle, also for your shoulder you can use a Baclofen muscle relaxant as previously prescribed  Start taking Baclofen (Lioresal) 10mg  (muscle relaxant) - start with half (cut) to one whole pill at night as needed for next 1-3 nights (may make you drowsy, caution with driving) see how it affects you, then if tolerated increase to one pill 2 to 3 times a day or (every 8 hours as needed)  Continue Gabapentin x 2 pills, 3 times a day  DUE for FASTING BLOOD WORK (no food or drink after midnight before the lab appointment, only water or coffee without cream/sugar on the morning of)  SCHEDULE "Lab Only" visit in the morning at the clinic for lab draw in 6 MONTHS   - Make sure Lab Only appointment is at about 1 week before your next appointment, so that results will be available  For Lab Results, once available within 2-3 days of blood draw, you can can log in to MyChart online to view your results and a brief explanation. Also, we can discuss results at next follow-up visit.   Please schedule a Follow-up Appointment to: Return in about 6 months (around 12/22/2017) for Annual Physical.  If you have any other questions or concerns, please feel free to call the office or send a message through [place]. You may also schedule an earlier appointment if necessary.  Additionally, you may be receiving a survey about your experience at our office within a few days to 1 week by e-mail or mail. We value your feedback.  Nobie Putnam, DO [place]

## 2017-06-25 LAB — HEMOGLOBIN A1C
Hgb A1c MFr Bld: 9.3 % of total Hgb — ABNORMAL HIGH (ref <5.7)
Mean Plasma Glucose: 220 (calc)
eAG (mmol/L): 12.2 (calc)

## 2017-07-03 ENCOUNTER — Other Ambulatory Visit: Payer: Self-pay | Admitting: Family Medicine

## 2017-07-03 DIAGNOSIS — E1142 Type 2 diabetes mellitus with diabetic polyneuropathy: Secondary | ICD-10-CM

## 2017-07-03 MED ORDER — METFORMIN HCL ER 500 MG PO TB24: 1000.0000 mg | ORAL_TABLET | Freq: Every day | ORAL | 3 refills | Status: DC

## 2017-10-06 IMAGING — CT CT HEAD W/O CM
3 series · 15 of 46 positions shown, 18 images · non-contrast
Comparison: None.

CLINICAL DATA: Syncopal episodes after working outside in the he.
Struck head.

EXAM:
CT HEAD WITHOUT CONTRAST
TECHNIQUE: Contiguous axial images were obtained from the base of the skull
through the vertex without intravenous contrast.

[Series 2: head wo · axial · 0.44mm/px · z∈[-88,+32]mm · 9 of 29 slices shown, 12 images]
[im 3/29  brain]
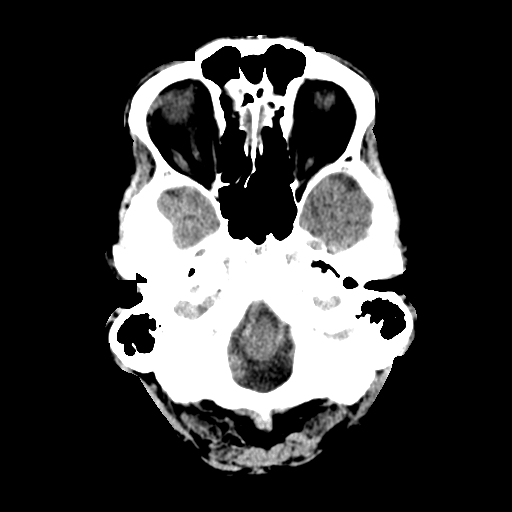
[im 3/29  bone]
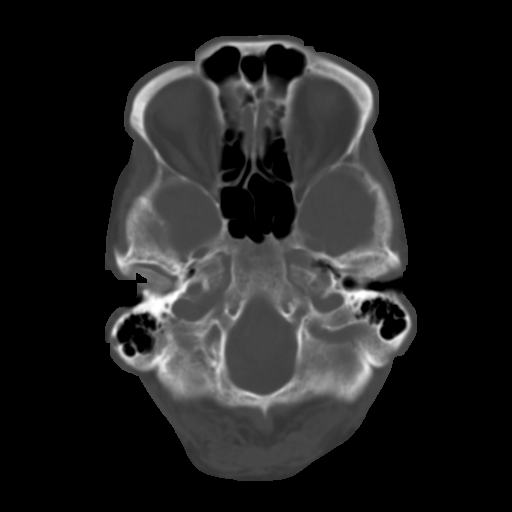
[im 6/29  brain]
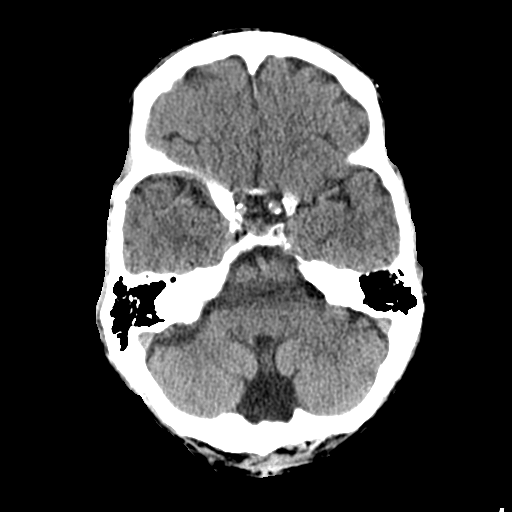
[im 9/29  brain]
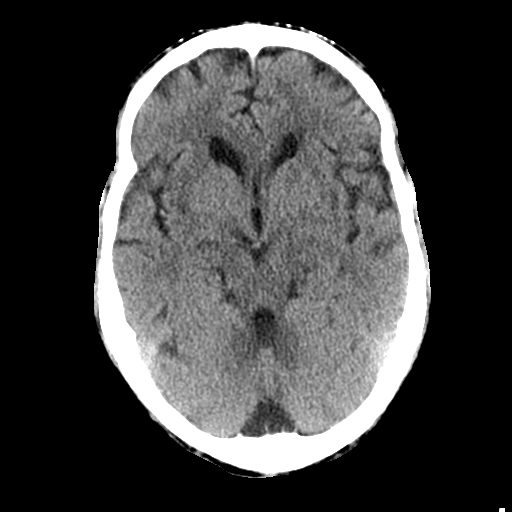
[im 12/29  brain]
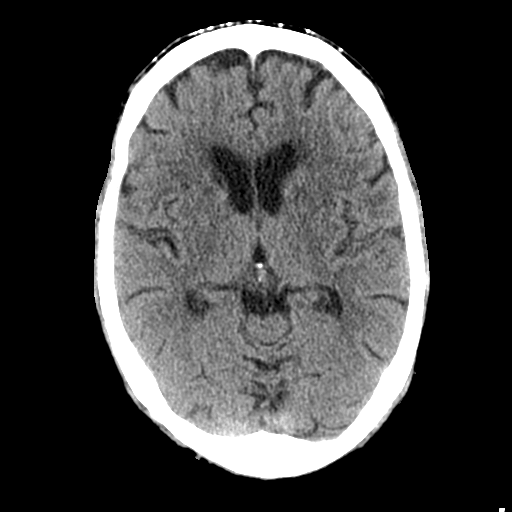
[im 15/29  brain]
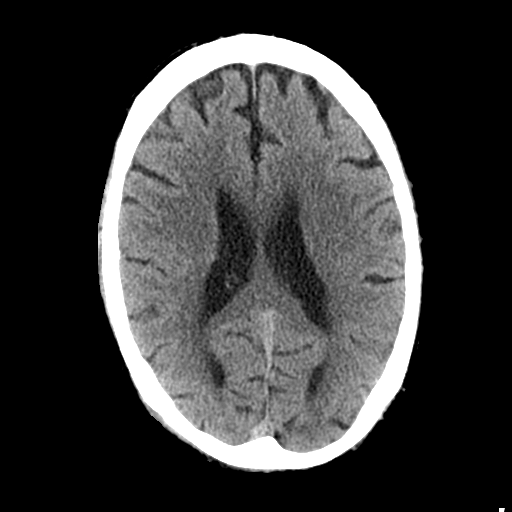
[im 15/29  bone]
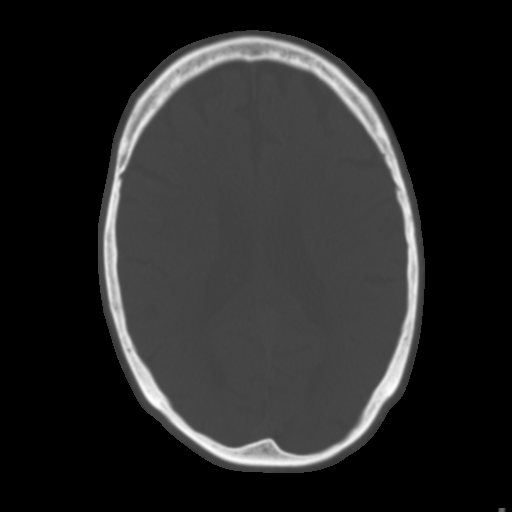
[im 18/29  brain]
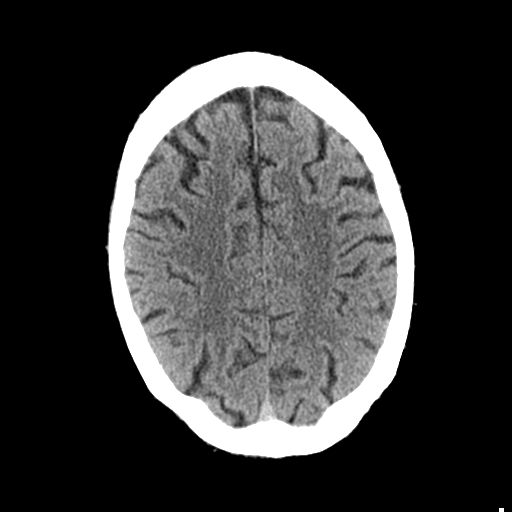
[im 21/29  brain]
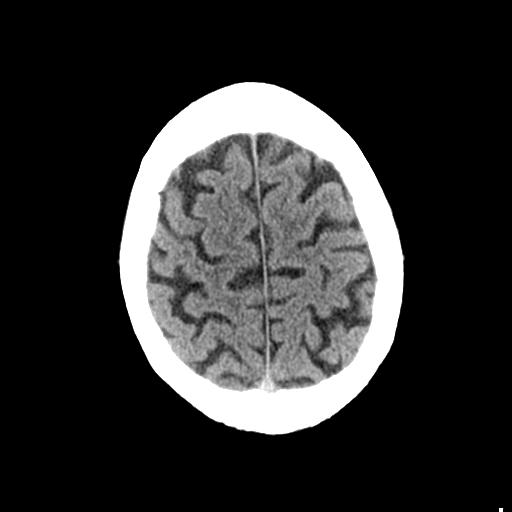
[im 24/29  brain]
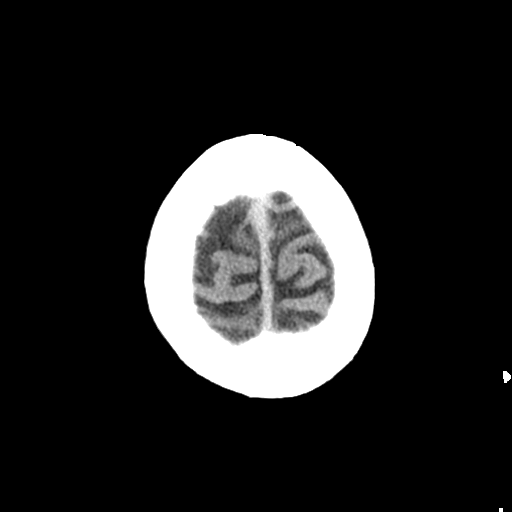
[im 27/29  brain]
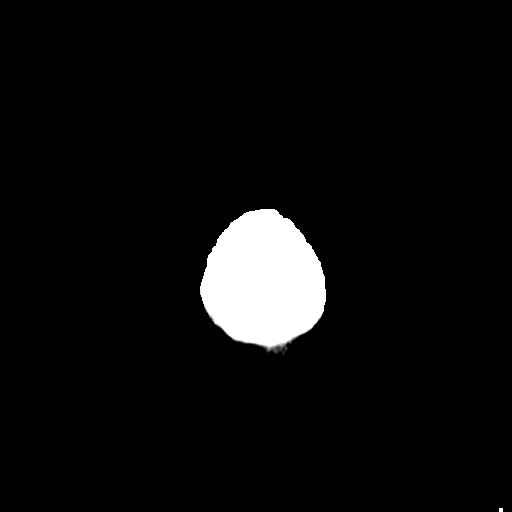
[im 27/29  bone]
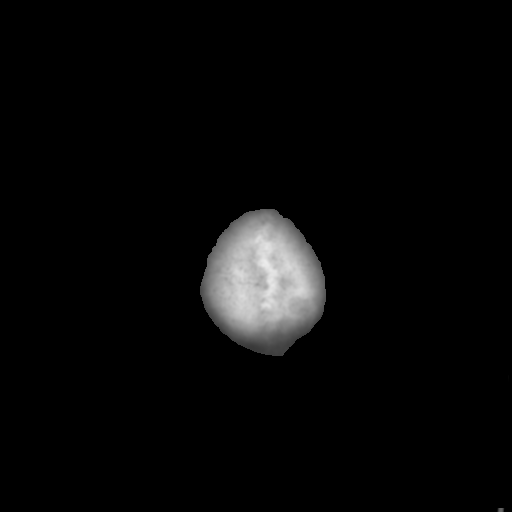

[Series 4: coronal soft tissue · coronal · 0.32mm/px · 3 of 69 slices shown]
[im 23/69  brain]
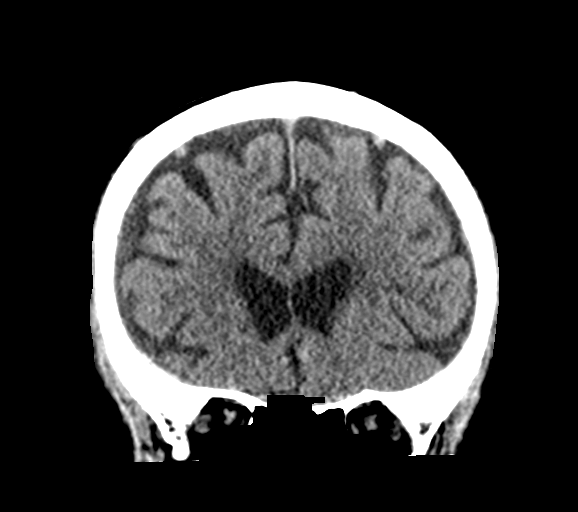
[im 31/69  brain]
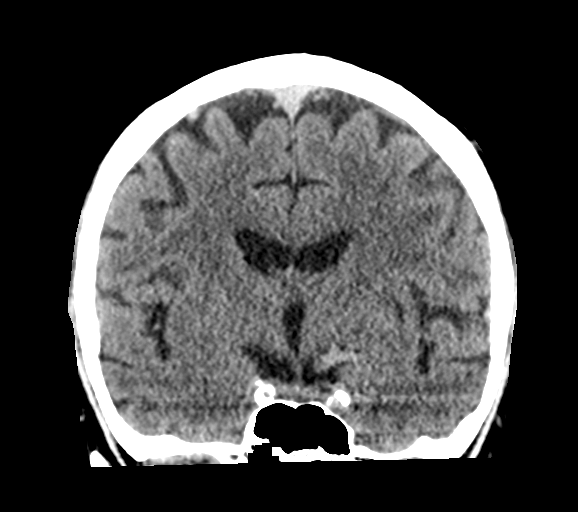
[im 38/69  brain]
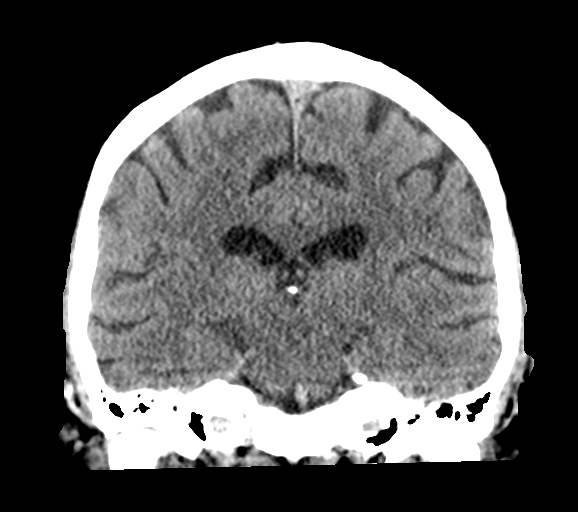

[Series 5: sagittal soft tissue · sagittal · 0.31mm/px · 3 of 52 slices shown]
[im 18/52  brain]
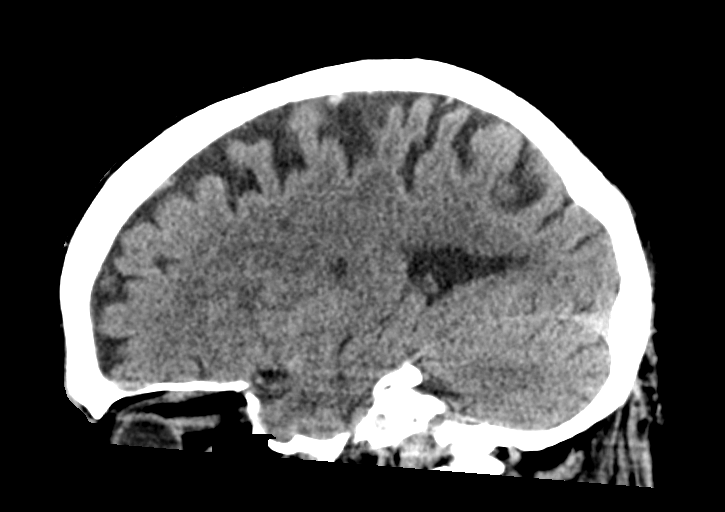
[im 26/52  brain]
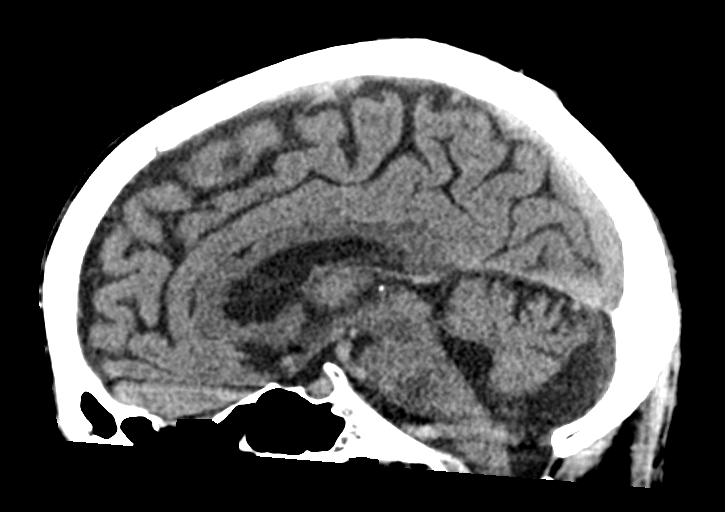
[im 35/52  brain]
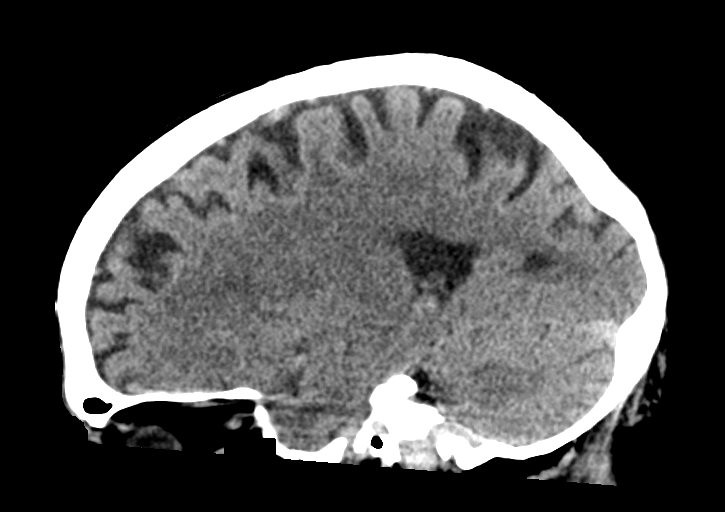

[15 of 46 positions shown; findings below may reference images not displayed]

FINDINGS: Brain: A age related atrophy. No sign of acute infarction. Old small
vessel infarctions in the basal ganglia regions. No mass lesion,
hemorrhage, hydrocephalus or extra-axial collection.

Vascular: There is atherosclerotic calcification of the major
vessels at the base of the brain.

Skull: No skull fracture.

Sinuses/Orbits: Clear/normal

Other: None
IMPRESSION: No acute or traumatic finding. Age related atrophy. Chronic
small-vessel ischemic changes in the basal ganglia.

## 2017-10-13 ENCOUNTER — Other Ambulatory Visit: Payer: Self-pay | Admitting: Family Medicine

## 2017-10-13 DIAGNOSIS — E1142 Type 2 diabetes mellitus with diabetic polyneuropathy: Secondary | ICD-10-CM

## 2017-10-16 ENCOUNTER — Telehealth: Payer: Self-pay | Admitting: Family Medicine

## 2017-10-16 NOTE — Telephone Encounter (Signed)
LM regarding need to scheudle MWV w [facility] prior to physical on 12-16-17.

## 2017-10-28 ENCOUNTER — Ambulatory Visit: Payer: Medicare Other

## 2017-11-04 DIAGNOSIS — M1711 Unilateral primary osteoarthritis, right knee: Secondary | ICD-10-CM | POA: Diagnosis not present

## 2017-11-04 DIAGNOSIS — M25561 Pain in right knee: Secondary | ICD-10-CM | POA: Diagnosis not present

## 2017-12-02 ENCOUNTER — Ambulatory Visit: Payer: Medicare Other

## 2017-12-09 ENCOUNTER — Ambulatory Visit: Payer: Medicare Other

## 2017-12-16 ENCOUNTER — Other Ambulatory Visit: Payer: Medicare Other

## 2017-12-16 DIAGNOSIS — I129 Hypertensive chronic kidney disease with stage 1 through stage 4 chronic kidney disease, or unspecified chronic kidney disease: Secondary | ICD-10-CM | POA: Diagnosis not present

## 2017-12-16 DIAGNOSIS — Z8546 Personal history of malignant neoplasm of prostate: Secondary | ICD-10-CM

## 2017-12-16 DIAGNOSIS — E785 Hyperlipidemia, unspecified: Secondary | ICD-10-CM | POA: Diagnosis not present

## 2017-12-16 DIAGNOSIS — E1142 Type 2 diabetes mellitus with diabetic polyneuropathy: Secondary | ICD-10-CM | POA: Diagnosis not present

## 2017-12-16 DIAGNOSIS — N183 Chronic kidney disease, stage 3 (moderate): Secondary | ICD-10-CM | POA: Diagnosis not present

## 2017-12-16 DIAGNOSIS — G63 Polyneuropathy in diseases classified elsewhere: Secondary | ICD-10-CM | POA: Diagnosis not present

## 2017-12-16 DIAGNOSIS — E1169 Type 2 diabetes mellitus with other specified complication: Secondary | ICD-10-CM | POA: Diagnosis not present

## 2017-12-17 LAB — CBC WITH DIFFERENTIAL/PLATELET
Basophils Absolute: 51 cells/uL (ref 0–200)
Basophils Relative: 0.7 %
Eosinophils Absolute: 88 cells/uL (ref 15–500)
Eosinophils Relative: 1.2 %
HEMATOCRIT: 50 % (ref 38.5–50.0)
HEMOGLOBIN: 17.3 g/dL — AB (ref 13.2–17.1)
Lymphs Abs: 3044 cells/uL (ref 850–3900)
MCH: 29.1 pg (ref 27.0–33.0)
MCHC: 34.6 g/dL (ref 32.0–36.0)
MCV: 84 fL (ref 80.0–100.0)
MPV: 10.2 fL (ref 7.5–12.5)
Monocytes Relative: 9.1 %
NEUTROS ABS: 3453 {cells}/uL (ref 1500–7800)
NEUTROS PCT: 47.3 %
Platelets: 178 10*3/uL (ref 140–400)
RBC: 5.95 10*6/uL — ABNORMAL HIGH (ref 4.20–5.80)
RDW: 13.2 % (ref 11.0–15.0)
Total Lymphocyte: 41.7 %
WBC mixed population: 664 cells/uL (ref 200–950)
WBC: 7.3 10*3/uL (ref 3.8–10.8)

## 2017-12-17 LAB — COMPLETE METABOLIC PANEL WITH GFR
AG RATIO: 1.5 (calc) (ref 1.0–2.5)
ALKALINE PHOSPHATASE (APISO): 43 U/L (ref 40–115)
ALT: 22 U/L (ref 9–46)
AST: 27 U/L (ref 10–35)
Albumin: 4.6 g/dL (ref 3.6–5.1)
BILIRUBIN TOTAL: 0.6 mg/dL (ref 0.2–1.2)
BUN/Creatinine Ratio: 18 (calc) (ref 6–22)
BUN: 24 mg/dL (ref 7–25)
CHLORIDE: 96 mmol/L — AB (ref 98–110)
CO2: 29 mmol/L (ref 20–32)
Calcium: 10.2 mg/dL (ref 8.6–10.3)
Creat: 1.33 mg/dL — ABNORMAL HIGH (ref 0.70–1.11)
GFR, Est African American: 58 mL/min/{1.73_m2} — ABNORMAL LOW (ref >=60)
GFR, Est Non African American: 50 mL/min/{1.73_m2} — ABNORMAL LOW (ref >=60)
GLUCOSE: 136 mg/dL (ref 65–139)
Globulin: 3 g/dL (calc) (ref 1.9–3.7)
POTASSIUM: 5.6 mmol/L — AB (ref 3.5–5.3)
Sodium: 137 mmol/L (ref 135–146)
Total Protein: 7.6 g/dL (ref 6.1–8.1)

## 2017-12-17 LAB — LIPID PANEL
CHOL/HDL RATIO: 5.7 (calc) — AB (ref <5.0)
Cholesterol: 206 mg/dL — ABNORMAL HIGH (ref <200)
HDL: 36 mg/dL — AB (ref >40)
LDL CHOLESTEROL (CALC): 126 mg/dL — AB
Non-HDL Cholesterol (Calc): 170 mg/dL (calc) — ABNORMAL HIGH (ref <130)
TRIGLYCERIDES: 289 mg/dL — AB (ref <150)

## 2017-12-17 LAB — HEMOGLOBIN A1C
EAG (MMOL/L): 9.5 (calc)
Hgb A1c MFr Bld: 7.6 % of total Hgb — ABNORMAL HIGH (ref <5.7)
Mean Plasma Glucose: 171 (calc)

## 2017-12-17 LAB — PSA, TOTAL WITH REFLEX TO PSA, FREE: PSA, Total: <0.1 ng/mL (ref <=4.0)

## 2017-12-18 ENCOUNTER — Encounter: Payer: Self-pay | Admitting: Family Medicine

## 2017-12-23 ENCOUNTER — Encounter: Payer: Self-pay | Admitting: Family Medicine

## 2017-12-23 ENCOUNTER — Ambulatory Visit (INDEPENDENT_AMBULATORY_CARE_PROVIDER_SITE_OTHER): Payer: Medicare Other

## 2017-12-23 ENCOUNTER — Ambulatory Visit (INDEPENDENT_AMBULATORY_CARE_PROVIDER_SITE_OTHER): Payer: Medicare Other | Admitting: Family Medicine

## 2017-12-23 VITALS — BP 110/70 | HR 64 | Temp 98.2°F | Resp 16 | Ht 70.0 in | Wt 167.2 lb

## 2017-12-23 VITALS — BP 110/70 | HR 64 | Temp 98.2°F | Resp 16 | Ht 70.0 in | Wt 167.0 lb

## 2017-12-23 DIAGNOSIS — N183 Chronic kidney disease, stage 3 unspecified: Secondary | ICD-10-CM

## 2017-12-23 DIAGNOSIS — M791 Myalgia, unspecified site: Secondary | ICD-10-CM | POA: Diagnosis not present

## 2017-12-23 DIAGNOSIS — G63 Polyneuropathy in diseases classified elsewhere: Secondary | ICD-10-CM | POA: Diagnosis not present

## 2017-12-23 DIAGNOSIS — E1142 Type 2 diabetes mellitus with diabetic polyneuropathy: Secondary | ICD-10-CM | POA: Diagnosis not present

## 2017-12-23 DIAGNOSIS — Z0000 Encounter for general adult medical examination without abnormal findings: Secondary | ICD-10-CM

## 2017-12-23 DIAGNOSIS — M150 Primary generalized (osteo)arthritis: Secondary | ICD-10-CM | POA: Diagnosis not present

## 2017-12-23 DIAGNOSIS — I129 Hypertensive chronic kidney disease with stage 1 through stage 4 chronic kidney disease, or unspecified chronic kidney disease: Secondary | ICD-10-CM

## 2017-12-23 DIAGNOSIS — E785 Hyperlipidemia, unspecified: Secondary | ICD-10-CM | POA: Diagnosis not present

## 2017-12-23 DIAGNOSIS — M159 Polyosteoarthritis, unspecified: Secondary | ICD-10-CM

## 2017-12-23 DIAGNOSIS — E1169 Type 2 diabetes mellitus with other specified complication: Secondary | ICD-10-CM | POA: Diagnosis not present

## 2017-12-23 DIAGNOSIS — Z8546 Personal history of malignant neoplasm of prostate: Secondary | ICD-10-CM | POA: Diagnosis not present

## 2017-12-23 DIAGNOSIS — T466X5A Adverse effect of antihyperlipidemic and antiarteriosclerotic drugs, initial encounter: Secondary | ICD-10-CM | POA: Insufficient documentation

## 2017-12-23 DIAGNOSIS — G720 Drug-induced myopathy: Secondary | ICD-10-CM | POA: Insufficient documentation

## 2017-12-23 MED ORDER — LISINOPRIL 10 MG PO TABS: ORAL_TABLET | ORAL | 3 refills | Status: DC

## 2017-12-23 NOTE — Assessment & Plan Note (Signed)
Well controlled HTN Complication: CKD-III - last lab 11/2017 was Cr 1.3 remains at baseline  Plan: 1. CONTINUE Lisinopril 10mg  daily 2. Improve hydration, avoid NSAIDs may use Tylenol and topical NSAID, lifestyle regular exercise, low sodium 3. Monitor BP outside office if he can occasionally 4. Follow-up 6 months

## 2017-12-23 NOTE — Patient Instructions (Signed)
[name] , Thank you for taking time to come for your Medicare Wellness Visit. I appreciate your ongoing commitment to your health goals. Please review the following plan we discussed and let me know if I can assist you in the future.   Screening recommendations/referrals: Colonoscopy: no longer required Recommended yearly ophthalmology/optometry visit for glaucoma screening and checkup Recommended yearly dental visit for hygiene and checkup  Vaccinations: Influenza vaccine: due 01/2018 Pneumococcal vaccine: completed series Tdap vaccine: up to date Shingles vaccine: shingrix eligible, check with your insurance company for coverage     Advanced directives: Advance directive discussed with you today. I have provided a copy for you to complete at home and have notarized. Once this is complete please bring a copy in to our office so we can scan it into your chart.  Conditions/risks identified: Recommend drinking at least 6-8 glasses of water a day   Next appointment: Follow up in one year for your annual wellness exam.   Preventive Care 65 Years and Older, Male Preventive care refers to lifestyle choices and visits with your health care provider that can promote health and wellness. What does preventive care include?  A yearly physical exam. This is also called an annual well check.  Dental exams once or twice a year.  Routine eye exams. Ask your health care provider how often you should have your eyes checked.  Personal lifestyle choices, including:  Daily care of your teeth and gums.  Regular physical activity.  Eating a healthy diet.  Avoiding tobacco and drug use.  Limiting alcohol use.  Practicing safe sex.  Taking low doses of aspirin every day.  Taking vitamin and mineral supplements as recommended by your health care provider. What happens during an annual well check? The services and screenings done by your health care provider during your annual well check  will depend on your age, overall health, lifestyle risk factors, and family history of disease. Counseling  Your health care provider may ask you questions about your:  Alcohol use.  Tobacco use.  Drug use.  Emotional well-being.  Home and relationship well-being.  Sexual activity.  Eating habits.  History of falls.  Memory and ability to understand (cognition).  Work and work Statistician. Screening  You may have the following tests or measurements:  Height, weight, and BMI.  Blood pressure.  Lipid and cholesterol levels. These may be checked every 5 years, or more frequently if you are over 78 years old.  Skin check.  Lung cancer screening. You may have this screening every year starting at age 16 if you have a 30-pack-year history of smoking and currently smoke or have quit within the past 15 years.  Fecal occult blood test (FOBT) of the stool. You may have this test every year starting at age 28.  Flexible sigmoidoscopy or colonoscopy. You may have a sigmoidoscopy every 5 years or a colonoscopy every 10 years starting at age 56.  Prostate cancer screening. Recommendations will vary depending on your family history and other risks.  Hepatitis C blood test.  Hepatitis B blood test.  Sexually transmitted disease (STD) testing.  Diabetes screening. This is done by checking your blood sugar (glucose) after you have not eaten for a while (fasting). You may have this done every 1-3 years.  Abdominal aortic aneurysm (AAA) screening. You may need this if you are a current or former smoker.  Osteoporosis. You may be screened starting at age 5 if you are at high risk. Talk with  your health care provider about your test results, treatment options, and if necessary, the need for more tests. Vaccines  Your health care provider may recommend certain vaccines, such as:  Influenza vaccine. This is recommended every year.  Tetanus, diphtheria, and acellular pertussis  (Tdap, Td) vaccine. You may need a Td booster every 10 years.  Zoster vaccine. You may need this after age 52.  Pneumococcal 13-valent conjugate (PCV13) vaccine. One dose is recommended after age 72.  Pneumococcal polysaccharide (PPSV23) vaccine. One dose is recommended after age 30. Talk to your health care provider about which screenings and vaccines you need and how often you need them. This information is not intended to replace advice given to you by your health care provider. Make sure you discuss any questions you have with your health care provider. Document Released: 06/09/2015 Document Revised: 01/31/2016 Document Reviewed: 03/14/2015 Elsevier Interactive Patient Education  2017 [place] Prevention in the Home Falls can cause injuries. They can happen to people of all ages. There are many things you can do to make your home safe and to help prevent falls. What can I do on the outside of my home?  Regularly fix the edges of walkways and driveways and fix any cracks.  Remove anything that might make you trip as you walk through a door, such as a raised step or threshold.  Trim any bushes or trees on the path to your home.  Use bright outdoor lighting.  Clear any walking paths of anything that might make someone trip, such as rocks or tools.  Regularly check to see if handrails are loose or broken. Make sure that both sides of any steps have handrails.  Any raised decks and porches should have guardrails on the edges.  Have any leaves, snow, or ice cleared regularly.  Use sand or salt on walking paths during winter.  Clean up any spills in your garage right away. This includes oil or grease spills. What can I do in the bathroom?  Use night lights.  Install grab bars by the toilet and in the tub and shower. Do not use towel bars as grab bars.  Use non-skid mats or decals in the tub or shower.  If you need to sit down in the shower, use a plastic, non-slip  stool.  Keep the floor dry. Clean up any water that spills on the floor as soon as it happens.  Remove soap buildup in the tub or shower regularly.  Attach bath mats securely with double-sided non-slip rug tape.  Do not have throw rugs and other things on the floor that can make you trip. What can I do in the bedroom?  Use night lights.  Make sure that you have a light by your bed that is easy to reach.  Do not use any sheets or blankets that are too big for your bed. They should not hang down onto the floor.  Have a firm chair that has side arms. You can use this for support while you get dressed.  Do not have throw rugs and other things on the floor that can make you trip. What can I do in the kitchen?  Clean up any spills right away.  Avoid walking on wet floors.  Keep items that you use a lot in easy-to-reach places.  If you need to reach something above you, use a strong step stool that has a grab bar.  Keep electrical cords out of the way.  Do not  use floor polish or wax that makes floors slippery. If you must use wax, use non-skid floor wax.  Do not have throw rugs and other things on the floor that can make you trip. What can I do with my stairs?  Do not leave any items on the stairs.  Make sure that there are handrails on both sides of the stairs and use them. Fix handrails that are broken or loose. Make sure that handrails are as long as the stairways.  Check any carpeting to make sure that it is firmly attached to the stairs. Fix any carpet that is loose or worn.  Avoid having throw rugs at the top or bottom of the stairs. If you do have throw rugs, attach them to the floor with carpet tape.  Make sure that you have a light switch at the top of the stairs and the bottom of the stairs. If you do not have them, ask someone to add them for you. What else can I do to help prevent falls?  Wear shoes that:  Do not have high heels.  Have rubber bottoms.  Are  comfortable and fit you well.  Are closed at the toe. Do not wear sandals.  If you use a stepladder:  Make sure that it is fully opened. Do not climb a closed stepladder.  Make sure that both sides of the stepladder are locked into place.  Ask someone to hold it for you, if possible.  Clearly mark and make sure that you can see:  Any grab bars or handrails.  First and last steps.  Where the edge of each step is.  Use tools that help you move around (mobility aids) if they are needed. These include:  Canes.  Walkers.  Scooters.  Crutches.  Turn on the lights when you go into a dark area. Replace any light bulbs as soon as they burn out.  Set up your furniture so you have a clear path. Avoid moving your furniture around.  If any of your floors are uneven, fix them.  If there are any pets around you, be aware of where they are.  Review your medicines with your doctor. Some medicines can make you feel dizzy. This can increase your chance of falling. Ask your doctor what other things that you can do to help prevent falls. This information is not intended to replace advice given to you by your health care provider. Make sure you discuss any questions you have with your health care provider. Document Released: 03/09/2009 Document Revised: 10/19/2015 Document Reviewed: 06/17/2014 Elsevier Interactive Patient Education  2017 [employer].

## 2017-12-23 NOTE — Assessment & Plan Note (Signed)
Underlying etiology of chronic pain affecting multiple joints Relatively stable without flare Continue Voltaren topical diclofenac cream Avoid oral NSAID Continue Tylenol Follow-up in future if pursue knee steroid injections - he may return to provider in [place] if interested in other injections - may benefit from future Ortho ref but he declines surgery at this time 

## 2017-12-23 NOTE — Progress Notes (Signed)
Subjective:    Patient ID: [name], male    DOB: May 13, 1934, 84 y.o.   MRN: [id number]  [name] is a 84 y.o. male presenting on 12/23/2017 for Diabetes and Osteoarthritis   HPI   Here for yearly check up and already seen by Tyler Aas LPN for Annual Medicare Wellness earlier today.  CHRONIC DM, Type 2 with DM Neuropathy: No new concerns. Last lab showed improvement A1c 7.6 from previous >9 CBGs:Checks fasting AM CBG 1-3 x weekly - avg 120s Meds: Metformin XR 523m daily Reports good compliance. Tolerating well w/o side-effects Currently on ACEi Lifestyle: - Diet (Improving diet, eats regular meal at retirement community, still limits starches biscuits and bread, eats fruit and cereal in morning, eats more fruit, some bananas and plums peaches) - Exercise (Occasional walking and treadmill / stair stepper,and 3 x weekly at community with light weights and exercise program - he even helped lead the exercise program recently. Also some Tai Chi) - Last Eye Exam 02/2017, Dr WEllin Mayhew- For neuropathy, still taking Gabapentin 12028mTID - seems to help his symptoms, without side effects, still has some refractory symptoms again only with stinging and burning but has reduced sensation affecting his balance. Denies hypoglycemia, polyuria, visual changes  FOLLOW-UP Left Shoulder Pain/Bursitis / R Upper Back Pain / History of osteoarthritis multiple joints [facility] (similar to stem cell) it was a "processed powder from placenta/umbilical cord" to promote regenerative healing. He will follow-up with them in future. He would consider a steroid injection in knees in future, has had before temporary relief. Previously followed by Chiropractor Dr NoOlen Cordial Taking Tylenol PRN with some relief - Uses topical Voltaren PRN some relief Denies any fall or new injury, redness swelling  CHRONIC HTN / CKD-III Lab show stable kidney function  with CKD-III Reports no new concerns. Checks BP outside office occasionally Current Meds - Lisinopril 1023maily   Reports good compliance, took meds today. Tolerating well, w/o complaints. Denies CP, dyspnea, HA, edema, dizziness / lightheadedness  HYPERLIPIDEMIA: - Reports no concerns. Last lipid panel 11/2017, improved reading, still elevated TG and LDL - Currently taking Zetia 73m59molerating well without side effects or myalgias   Depression screen PHQ [facility] 12/23/2017 12/23/2017 06/24/2017  Decreased Interest 0 0 0  Down, Depressed, Hopeless 0 0 0  PHQ - 2 Score 0 0 0  Altered sleeping - - -  Tired, decreased energy - - -  Change in appetite - - -  Feeling bad or failure about yourself  - - -  Trouble concentrating - - -  Moving slowly or fidgety/restless - - -  Suicidal thoughts - - -  PHQ-9 Score - - -    Past Medical History:  Diagnosis Date  . Hypertension   . Neuropathy   [facility] Past Surgical History:  Procedure Laterality Date  . CATARACT EXTRACTION W/PHACO Left 04/24/2015   Procedure: CATARACT EXTRACTION PHACO AND INTRAOCULAR LENS PLACEMENT (IOC);  Surgeon: StevEstill Cotta;  Location: [facility] ORS;  Service: Ophthalmology;  Laterality: Left;  US [id number] AP% 25.7 CDE 28.30 fluid pack lot #[id number]. COLONOSCOPY     Social History   Socioeconomic History  . Marital status: Widowed    Spouse name: Not on file  . Number of children: Not on file  . Years of education: Not on file  . Highest education level: High school graduate  Occupational  History  . Not on file  Social Needs  . Financial resource strain: Not hard at all  . Food insecurity:    Worry: Never true    Inability: Never true  . Transportation needs:    Medical: No    Non-medical: No  Tobacco Use  . Smoking status: Former Smoker    Last attempt to quit: 05/27/1978    Years since quitting: 39.6  . Smokeless tobacco: Never Used  Substance and Sexual Activity  . Alcohol  use: No    Alcohol/week: 0.0 oz  . Drug use: No  . Sexual activity: Not on file  Lifestyle  . Physical activity:    Days per week: 0 days    Minutes per session: 0 min  . Stress: Not at all  Relationships  . Social connections:    Talks on phone: Twice a week    Gets together: More than three times a week    Attends religious service: More than 4 times per year    Active member of club or organization: Yes    Attends meetings of clubs or organizations: More than 4 times per year    Relationship status: Widowed  . Intimate partner violence:    Fear of current or ex partner: No    Emotionally abused: No    Physically abused: No    Forced sexual activity: No  Other Topics Concern  . Not on file  Social History Narrative  . Not on file   Family History  Problem Relation Age of Onset  . Stroke Mother   . Cancer Mother        colon cancer  . COPD Father   . COPD Brother   . Hearing loss Son    Current Outpatient Medications on File Prior to Visit  Medication Sig  . DUREZOL 0.05 % EMUL Place 6.14 application into the left eye as needed.  . gabapentin (NEURONTIN) 600 MG tablet Take 2 tablets (1,200 mg total) by mouth 3 (three) times daily.  Marland Kitchen glucosamine-chondroitin 500-400 MG tablet Take 1 tablet by mouth 3 (three) times daily as needed.   . metFORMIN (GLUCOPHAGE-XR) 500 MG 24 hr tablet Take 2 tablets (1,000 mg total) by mouth daily with breakfast.  . VOLTAREN 1 % GEL 4 GRAMS, Transdermal, four times daily, as needed  . ZETIA 10 MG tablet Take 1 tablet (10 mg total) by mouth at bedtime.   No current facility-administered medications on file prior to visit.     Review of Systems Per HPI unless specifically indicated above     Objective:    BP 110/70   Pulse 64   Temp 98.2 F (36.8 C) (Oral)   Resp 16   Ht '5\' 10"'  (1.778 m)   Wt 167 lb (75.8 kg)   BMI 23.96 kg/m   Wt Readings from Last 3 Encounters:  12/23/17 167 lb 3.2 oz (75.8 kg)  12/23/17 167 lb (75.8 kg)    06/24/17 174 lb (78.9 kg)    Physical Exam  Constitutional: He is oriented to person, place, and time. He appears well-developed and well-nourished. No distress.  Well-appearing, comfortable, cooperative  HENT:  Head: Normocephalic and atraumatic.  Mouth/Throat: Oropharynx is clear and moist.  Eyes: Conjunctivae are normal. Right eye exhibits no discharge. Left eye exhibits no discharge.  Neck: Normal range of motion. Neck supple. No thyromegaly present.  Cardiovascular: Normal rate, regular rhythm, normal heart sounds and intact distal pulses.  No murmur heard. Pulmonary/Chest: Effort normal  and breath sounds normal. No respiratory distress. He has no wheezes. He has no rales.  Musculoskeletal: Normal range of motion. He exhibits no edema.  Lymphadenopathy:    He has no cervical adenopathy.  Neurological: He is alert and oriented to person, place, and time.  Skin: Skin is warm and dry. No rash noted. He is not diaphoretic. No erythema.  Psychiatric: He has a normal mood and affect. His behavior is normal.  Well groomed, good eye contact, normal speech and thoughts  Nursing note and vitals reviewed.    Diabetic Foot Exam - Simple   Simple Foot Form Diabetic Foot exam was performed with the following findings:  Yes 12/23/2017  9:25 AM  Visual Inspection See comments:  Yes Sensation Testing Intact to touch and monofilament testing bilaterally:  Yes See comments:  Yes Pulse Check Posterior Tibialis and Dorsalis pulse intact bilaterally:  Yes Comments Slightly reduced monofilament sensation but still intact bilateral feet, some reduced mostly R > L over dorsal great toe region. Some mild dry skin and callus formation heels.      Results for orders placed or performed in visit on 12/16/17  PSA, Total with Reflex to PSA, Free  Result Value Ref Range   PSA, Total <0.1 < OR = 4.0 ng/mL  CBC with Differential/Platelet  Result Value Ref Range   WBC 7.3 3.8 - 10.8 Thousand/uL    RBC 5.95 (H) 4.20 - 5.80 Million/uL   Hemoglobin 17.3 (H) 13.2 - 17.1 g/dL   HCT 50.0 38.5 - 50.0 %   MCV 84.0 80.0 - 100.0 fL   MCH 29.1 27.0 - 33.0 pg   MCHC 34.6 32.0 - 36.0 g/dL   RDW 13.2 11.0 - 15.0 %   Platelets 178 140 - 400 Thousand/uL   MPV 10.2 7.5 - 12.5 fL   Neutro Abs 3,453 1,500 - 7,800 cells/uL   Lymphs Abs 3,044 850 - 3,900 cells/uL   WBC mixed population 664 200 - 950 cells/uL   Eosinophils Absolute 88 15 - 500 cells/uL   Basophils Absolute 51 0 - 200 cells/uL   Neutrophils Relative % 47.3 %   Total Lymphocyte 41.7 %   Monocytes Relative 9.1 %   Eosinophils Relative 1.2 %   Basophils Relative 0.7 %  Lipid panel  Result Value Ref Range   Cholesterol 206 (H) <200 mg/dL   HDL 36 (L) >40 mg/dL   Triglycerides 289 (H) <150 mg/dL   LDL Cholesterol (Calc) 126 (H) mg/dL (calc)   Total CHOL/HDL Ratio 5.7 (H) <5.0 (calc)   Non-HDL Cholesterol (Calc) 170 (H) <130 mg/dL (calc)  Hemoglobin A1c  Result Value Ref Range   Hgb A1c MFr Bld 7.6 (H) <5.7 % of total Hgb   Mean Plasma Glucose 171 (calc)   eAG (mmol/L) 9.5 (calc)  COMPLETE METABOLIC PANEL WITH GFR  Result Value Ref Range   Glucose, Bld 136 65 - 139 mg/dL   BUN 24 7 - 25 mg/dL   Creat 1.33 (H) 0.70 - 1.11 mg/dL   GFR, Est Non African American 50 (L) > OR = 60 mL/min/1.58m   GFR, Est African American 58 (L) > OR = 60 mL/min/1.773m  BUN/Creatinine Ratio 18 6 - 22 (calc)   Sodium 137 135 - 146 mmol/L   Potassium 5.6 (H) 3.5 - 5.3 mmol/L   Chloride 96 (L) 98 - 110 mmol/L   CO2 29 20 - 32 mmol/L   Calcium 10.2 8.6 - 10.3 mg/dL   Total Protein 7.6  6.1 - 8.1 g/dL   Albumin 4.6 3.6 - 5.1 g/dL   Globulin 3.0 1.9 - 3.7 g/dL (calc)   AG Ratio 1.5 1.0 - 2.5 (calc)   Total Bilirubin 0.6 0.2 - 1.2 mg/dL   Alkaline phosphatase (APISO) 43 40 - 115 U/L   AST 27 10 - 35 U/L   ALT 22 9 - 46 U/L      Assessment & Plan:   Problem List Items Addressed This Visit    Benign hypertension with CKD (chronic kidney disease)  stage III ([place])    Well controlled HTN Complication: CKD-III - last lab 11/2017 was Cr 1.3 remains at baseline  Plan: 1. CONTINUE Lisinopril 78m daily 2. Improve hydration, avoid NSAIDs may use Tylenol and topical NSAID, lifestyle regular exercise, low sodium 3. Monitor BP outside office if he can occasionally 4. Follow-up 6 months      Relevant Medications   lisinopril (PRINIVIL,ZESTRIL) 10 MG tablet   Hyperlipidemia associated with type 2 diabetes mellitus ([place]    Partially Controlled cholesterol on Zetia and lifestyle Last lipid panel 11/2017 - elevated TG and LDL Calculated ASCVD 10 yr risk score elevated History of statin intolerance  Plan: 1. Continue current meds - Zetia 127m- defer future statin for now 2. Encourage improved lifestyle - low carb/cholesterol, reduce portion size, continue improving regular exercise 3. Follow-up 6 months - future lipids yearly       Relevant Medications   lisinopril (PRINIVIL,ZESTRIL) 10 MG tablet   Myalgia due to statin   Personal history of prostate cancer    Stable, asymptomatic PSA < 0.1, negative Follow-up      Polyneuropathy associated with underlying disease ([place]   See A&P for DM with neuropathy Complication of DM Controlled on Gabapentin - discussed possible lyrica would not address sensation but may reduce pain - he prefers to avoid new med now      Primary osteoarthritis involving multiple joints    Underlying etiology of chronic pain affecting multiple joints Relatively stable without flare Continue Voltaren topical diclofenac cream Avoid oral NSAID Continue Tylenol Follow-up in future if pursue knee steroid injections - [place] - may benefit from future Ortho ref but he declines surgery at this time      Type 2 diabetes mellitus with diabetic polyneuropathy ([place]- Primary    Significantly improved A1c to 7.6, previous >9 - seems more consistent with  prior readings now Complications - CKD-III, peripheral neuropathy, other including hyperlipidemia specifically hypertriglyceridemia - increases risk of future cardiovascular complications    Plan:  1. Continue current therapy - Metformin XR 50025m. Encourage improved lifestyle - low carb, low sugar diet, reduce portion size, continue improving regular exercise 3. Continue ACEi 4. Continue Gabapentin 1200 TID for now, still consider future Lyrica - advised caution with new med more risk side effect - would unlikely help his reduced sensation. 5. F/u with Dr WooPhoebe Sharps 2019 - DM Foot Exam done today 6. Follow-up 6 months for DM A1c      Relevant Medications   lisinopril (PRINIVIL,ZESTRIL) 10 MG tablet      Meds ordered this encounter  Medications  . lisinopril (PRINIVIL,ZESTRIL) 10 MG tablet    Sig: 1 (one) Tablet, Oral, daily    Dispense:  90 tablet    Refill:  3    Follow up plan: Return in about 6 months (around 06/25/2018) for DM A1c, Arthritis, Neuropathy.  Nobie Putnam, [facility] 12/23/2017, 1:11 PM

## 2017-12-23 NOTE — Assessment & Plan Note (Signed)
Stable, asymptomatic PSA < 0.1, negative Follow-up

## 2017-12-23 NOTE — Patient Instructions (Addendum)
Thank you for coming to the office today.  Diabetes is improved 7.2 - previously 9.3  Recent Labs    03/24/17 0947 06/24/17 0907 12/16/17 0940  HGBA1C 7.2* 9.3* 7.6*   Let me know if need more Diclofenac or Voltaren topical cream  Continue with Gabapentin for neuropathy - I dont think Lyrica will be the best choice now, as this will only help pain but not the reduced sensation and balance.  Let me know if you need a Cortisone injection in our office for knees  If you get Flu Shot at [facility] - you can bring [place] record / paper  Please schedule a Follow-up Appointment to: Return in about 6 months (around 06/25/2018) for DM A1c, Arthritis, Neuropathy.  If you have any other questions or concerns, please feel free to call the office or send a message through [place]. You may also schedule an earlier appointment if necessary.  Additionally, you may be receiving a survey about your experience at our office within a few days to 1 week by e-mail or mail. We value your feedback.  Nobie Putnam, DO [place]

## 2017-12-23 NOTE — Assessment & Plan Note (Signed)
Partially Controlled cholesterol on Zetia and lifestyle Last lipid panel 11/2017 - elevated TG and LDL Calculated ASCVD 10 yr risk score elevated History of statin intolerance  Plan: 1. Continue current meds - Zetia 10mg  - defer future statin for now 2. Encourage improved lifestyle - low carb/cholesterol, reduce portion size, continue improving regular exercise 3. Follow-up 6 months - future lipids yearly

## 2017-12-23 NOTE — Progress Notes (Signed)
Subjective:   [name] is a 84 y.o. male who presents for Medicare Annual/Subsequent preventive examination.  Resides at [facility].   Review of Systems:    Cardiac Risk Factors include: advanced age (>90men, >47 women);hypertension;male gender;dyslipidemia;diabetes mellitus     Objective:    Vitals: BP 110/70 (BP Location: Left Arm, Patient Position: Sitting)   Pulse 64   Temp 98.2 F (36.8 C) (Oral)   Resp 16   Ht 5\' 10"  (1.778 m)   Wt 167 lb 3.2 oz (75.8 kg)   BMI 23.99 kg/m   Body mass index is 23.99 kg/m.  Advanced Directives 12/23/2017 12/13/2016 10/22/2016 04/24/2015  Does Patient Have a Medical Advance Directive? No No No No  Would patient like information on creating a medical advance directive? Yes (MAU/Ambulatory/Procedural Areas - Information given) - No - Patient declined No - patient declined information    Tobacco Social History   Tobacco Use  Smoking Status Former Smoker  . Last attempt to quit: 05/27/1978  . Years since quitting: 39.6  Smokeless Tobacco Never Used     Counseling given: Not Answered   Clinical Intake:  Pre-visit preparation completed: Yes        Nutritional Status: BMI of 19-24  Normal Nutritional Risks: None Diabetes: Yes CBG done?: No Did pt. bring in CBG monitor from home?: No  How often do you need to have someone help you when you read instructions, pamphlets, or other written materials from your doctor or pharmacy?: 1 - Never What is the last grade level you completed in school?: 12th grade, some college   Interpreter Needed?: No  Information entered by :: Ger Nicks,LPN   Past Medical History:  Diagnosis Date  . Hypertension   . Neuropathy   . Prostate cancer [facility])    Past Surgical History:  Procedure Laterality Date  . CATARACT EXTRACTION W/PHACO Left 04/24/2015   Procedure: CATARACT EXTRACTION PHACO AND INTRAOCULAR LENS PLACEMENT (IOC);  Surgeon: Estill Cotta, MD;  Location: [facility]  ORS;  Service: Ophthalmology;  Laterality: Left;  [place] 01:12 AP% 25.7 CDE 28.30 fluid pack lot #[id number]  . COLONOSCOPY     Family History  Problem Relation Age of Onset  . Stroke Mother   . Cancer Mother        colon cancer  . COPD Father   . COPD Brother   . Hearing loss Son    Social History   Socioeconomic History  . Marital status: Widowed    Spouse name: Not on file  . Number of children: Not on file  . Years of education: Not on file  . Highest education level: High school graduate  Occupational History  . Not on file  Social Needs  . Financial resource strain: Not hard at all  . Food insecurity:    Worry: Never true    Inability: Never true  . Transportation needs:    Medical: No    Non-medical: No  Tobacco Use  . Smoking status: Former Smoker    Last attempt to quit: 05/27/1978    Years since quitting: 39.6  . Smokeless tobacco: Never Used  Substance and Sexual Activity  . Alcohol use: No    Alcohol/week: 0.0 oz  . Drug use: No  . Sexual activity: Not on file  Lifestyle  . Physical activity:    Days per week: 0 days    Minutes per session: 0 min  . Stress: Not at all  Relationships  . Social connections:  Talks on phone: Twice a week    Gets together: More than three times a week    Attends religious service: More than 4 times per year    Active member of club or organization: Yes    Attends meetings of clubs or organizations: More than 4 times per year    Relationship status: Widowed  Other Topics Concern  . Not on file  Social History Narrative  . Not on file    Outpatient Encounter Medications as of 12/23/2017  Medication Sig  . DUREZOL 0.05 % EMUL Place 6.30 application into the left eye as needed.  . gabapentin (NEURONTIN) 600 MG tablet Take 2 tablets (1,200 mg total) by mouth 3 (three) times daily.  Marland Kitchen glucosamine-chondroitin 500-400 MG tablet Take 1 tablet by mouth 3 (three) times daily as needed.   Marland Kitchen lisinopril (PRINIVIL,ZESTRIL) 10 MG  tablet 1 (one) Tablet, Oral, daily  . metFORMIN (GLUCOPHAGE-XR) 500 MG 24 hr tablet Take 2 tablets (1,000 mg total) by mouth daily with breakfast.  . VOLTAREN 1 % GEL 4 GRAMS, Transdermal, four times daily, as needed  . ZETIA 10 MG tablet Take 1 tablet (10 mg total) by mouth at bedtime.  . [DISCONTINUED] lisinopril (PRINIVIL,ZESTRIL) 10 MG tablet 1 (one) Tablet, Oral, daily   No facility-administered encounter medications on file as of 12/23/2017.     Activities of Daily Living In your present state of health, do you have any difficulty performing the following activities: 12/23/2017 12/23/2017  Hearing? N N  Vision? N N  Difficulty concentrating or making decisions? N N  Walking or climbing stairs? N N  Dressing or bathing? N N  Doing errands, shopping? N N  Preparing Food and eating ? N -  Using the Toilet? N -  In the past six months, have you accidently leaked urine? N -  Do you have problems with loss of bowel control? N -  Managing your Medications? N -  Managing your Finances? N -  Housekeeping or managing your Housekeeping? N -  Some recent data might be hidden    Patient Care Team: Olin Hauser, DO as PCP - General (Family Medicine)   Assessment:   This is a routine wellness examination for [name].  Exercise Activities and Dietary recommendations Current Exercise Habits: Structured exercise class, Type of exercise: strength training/weights, Time (Minutes): 15, Frequency (Times/Week): 3, Weekly Exercise (Minutes/Week): 45, Intensity: Mild, Exercise limited by: None identified  Goals    . DIET - INCREASE WATER INTAKE     Recommend drinking at least 6-8 glasses of water a day        Fall Risk Fall Risk  12/23/2017 12/23/2017 06/24/2017 03/24/2017 10/22/2016  Falls in the past year? No No No No Yes  Number falls in past yr: - - - - 1  Injury with Fall? - - - - Yes  Comment - - - - neck injury  Risk Factor Category  - - - - High Fall Risk  Follow up - - - -  Falls prevention discussed   Is the patient's home free of loose throw rugs in walkways, pet beds, electrical cords, etc?   yes      Grab bars in the bathroom? yes      Handrails on the stairs?   no stairs       Adequate lighting?   yes  Timed Get Up and Go Performed: Completed in 8 seconds with no use of assistive devices, steady gait. No intervention needed  at this time.   Depression Screen PHQ 2/9 Scores 12/23/2017 12/23/2017 06/24/2017 03/24/2017  PHQ - 2 Score 0 0 0 0  PHQ- 9 Score - - - -    Cognitive Function     6CIT Screen 12/23/2017 10/22/2016  What Year? 0 points 0 points  What month? 0 points 0 points  What time? 0 points 0 points  Count back from 20 0 points 0 points  Months in reverse 0 points 0 points  Repeat phrase 2 points 2 points  Total Score 2 2    Immunization History  Administered Date(s) Administered  . Influenza, High Dose Seasonal PF 03/20/2016  . Influenza-Unspecified 03/23/2015, 03/11/2017  . Pneumococcal Conjugate-13 03/20/2016  . Pneumococcal Polysaccharide-23 05/07/2013    Qualifies for Shingles Vaccine? Yes, discussed shingrix vaccine   Screening Tests Health Maintenance  Topic Date Due  . INFLUENZA VACCINE  12/25/2017  . OPHTHALMOLOGY EXAM  03/05/2018  . HEMOGLOBIN A1C  06/18/2018  . FOOT EXAM  12/24/2018  . TETANUS/TDAP  06/24/2024  . PNA vac Low Risk Adult  Completed   Cancer Screenings: Lung: Low Dose CT Chest recommended if Age 77-80 years, 30 pack-year currently smoking OR have quit w/in 15years. Patient does not qualify. Colorectal: no longer required   Additional Screenings:  Hepatitis C Screening: not indicated      Plan:    I have personally reviewed and addressed the Medicare Annual Wellness questionnaire and have noted the following in the patient's chart:  A. Medical and social history B. Use of alcohol, tobacco or illicit drugs  C. Current medications and supplements D. Functional ability and status E.  Nutritional  status F.  Physical activity G. Advance directives H. List of other physicians I.  Hospitalizations, surgeries, and ER visits in previous 12 months J.  [place] such as hearing and vision if needed, cognitive and depression L. Referrals and appointments   In addition, I have reviewed and discussed with patient certain preventive protocols, quality metrics, and best practice recommendations. A written personalized care plan for preventive services as well as general preventive health recommendations were provided to patient.   Signed,  Tyler Aas, LPN Nurse Health Advisor   Nurse Notes:none

## 2017-12-23 NOTE — Assessment & Plan Note (Addendum)
See A&P for DM with neuropathy Complication of DM Controlled on Gabapentin - discussed possible lyrica would not address sensation but may reduce pain - he prefers to avoid new med now

## 2017-12-23 NOTE — Assessment & Plan Note (Addendum)
Significantly improved A1c to 7.6, previous >9 - seems more consistent with prior readings now Complications - CKD-III, peripheral neuropathy, other including hyperlipidemia specifically hypertriglyceridemia - increases risk of future cardiovascular complications    Plan:  1. Continue current therapy - Metformin XR 500mg  2. Encourage improved lifestyle - low carb, low sugar diet, reduce portion size, continue improving regular exercise 3. Continue ACEi 4. Continue Gabapentin 1200 TID for now, still consider future Lyrica - advised caution with new med more risk side effect - would unlikely help his reduced sensation. 5. F/u with Dr Phoebe Sharps in 2019 - DM Foot Exam done today 6. Follow-up 6 months for DM A1c

## 2018-03-03 DIAGNOSIS — Z23 Encounter for immunization: Secondary | ICD-10-CM | POA: Diagnosis not present

## 2018-03-04 LAB — HM DIABETES EYE EXAM

## 2018-04-20 DIAGNOSIS — D400 Neoplasm of uncertain behavior of prostate: Secondary | ICD-10-CM | POA: Diagnosis not present

## 2018-04-20 DIAGNOSIS — N5201 Erectile dysfunction due to arterial insufficiency: Secondary | ICD-10-CM | POA: Diagnosis not present

## 2018-04-20 DIAGNOSIS — C61 Malignant neoplasm of prostate: Secondary | ICD-10-CM | POA: Diagnosis not present

## 2018-05-12 ENCOUNTER — Ambulatory Visit (INDEPENDENT_AMBULATORY_CARE_PROVIDER_SITE_OTHER): Payer: Medicare Other | Admitting: Nurse Practitioner

## 2018-05-12 ENCOUNTER — Other Ambulatory Visit: Payer: Self-pay

## 2018-05-12 ENCOUNTER — Encounter: Payer: Self-pay | Admitting: Nurse Practitioner

## 2018-05-12 VITALS — BP 105/63 | HR 67 | Temp 98.3°F | Resp 18 | Ht 70.0 in | Wt 165.0 lb

## 2018-05-12 DIAGNOSIS — J0140 Acute pansinusitis, unspecified: Secondary | ICD-10-CM

## 2018-05-12 MED ORDER — AMOXICILLIN-POT CLAVULANATE 875-125 MG PO TABS: 1.0000 | ORAL_TABLET | Freq: Two times a day (BID) | ORAL | 0 refills | Status: AC

## 2018-05-12 NOTE — Progress Notes (Signed)
Subjective:    Patient ID: [name], male    DOB: 01/16/1934, 84 y.o.   MRN: [id number]  [name] is a 84 y.o. male presenting on 05/12/2018 for Cough (symptoms started of with sore throat, headache, nasal drainage, and now productive cough x 2 weeks)   HPI  URI Symptom onset about 14 days ago with sore throat, mild fever, continues headache, nasal drainage and productive cough.   - Denies any current fever. - has had intermittent ibuprofen with good relief of hot/cold sensations early in course. - Some new sinus pressure now for last 2-3 days. - Rhinorrhea  - Mucinex without significant relief.  Social History   Tobacco Use  . Smoking status: Former Smoker    Last attempt to quit: 05/27/1978    Years since quitting: 39.9  . Smokeless tobacco: Never Used  Substance Use Topics  . Alcohol use: No    Alcohol/week: 0.0 standard drinks  . Drug use: No    Review of Systems Per HPI unless specifically indicated above     Objective:    BP 105/63 (BP Location: Right Arm, Patient Position: Sitting, Cuff Size: Normal)   Pulse 67   Temp 98.3 F (36.8 C) (Oral)   Resp 18   Ht 5\' 10"  (1.778 m)   Wt 165 lb (74.8 kg)   SpO2 96%   BMI 23.68 kg/m   Wt Readings from Last 3 Encounters:  05/12/18 165 lb (74.8 kg)  12/23/17 167 lb 3.2 oz (75.8 kg)  12/23/17 167 lb (75.8 kg)    Physical Exam Vitals signs reviewed.  Constitutional:      General: He is not in acute distress.    Appearance: He is well-developed.  HENT:     Head: Normocephalic and atraumatic.     Right Ear: Hearing, tympanic membrane, ear canal and external ear normal.     Left Ear: Hearing, tympanic membrane, ear canal and external ear normal.     Nose: Nasal tenderness, mucosal edema, congestion and rhinorrhea present. No nasal deformity. Rhinorrhea is purulent.     Right Sinus: Maxillary sinus tenderness and frontal sinus tenderness present.     Left Sinus: Maxillary sinus tenderness and frontal  sinus tenderness present.     Mouth/Throat:     Lips: Pink.     Mouth: Mucous membranes are moist.     Tongue: No lesions.     Pharynx: Posterior oropharyngeal erythema (mildly injected) present. No pharyngeal swelling or uvula swelling.     Tonsils: No tonsillar exudate or tonsillar abscesses. Swelling: 1+ on the right. 1+ on the left.  Cardiovascular:     Rate and Rhythm: Normal rate and regular rhythm.     Heart sounds: Normal heart sounds, S1 normal and S2 normal.  Pulmonary:     Effort: Pulmonary effort is normal. No respiratory distress.     Breath sounds: Normal breath sounds.  Skin:    General: Skin is warm and dry.  Neurological:     Mental Status: He is alert and oriented to person, place, and time.  Psychiatric:        Behavior: Behavior normal.      Results for orders placed or performed in visit on 03/12/18  HM DIABETES EYE EXAM  Result Value Ref Range   HM Diabetic Eye Exam No Retinopathy No Retinopathy      Assessment & Plan:   Problem List Items Addressed This Visit    None    Visit  Diagnoses    Acute non-recurrent pansinusitis    -  Primary   Relevant Medications   pseudoephedrine-guaifenesin (MUCINEX D) 60-600 MG 12 hr tablet   amoxicillin-clavulanate (AUGMENTIN) 875-125 MG tablet    Consistent with URI and secondary sinusitis with symptoms worsening over the past 7 days and initial symptoms of nasal congestion and sinus pressure over 2 weeks ago.   Plan: 1.START taking Augemntin 875-125 mg tablets every 12 hours for 10 days.  Discussed completing antibiotic. - While on antibiotic, take a probiotic OTC or from food. - Continue anti-histamine loratadine 10mg  daily. - Can use Flonase 2 sprays each nostril daily for up to 4-6 weeks if no epistaxis. - Start Mucinex-DM OTC for  7-10 days prn congestion 2. Supportive care with nasal saline, warm herbal tea with honey, 3. Improve hydration 4. Tylenol / Motrin PRN fevers  5. Return criteria given    Meds  ordered this encounter  Medications  . amoxicillin-clavulanate (AUGMENTIN) 875-125 MG tablet    Sig: Take 1 tablet by mouth 2 (two) times daily for 10 days.    Dispense:  20 tablet    Refill:  0    Order Specific Question:   Supervising Provider    Answer:   Olin Hauser [[id number]]    Follow up plan: Return 5-7 days if symptoms worsen or fail to improve.  Cassell Smiles, DNP, AGPCNP-BC Adult Gerontology Primary Care Nurse Practitioner [facility] 05/12/2018, 2:26 PM

## 2018-05-12 NOTE — Patient Instructions (Addendum)
[name],   Thank you for coming in to clinic today.  1. It sounds like you have a Bacterial sinus infection.  Recommend good hand washing. - START Augmentin 875-125 mg one tablet every 12 hours for 10 days.  Make sure to take all doses of your antibiotic. - While you are on an antibiotic, take a probiotic.  Antibiotics kill good and bad bacteria.  A probiotic helps to replace your good bacteria. Probiotic pills can be found over the counter.  One brand is Florastor, but you can use any brand you prefer.  You can also get good bacteria from foods.  These foods are yogurt, kefir, kombucha, and fresh, refrigerated and uncooked sauerkraut. - Drink plenty of fluids.  - May start anti-histamine loratadine or cetirizine 10mg  daily. - This will help prevent mucous production. - You may also use Flonase 2 sprays each nostril daily for up to 4-6 weeks  Other over the counter medications you may try, if needed for symptoms are: - If congestion is worse, start OTC Mucinex (or may try Mucinex-DM for cough) up to 7-10 days then stop - You may try over the counter Nasal Saline spray (Simply Saline, Ocean Spray) as needed to reduce congestion. - Start taking Tylenol extra strength 1 to 2 tablets every 6-8 hours for aches or fever/chills for next few days as needed.  Do not take more than 3,000 mg in 24 hours from all medicines.  You may also take ibuprofen 200-400mg  every 8 hours as needed.   - Drink warm herbal tea with honey for sore throat.   If symptoms are significantly worse with persistent fevers/chills despite tylenol/ibpurofen, nausea, vomiting unable to tolerate food/fluids or medicine, body aches, or shortness of breath, sinus pain pressure or worsening productive cough, then follow-up for re-evaluation, may seek more immediate care at Urgent Care or the ED if you are more concerned that it is an emergency.  Please schedule a follow-up appointment with Cassell Smiles, AGNP. Return 5-7 days if  symptoms worsen or fail to improve.  If you have any other questions or concerns, please feel free to call the clinic or send a message through [place]. You may also schedule an earlier appointment if necessary.  You will receive a survey after today's visit either digitally by e-mail or paper by [employer]. Your experiences and feedback matter to [place].  Please respond so we know how we are doing as we provide care for you.   Cassell Smiles, DNP, AGNP-BC Adult Gerontology Nurse Practitioner [place]

## 2018-05-28 ENCOUNTER — Encounter: Payer: Self-pay | Admitting: Family Medicine

## 2018-05-28 ENCOUNTER — Ambulatory Visit (INDEPENDENT_AMBULATORY_CARE_PROVIDER_SITE_OTHER): Payer: PPO | Admitting: Family Medicine

## 2018-05-28 VITALS — BP 116/51 | HR 55 | Temp 97.6°F | Resp 16 | Ht 70.0 in | Wt 165.0 lb

## 2018-05-28 DIAGNOSIS — M10072 Idiopathic gout, left ankle and foot: Secondary | ICD-10-CM | POA: Diagnosis not present

## 2018-05-28 DIAGNOSIS — J019 Acute sinusitis, unspecified: Secondary | ICD-10-CM

## 2018-05-28 MED ORDER — IPRATROPIUM BROMIDE 0.06 % NA SOLN: 2.0000 | Freq: Four times a day (QID) | NASAL | 0 refills | Status: DC

## 2018-05-28 MED ORDER — MITIGARE 0.6 MG PO CAPS: ORAL_CAPSULE | ORAL | 0 refills | Status: DC

## 2018-05-28 NOTE — Progress Notes (Signed)
Subjective:    Patient ID: [name], male    DOB: 28-Apr-1934, 85 y.o.   MRN: [id number]  [name] is a 85 y.o. male presenting on 05/28/2018 for Gout (left toe, foot swollen onset 5 days)  Patient presents for a same day appointment.  HPI  GOUT FLARE, Left Toe Reports new symptoms onset 3-5 days with Left great toe and foot localized pain, redness, swelling, he had some tingling initially with known neuropathy. He did not have any injury or accident. No other cause, he has not had gout before, but thinks this may be gout. He admits increased sensitivity to touch in this toe. - He can walk on foot but has pain with ambulation and inc weight bear - History of CKD-III he cannot take NSAIDs - Tried ice and warm water soaks with epsom salt for swelling some relief - Denies any fall, injury, other redness swelling, fever chills, drainage, ulceration  Follow-up Sinusitis Last seen 05/12/18 by Cassell Smiles, AGPCNP-BC for this issue, treated with Augmentin with significant improvement. Now today he has some lingering congestion only. Request something for congestion, he has flonase already.   Depression screen [facility] 2/9 05/28/2018 12/23/2017 12/23/2017  Decreased Interest 0 0 0  Down, Depressed, Hopeless 0 0 0  PHQ - 2 Score 0 0 0  Altered sleeping - - -  Tired, decreased energy - - -  Change in appetite - - -  Feeling bad or failure about yourself  - - -  Trouble concentrating - - -  Moving slowly or fidgety/restless - - -  Suicidal thoughts - - -  PHQ-9 Score - - -    Social History   Tobacco Use  . Smoking status: Former Smoker    Last attempt to quit: 05/27/1978    Years since quitting: 40.0  . Smokeless tobacco: Never Used  Substance Use Topics  . Alcohol use: No    Alcohol/week: 0.0 standard drinks  . Drug use: No    Review of Systems Per HPI unless specifically indicated above     Objective:    BP (!) 116/51   Pulse (!) 55   Temp 97.6 F (36.4 C) (Oral)    Resp 16   Ht 5\' 10"  (1.778 m)   Wt 165 lb (74.8 kg)   SpO2 98%   BMI 23.68 kg/m   Wt Readings from Last 3 Encounters:  05/28/18 165 lb (74.8 kg)  05/12/18 165 lb (74.8 kg)  12/23/17 167 lb 3.2 oz (75.8 kg)    Physical Exam Vitals signs and nursing note reviewed.  Constitutional:      General: He is not in acute distress.    Appearance: He is well-developed. He is not diaphoretic.     Comments: Well-appearing, comfortable, cooperative  HENT:     Head: Normocephalic and atraumatic.  Eyes:     General:        Right eye: No discharge.        Left eye: No discharge.     Conjunctiva/sclera: Conjunctivae normal.  Cardiovascular:     Rate and Rhythm: Normal rate.  Pulmonary:     Effort: Pulmonary effort is normal.  Skin:    General: Skin is warm and dry.     Findings: Erythema (localized L great toe region MTP) present. No rash.     Comments: See picture. Mild tender to touch over edematous erythematous L great toe MTP region, no extension of erythema. No ulceration.  Neurological:  Mental Status: He is alert and oriented to person, place, and time.  Psychiatric:        Behavior: Behavior normal.     Comments: Well groomed, good eye contact, normal speech and thoughts          Results for orders placed or performed in visit on 03/12/18  HM DIABETES EYE EXAM  Result Value Ref Range   HM Diabetic Eye Exam No Retinopathy No Retinopathy      Assessment & Plan:   Problem List Items Addressed This Visit    None    Visit Diagnoses    Acute idiopathic gout involving toe of left foot    -  Primary  Clinically consistent with acute gout flare of Left great toe MTP, onset few days ago No prior known gout flare, he has CKD-III likely etiology Differential Dx - unlikely trauma without known inciting injury, unlikely OA, no sign of infection or systemic symptoms. No uric acid lowering therapy, no lab uric acid level  Plan: 1. Start Mitigare brand colchicine 0.6mg  x 2  for 1 dose then repeat dose in 2 hours - then 0.6mg  daily for up to 7-10 days until resolved - Avoid NSAID due to CKD. Limit future colchicine based on renal function. - Consider future prednisone taper over 7-8 days if not improved within 24 hours - caution w/ diabetes 3. Avoid excessive ambulation, relative rest, ice if helps, can take Tylenol PRN 4. Avoid food triggers - handout given 5. Follow-up if not improved within few weeks sooner if needed  Strict return precautions if spreading redness, if possibility of infection he should seek care at hospital ED or urgent care.    Relevant Medications   MITIGARE 0.6 MG CAPS   Acute rhinosinusitis       Relevant Medications   ipratropium (ATROVENT) 0.06 % nasal spray      Meds ordered this encounter  Medications  . MITIGARE 0.6 MG CAPS    Sig: Start with 2 pills for dose 1.2, repeat 1 pill within 2 hour. Next day start 1 capsule daily (0.6mg ) until symptoms resolve up to 7-10 days    Dispense:  15 capsule    Refill:  0  . ipratropium (ATROVENT) 0.06 % nasal spray    Sig: Place 2 sprays into both nostrils 4 (four) times daily. For up to 5-7 days then stop.    Dispense:  15 mL    Refill:  0      Follow up plan: Return in about 1 week (around 06/04/2018), or if symptoms worsen or fail to improve, for gout.  Nobie Putnam, DO [facility] 05/28/2018, 11:34 AM

## 2018-05-28 NOTE — Patient Instructions (Addendum)
Thank you for coming to the office today.  Your Left Toe pain is most likely caused an Acute Gout Flare - Gout is a chronic problem that will have episodic flare ups with pain, redness, swelling of a joint, most common spots are big toe, foot and ankle, knee or sometimes hands or wrists. It is caused by small crystals made of Uric Acid that form in the joint causing pain and swelling.  Start Colchicine (Mitigare) today - First dose take 2 tabs (1.2mg  total), wait 1 hour then take 1 tab (0.6mg ). Then next day start with 1 tab daily until pain resolves, if after 3 days pain is not resolving then you can increase to 1 tab twice a day until resolved. Continue same dose you were on when pain resolved for about 2-3 days AFTER pain is completely gone.   If pain does not significantly improve within 24 hours - NOTIFY us BY FRIDAY 05/29/18 - we can send Prednisone steroid as well. Finish both medicines.  For all gout flares, the sooner you start the medication, then the shorter the flare lasts. We may have to treat it immediately next time. OR if you don't start medication you can come to the office within 24-48 hours for treatment here.  Gout flares can repeat again soon after they resolve in the same spot or other joints, and may need repeat treatment.  Our goal is to prevent future gout flares. Try to avoid dietary triggers that are the most common causes of gout flares. - Avoid the following foods/drinks: - Red meat, organ meat (liver) - Alcohol (especially beer, also wine, liquor) - Processed foods / carbs (white bread, white rice, pasta, sugar) - Sugary drinks (sweet tea, soda) - Shellfish, shrimp / lobster  - Foods that are preferred to eat: - Beans, Lentils, Whole grains, Quinoa - Fruits, Vegetables - Dairy, Cheese, Yogurt - Soy based protein   Please schedule a Follow-up Appointment to: Return in about 1 week (around 06/04/2018), or if symptoms worsen or fail to improve, for gout.  If you  have any other questions or concerns, please feel free to call the office or send a message through [place]. You may also schedule an earlier appointment if necessary.  Additionally, you may be receiving a survey about your experience at our office within a few days to 1 week by e-mail or mail. We value your feedback.  Nobie Putnam, DO [place]

## 2018-06-08 ENCOUNTER — Other Ambulatory Visit: Payer: Self-pay | Admitting: Family Medicine

## 2018-06-08 DIAGNOSIS — E1142 Type 2 diabetes mellitus with diabetic polyneuropathy: Secondary | ICD-10-CM

## 2018-06-25 ENCOUNTER — Ambulatory Visit: Payer: Medicare Other | Admitting: Family Medicine

## 2018-06-26 ENCOUNTER — Encounter: Payer: Self-pay | Admitting: Family Medicine

## 2018-06-26 ENCOUNTER — Ambulatory Visit (INDEPENDENT_AMBULATORY_CARE_PROVIDER_SITE_OTHER): Payer: PPO | Admitting: Family Medicine

## 2018-06-26 VITALS — BP 113/58 | HR 52 | Temp 97.6°F | Resp 16 | Ht 70.0 in | Wt 164.0 lb

## 2018-06-26 DIAGNOSIS — E1142 Type 2 diabetes mellitus with diabetic polyneuropathy: Secondary | ICD-10-CM

## 2018-06-26 DIAGNOSIS — L821 Other seborrheic keratosis: Secondary | ICD-10-CM

## 2018-06-26 DIAGNOSIS — L820 Inflamed seborrheic keratosis: Secondary | ICD-10-CM | POA: Diagnosis not present

## 2018-06-26 DIAGNOSIS — N183 Chronic kidney disease, stage 3 unspecified: Secondary | ICD-10-CM

## 2018-06-26 DIAGNOSIS — I129 Hypertensive chronic kidney disease with stage 1 through stage 4 chronic kidney disease, or unspecified chronic kidney disease: Secondary | ICD-10-CM

## 2018-06-26 DIAGNOSIS — G63 Polyneuropathy in diseases classified elsewhere: Secondary | ICD-10-CM | POA: Diagnosis not present

## 2018-06-26 LAB — POCT GLYCOSYLATED HEMOGLOBIN (HGB A1C): Hemoglobin A1C: 6.9 % — AB (ref 4.0–5.6)

## 2018-06-26 MED ORDER — ALPHA LIPOIC ACID 200 MG PO CAPS: 200.0000 mg | ORAL_CAPSULE | Freq: Three times a day (TID) | ORAL | 0 refills | Status: DC

## 2018-06-26 NOTE — Patient Instructions (Addendum)
Thank you for coming to the office today.  Recent Labs    12/16/17 0940 06/26/18 0815  HGBA1C 7.6* 6.9*   Impoved sugar. Keep up the current meds. No change.  Start Alpha Lipoic Acid 200mg  capsules - you can take 1 of them 3 times a day for daily dose of 600mg  Supplement for nerve health  ---------  Referral to Dermatology  [facility]   [address], [place] [address] Hours: 8AM-5PM Phone: [phone]  Sarina Ser, MD  DUE for FASTING BLOOD WORK (no food or drink after midnight before the lab appointment, only water or coffee without cream/sugar on the morning of)  SCHEDULE "Lab Only" visit in the morning at the clinic for lab draw in 6 MONTHS   - Make sure Lab Only appointment is at about 1 week before your next appointment, so that results will be available  For Lab Results, once available within 2-3 days of blood draw, you can can log in to MyChart online to view your results and a brief explanation. Also, we can discuss results at next follow-up visit.   Please schedule a Follow-up Appointment to: Return in about 6 months (around 12/25/2018) for Annual Physical.  If you have any other questions or concerns, please feel free to call the office or send a message through [place]. You may also schedule an earlier appointment if necessary.  Additionally, you may be receiving a survey about your experience at our office within a few days to 1 week by e-mail or mail. We value your feedback.  Nobie Putnam, DO [place]

## 2018-06-26 NOTE — Assessment & Plan Note (Signed)
Well controlled HTN Complication: CKD-III  Plan: 1. CONTINUE Lisinopril 10mg  daily 2. Improve hydration, avoid NSAIDs may use Tylenol and topical NSAID, lifestyle regular exercise, low sodium 3. Monitor BP outside office if he can occasionally 4. Follow-up 6 months annual

## 2018-06-26 NOTE — Assessment & Plan Note (Signed)
Significantly improved H8V to 6.9 Complications - CKD-III, peripheral neuropathy, other including hyperlipidemia specifically hypertriglyceridemia - increases risk of future cardiovascular complications    Plan:  1. Continue current therapy - Metformin XR 500mg  2. Encourage improved lifestyle - low carb, low sugar diet, reduce portion size, continue improving regular exercise 3. Continue ACEi 4. Continue Gabapentin 1200 TID for now, still consider future Lyrica - advised caution with new med more risk side effect - would unlikely help his reduced sensation. - Start Alpha Lipoic Acid 200mg  TID 5. F/u with Dr Ellin [facility] 6. Follow-up 6 months for DM A1c

## 2018-06-26 NOTE — Assessment & Plan Note (Signed)
See A&P for DM with neuropathy Complication of DM Controlled on Gabapentin - discussed possible lyrica would not address sensation but may reduce pain - he prefers to avoid new med now  Add Alpha Lipoic Acid

## 2018-06-26 NOTE — Progress Notes (Signed)
Subjective:    Patient ID: [name], male    DOB: 03/24/1934, 85 y.o.   MRN: [id number]  [name] is a 85 y.o. male presenting on 06/26/2018 for Diabetes   HPI   CHRONIC DM, Type 2 with DM Neuropathy: A1c improved from >7 down to 6.9. CBGs:Checks fasting AM CBG 1-3 x weekly - avg 100-120. No low of < 100 Meds: Metformin XR 559m daily Reports good compliance. Tolerating well w/o side-effects Currently on ACEi - he is asking about dose Lifestyle: - Diet (Improving diet, eats regular meal, more fruits) - Exercise (Occasionalwalkingand treadmill / stair stepper,and 3 x weekly at community with light weights and exercise program- he even helped lead the exercise program recently. Also some Tai Chi) Dr WPhoebe SharpsExam in past - For neuropathy, still taking Gabapentin 12049mTID- seems to help his symptoms, without side effects, still has some refractory symptoms again only with stinging and burning but has reduced sensation affecting his balance. Denies hypoglycemia, polyuria, visual changes  Difficulty Hearing / Possible Cerumen He reports gradual worsening hearing, uncertain which side. He feels gradual hearing loss  CHRONIC HTN / CKD-III Reports no new concerns. Checks BP outside office occasionally has good readings. Current Meds - Lisinopril 1073maily  - asking if he can stop this med. Reports good compliance, took meds today. Tolerating well, w/o complaints. Denies CP, dyspnea, HA, edema, dizziness / lightheadedness  Seborrheic Keratoses Reports multiple skin lesions that have grown in size, some of them are irritated like one on back he has scratched before. Denies any pain, tingling, bleeding ulceration or other skin changes. He asks for Derm to remove or freeze these.  [facility] 06/26/2018 05/28/2018 12/23/2017  Decreased Interest 0 0 0  Down, Depressed, Hopeless 0 0 0  PHQ - 2 Score 0 0 0  Altered sleeping - - -  Tired, decreased  energy - - -  Change in appetite - - -  Feeling bad or failure about yourself  - - -  Trouble concentrating - - -  Moving slowly or fidgety/restless - - -  Suicidal thoughts - - -  PHQ-9 Score - - -    Social History   Tobacco Use  . Smoking status: Former Smoker    Last attempt to quit: 05/27/1978    Years since quitting: 40.1  . Smokeless tobacco: Never Used  Substance Use Topics  . Alcohol use: No    Alcohol/week: 0.0 standard drinks  . Drug use: No    Review of Systems Per HPI unless specifically indicated above     Objective:    BP (!) 113/58   Pulse (!) 52   Temp 97.6 F (36.4 C) (Oral)   Resp 16   Ht '5\' 10"'  (1.778 m)   Wt 164 lb (74.4 kg)   BMI 23.53 kg/m   Wt Readings from Last 3 Encounters:  06/26/18 164 lb (74.4 kg)  05/28/18 165 lb (74.8 kg)  05/12/18 165 lb (74.8 kg)    Physical Exam Vitals signs and nursing note reviewed.  Constitutional:      General: He is not in acute distress.    Appearance: He is well-developed. He is not diaphoretic.     Comments: Well-appearing, comfortable, cooperative  HENT:     Head: Normocephalic and atraumatic.  Eyes:     General:        Right eye: No discharge.        Left eye: No  discharge.     Conjunctiva/sclera: Conjunctivae normal.  Neck:     Musculoskeletal: Normal range of motion and neck supple.     Thyroid: No thyromegaly.  Cardiovascular:     Rate and Rhythm: Normal rate and regular rhythm.     Heart sounds: Normal heart sounds. No murmur.  Pulmonary:     Effort: Pulmonary effort is normal. No respiratory distress.     Breath sounds: Normal breath sounds. No wheezing or rales.  Musculoskeletal: Normal range of motion.  Lymphadenopathy:     Cervical: No cervical adenopathy.  Skin:    General: Skin is warm and dry.     Findings: No erythema or rash.     Comments: Several typical brown stuck on appearing round raised skin lesions x 2 on R forehead, x 1-2 on R forearm, one large >2-3 cm on mid back    Neurological:     Mental Status: He is alert and oriented to person, place, and time.  Psychiatric:        Behavior: Behavior normal.     Comments: Well groomed, good eye contact, normal speech and thoughts    Results for orders placed or performed in visit on 06/26/18  POCT glycosylated hemoglobin (Hb A1C)  Result Value Ref Range   Hemoglobin A1C 6.9 (A) 4.0 - 5.6 %      Assessment & Plan:   Problem List Items Addressed This Visit    Benign hypertension with CKD (chronic kidney disease) stage III ([place])    Well controlled HTN Complication: CKD-III  Plan: 1. CONTINUE Lisinopril 72m daily 2. Improve hydration, avoid NSAIDs may use Tylenol and topical NSAID, lifestyle regular exercise, low sodium 3. Monitor BP outside office if he can occasionally 4. Follow-up 6 months annual      Peripheral neuropathy    See A&P for DM with neuropathy Complication of DM Controlled on Gabapentin - discussed possible lyrica would not address sensation but may reduce pain - he prefers to avoid new med now  Add Alpha Lipoic Acid      Relevant Medications   Alpha Lipoic Acid 200 MG CAPS   Type 2 diabetes mellitus with diabetic polyneuropathy ([place] - Primary    Significantly improved AB0Jto 6.9 Complications - CKD-III, peripheral neuropathy, other including hyperlipidemia specifically hypertriglyceridemia - increases risk of future cardiovascular complications    Plan:  1. Continue current therapy - Metformin XR 5086m2. Encourage improved lifestyle - low carb, low sugar diet, reduce portion size, continue improving regular exercise 3. Continue ACEi 4. Continue Gabapentin 1200 TID for now, still consider future Lyrica - advised caution with new med more risk side effect - would unlikely help his reduced sensation. - Start Alpha Lipoic Acid 20066mID 5. F/u with Dr WooEllin Mayhewe 6. Follow-up 6 months for DM A1c      Relevant Orders   POCT glycosylated hemoglobin (Hb A1C) (Completed)     Other Visit Diagnoses    SK (seborrheic keratosis)       Relevant Orders   Ambulatory referral to Dermatology   Keratosis, inflamed seborrheic       Relevant Orders   Ambulatory referral to Dermatology        Meds ordered this encounter  Medications  . Alpha Lipoic Acid 200 MG CAPS    Sig: Take 200 mg by mouth 3 (three) times daily.    Dispense:  90 capsule    Refill:  0   Orders Placed This Encounter  Procedures  .  Ambulatory referral to Dermatology    Referral Priority:   Routine    Referral Type:   Consultation    Referral Reason:   Specialty Services Required    Referred to Provider:   Ralene Bathe, MD    Requested Specialty:   Dermatology    Number of Visits Requested:   1  . POCT glycosylated hemoglobin (Hb A1C)     Follow up plan: Return in about 6 months (around 12/25/2018) for Annual Physical.  Future labs ordered for 12/22/2018  Nobie Putnam, [facility] 06/26/2018, 8:01 AM

## 2018-06-29 ENCOUNTER — Other Ambulatory Visit: Payer: Self-pay | Admitting: Family Medicine

## 2018-06-29 DIAGNOSIS — E785 Hyperlipidemia, unspecified: Secondary | ICD-10-CM

## 2018-06-29 DIAGNOSIS — M159 Polyosteoarthritis, unspecified: Secondary | ICD-10-CM

## 2018-06-29 DIAGNOSIS — E1169 Type 2 diabetes mellitus with other specified complication: Secondary | ICD-10-CM

## 2018-06-29 DIAGNOSIS — N183 Chronic kidney disease, stage 3 (moderate): Principal | ICD-10-CM

## 2018-06-29 DIAGNOSIS — Z8546 Personal history of malignant neoplasm of prostate: Secondary | ICD-10-CM

## 2018-06-29 DIAGNOSIS — I129 Hypertensive chronic kidney disease with stage 1 through stage 4 chronic kidney disease, or unspecified chronic kidney disease: Secondary | ICD-10-CM

## 2018-06-29 DIAGNOSIS — M150 Primary generalized (osteo)arthritis: Secondary | ICD-10-CM

## 2018-06-29 DIAGNOSIS — G63 Polyneuropathy in diseases classified elsewhere: Secondary | ICD-10-CM

## 2018-06-29 DIAGNOSIS — E1142 Type 2 diabetes mellitus with diabetic polyneuropathy: Secondary | ICD-10-CM

## 2018-08-24 ENCOUNTER — Other Ambulatory Visit: Payer: Self-pay | Admitting: Family Medicine

## 2018-08-24 DIAGNOSIS — E1142 Type 2 diabetes mellitus with diabetic polyneuropathy: Secondary | ICD-10-CM

## 2018-10-21 ENCOUNTER — Telehealth: Payer: Self-pay

## 2018-10-21 NOTE — Telephone Encounter (Signed)
Persistent Sore throat w/ no improvement x 3 days. Complains of some difficulty with swallowing, post nasal drainage, and runny nose. He denies coughing, SOB, fever and chills. Pt currently taking otc medication Mucinex every 12 hrs and Ibuprofen 400MG  once daily. We have no appt available for today or tomorrow. The pt want to know if there is something else you can recommend for him to do to help with the discomfort. Please advise

## 2018-10-21 NOTE — Telephone Encounter (Signed)
I would recommend sinus treatment OTC to help reduce drainage - he should add on allergy medication such as a Claritin or Zyrtec once daily to help dry up drainage. Most likely the drainage is causing any swallowing issue or throat issue.  He should also try a nasal spray OTC - can do Flonase or similar medication daily for up to 2-4 weeks to help reduce post nasal drainage.  If not improved within next 1 week then he can schedule virtual visit follow-up with me  Nobie Putnam, [facility] 10/21/2018, 9:53 AM

## 2018-10-21 NOTE — Telephone Encounter (Signed)
The pt was notified. No questions or concerns. 

## 2018-10-21 NOTE — Telephone Encounter (Signed)
Attempted to contact the pt back, no answer. LMOM

## 2018-12-22 ENCOUNTER — Other Ambulatory Visit: Payer: PPO

## 2018-12-22 ENCOUNTER — Other Ambulatory Visit: Payer: Self-pay

## 2018-12-22 DIAGNOSIS — I129 Hypertensive chronic kidney disease with stage 1 through stage 4 chronic kidney disease, or unspecified chronic kidney disease: Secondary | ICD-10-CM | POA: Diagnosis not present

## 2018-12-22 DIAGNOSIS — E785 Hyperlipidemia, unspecified: Secondary | ICD-10-CM | POA: Diagnosis not present

## 2018-12-22 DIAGNOSIS — E1169 Type 2 diabetes mellitus with other specified complication: Secondary | ICD-10-CM

## 2018-12-22 DIAGNOSIS — E1142 Type 2 diabetes mellitus with diabetic polyneuropathy: Secondary | ICD-10-CM | POA: Diagnosis not present

## 2018-12-22 DIAGNOSIS — Z8546 Personal history of malignant neoplasm of prostate: Secondary | ICD-10-CM

## 2018-12-22 DIAGNOSIS — G63 Polyneuropathy in diseases classified elsewhere: Secondary | ICD-10-CM

## 2018-12-23 LAB — COMPLETE METABOLIC PANEL WITH GFR
AG Ratio: 1.6 (calc) (ref 1.0–2.5)
ALT: 14 U/L (ref 9–46)
AST: 22 U/L (ref 10–35)
Albumin: 4.6 g/dL (ref 3.6–5.1)
Alkaline phosphatase (APISO): 44 U/L (ref 35–144)
BUN/Creatinine Ratio: 18 (calc) (ref 6–22)
BUN: 25 mg/dL (ref 7–25)
CO2: 29 mmol/L (ref 20–32)
Calcium: 10 mg/dL (ref 8.6–10.3)
Chloride: 99 mmol/L (ref 98–110)
Creat: 1.41 mg/dL — ABNORMAL HIGH (ref 0.70–1.11)
GFR, Est African American: 53 mL/min/{1.73_m2} — ABNORMAL LOW (ref >=60)
GFR, Est Non African American: 46 mL/min/{1.73_m2} — ABNORMAL LOW (ref >=60)
Globulin: 2.8 g/dL (calc) (ref 1.9–3.7)
Glucose, Bld: 112 mg/dL — ABNORMAL HIGH (ref 65–99)
Potassium: 4.4 mmol/L (ref 3.5–5.3)
Sodium: 137 mmol/L (ref 135–146)
Total Bilirubin: 0.5 mg/dL (ref 0.2–1.2)
Total Protein: 7.4 g/dL (ref 6.1–8.1)

## 2018-12-23 LAB — LIPID PANEL
Cholesterol: 217 mg/dL — ABNORMAL HIGH (ref <200)
HDL: 35 mg/dL — ABNORMAL LOW (ref >=40)
LDL Cholesterol (Calc): 139 mg/dL (calc) — ABNORMAL HIGH
Non-HDL Cholesterol (Calc): 182 mg/dL (calc) — ABNORMAL HIGH (ref <130)
Total CHOL/HDL Ratio: 6.2 (calc) — ABNORMAL HIGH (ref <5.0)
Triglycerides: 279 mg/dL — ABNORMAL HIGH (ref <150)

## 2018-12-23 LAB — CBC WITH DIFFERENTIAL/PLATELET
Absolute Monocytes: 618 cells/uL (ref 200–950)
Basophils Absolute: 71 cells/uL (ref 0–200)
Basophils Relative: 1 %
Eosinophils Absolute: 163 cells/uL (ref 15–500)
Eosinophils Relative: 2.3 %
HCT: 48.5 % (ref 38.5–50.0)
Hemoglobin: 17.1 g/dL (ref 13.2–17.1)
Lymphs Abs: 2925 cells/uL (ref 850–3900)
MCH: 30.6 pg (ref 27.0–33.0)
MCHC: 35.3 g/dL (ref 32.0–36.0)
MCV: 86.9 fL (ref 80.0–100.0)
MPV: 10.4 fL (ref 7.5–12.5)
Monocytes Relative: 8.7 %
Neutro Abs: 3323 cells/uL (ref 1500–7800)
Neutrophils Relative %: 46.8 %
Platelets: 148 10*3/uL (ref 140–400)
RBC: 5.58 10*6/uL (ref 4.20–5.80)
RDW: 13.5 % (ref 11.0–15.0)
Total Lymphocyte: 41.2 %
WBC: 7.1 10*3/uL (ref 3.8–10.8)

## 2018-12-23 LAB — HEMOGLOBIN A1C
Hgb A1c MFr Bld: 6.5 % of total Hgb — ABNORMAL HIGH (ref <5.7)
Mean Plasma Glucose: 140 (calc)
eAG (mmol/L): 7.7 (calc)

## 2018-12-23 LAB — PSA: PSA: <0.1 ng/mL (ref <=4.0)

## 2018-12-29 ENCOUNTER — Other Ambulatory Visit: Payer: Self-pay

## 2018-12-29 ENCOUNTER — Ambulatory Visit (INDEPENDENT_AMBULATORY_CARE_PROVIDER_SITE_OTHER): Payer: PPO | Admitting: Family Medicine

## 2018-12-29 ENCOUNTER — Ambulatory Visit (INDEPENDENT_AMBULATORY_CARE_PROVIDER_SITE_OTHER): Payer: PPO

## 2018-12-29 ENCOUNTER — Encounter: Payer: Self-pay | Admitting: Family Medicine

## 2018-12-29 VITALS — BP 133/83 | HR 83 | Temp 98.5°F | Resp 16 | Ht 70.0 in | Wt 165.2 lb

## 2018-12-29 VITALS — BP 133/83 | HR 83 | Temp 98.5°F | Resp 15 | Ht 70.0 in | Wt 165.2 lb

## 2018-12-29 DIAGNOSIS — Z0000 Encounter for general adult medical examination without abnormal findings: Secondary | ICD-10-CM

## 2018-12-29 DIAGNOSIS — N183 Chronic kidney disease, stage 3 (moderate): Secondary | ICD-10-CM

## 2018-12-29 DIAGNOSIS — E1142 Type 2 diabetes mellitus with diabetic polyneuropathy: Secondary | ICD-10-CM | POA: Diagnosis not present

## 2018-12-29 DIAGNOSIS — G63 Polyneuropathy in diseases classified elsewhere: Secondary | ICD-10-CM

## 2018-12-29 DIAGNOSIS — E785 Hyperlipidemia, unspecified: Secondary | ICD-10-CM | POA: Diagnosis not present

## 2018-12-29 DIAGNOSIS — I129 Hypertensive chronic kidney disease with stage 1 through stage 4 chronic kidney disease, or unspecified chronic kidney disease: Secondary | ICD-10-CM

## 2018-12-29 DIAGNOSIS — E1169 Type 2 diabetes mellitus with other specified complication: Secondary | ICD-10-CM | POA: Diagnosis not present

## 2018-12-29 MED ORDER — ZETIA 10 MG PO TABS: 10.0000 mg | ORAL_TABLET | Freq: Every day | ORAL | 3 refills | Status: DC

## 2018-12-29 MED ORDER — LISINOPRIL 5 MG PO TABS: 5.0000 mg | ORAL_TABLET | Freq: Every day | ORAL | 3 refills | Status: DC

## 2018-12-29 NOTE — Progress Notes (Signed)
Subjective:    Patient ID: [name], male    DOB: [birth year], 84 y.o.   MRN: [id number]  [name] is a 85 y.o. male presenting on 12/29/2018 for Annual Exam (medicare physical)   HPI   Here for Annual Physical and Lab Review. He has already completed AMW with [facility] today.  CHRONIC DM, Type 2 with DM Neuropathy: Last lab A1c down from 6.9 down to 6.5 He is doing well CBGs:Checks fasting AM CBG 1-3 x weekly -avg 100-120. No low of < 100 Meds: Metformin XR 500mg  daily Reports good compliance. Tolerating well w/o side-effects Currently on ACEi - he is asking about dose Lifestyle: - Diet (Improved diet, more fruit vegetables, low carb, limiting sweets) - Exercise (still regular exercise regimen, walking and home exercise regimen) Dr Phoebe Sharps Exam in past - For neuropathy, still taking Gabapentin 1200mg  TID- seems tohelphis symptoms, without side effects, still has some refractory symptomsagain only with stinging and burning but has reduced sensation affecting his balance. Denies hypoglycemia, polyuria, visual changes  CHRONIC HTN/ CKD-III Recent update - he had low BP 80s/50s, he felt tired and fatigued, he started improving hydration with water and his BP improved up back to normal and he felt better. Current Meds -Lisinopril 10mg  daily- asking if he can reduce med Reports good compliance, took meds today. Tolerating well, w/o complaints. Denies CP, dyspnea, HA, edema, dizziness / lightheadedness  HYPERLIPIDEMIA: - Reports no concerns. Last lipid panel 11/2018, elevated LDL, - OFF Zetia, stopped taking unsure why   Health Maintenance:  UTD Vaccines, Pneumonia vaccines UTD TDap   Depression screen [facility] 2/9 12/29/2018 06/26/2018 05/28/2018  Decreased Interest 0 0 0  Down, Depressed, Hopeless 0 0 0  PHQ - 2 Score 0 0 0  Altered sleeping - - -  Tired, decreased energy - - -  Change in appetite - - -  Feeling bad or failure about yourself  -  - -  Trouble concentrating - - -  Moving slowly or fidgety/restless - - -  Suicidal thoughts - - -  PHQ-9 Score - - -    Past Medical History:  Diagnosis Date   Diabetes mellitus without complication ([place])    Hyperlipidemia    Hypertension    Neuropathy    Prostate cancer [facility])    Past Surgical History:  Procedure Laterality Date   CATARACT EXTRACTION W/PHACO Left 04/24/2015   Procedure: CATARACT EXTRACTION PHACO AND INTRAOCULAR LENS PLACEMENT ([place]);  Surgeon: Estill Cotta, MD;  Location: [facility] ORS;  Service: Ophthalmology;  Laterality: Left;  [place] 01:12 AP% 25.7 CDE 28.30 fluid pack lot #[id number]   COLONOSCOPY     Social History   Socioeconomic History   Marital status: Widowed    Spouse name: Not on file   Number of children: Not on file   Years of education: Not on file   Highest education level: High school graduate  Occupational History   Not on file  Social Needs   Financial resource strain: Not hard at all   Food insecurity    Worry: Never true    Inability: Never true   Transportation needs    Medical: No    Non-medical: No  Tobacco Use   Smoking status: Former Smoker    Quit date: 05/27/1978    Years since quitting: 40.6   Smokeless tobacco: Never Used  Substance and Sexual Activity   Alcohol use: No    Alcohol/week: 0.0 standard drinks   Drug  use: No   Sexual activity: Not on file  Lifestyle   Physical activity    Days per week: 3 days    Minutes per session: 20 min   Stress: Not at all  Relationships   Social connections    Talks on phone: Twice a week    Gets together: More than three times a week    Attends religious service: More than 4 times per year    Active member of club or organization: Yes    Attends meetings of clubs or organizations: More than 4 times per year    Relationship status: Widowed   Intimate partner violence    Fear of current or ex partner: No    Emotionally abused: No    Physically abused:  No    Forced sexual activity: No  Other Topics Concern   Not on file  Social History Narrative   Lives in retirement community    Family History  Problem Relation Age of Onset   Stroke Mother    Cancer Mother        colon cancer   COPD Father    COPD Brother    Hearing loss Son    Current Outpatient Medications on File Prior to Visit  Medication Sig   Alpha Lipoic Acid 200 MG CAPS Take 200 mg by mouth 3 (three) times daily.   DUREZOL 0.05 % EMUL Place 4.03 application into the left eye as needed.   gabapentin (NEURONTIN) 600 MG tablet Take 2 tablets (1,200 mg total) by mouth 3 (three) times daily.   glucosamine-chondroitin 500-400 MG tablet Take 1 tablet by mouth 3 (three) times daily as needed.    metFORMIN (GLUCOPHAGE-XR) 500 MG 24 hr tablet Take 2 tablets (1,000 mg total) by mouth daily with breakfast.   VOLTAREN 1 % GEL 4 GRAMS, Transdermal, four times daily, as needed   No current facility-administered medications on file prior to visit.     Review of Systems  Constitutional: Negative for activity change, appetite change, chills, diaphoresis, fatigue and fever.  HENT: Negative for congestion and hearing loss.   Eyes: Negative for visual disturbance.  Respiratory: Negative for apnea, cough, choking, chest tightness, shortness of breath and wheezing.   Cardiovascular: Negative for chest pain, palpitations and leg swelling.  Gastrointestinal: Negative for abdominal pain, anal bleeding, blood in stool, constipation, diarrhea, nausea and vomiting.  Endocrine: Negative for cold intolerance.  Genitourinary: Negative for difficulty urinating, dysuria, frequency and hematuria.  Musculoskeletal: Negative for arthralgias, back pain and neck pain.  Skin: Negative for rash.  Allergic/Immunologic: Negative for environmental allergies.  Neurological: Negative for dizziness, weakness, light-headedness, numbness and headaches.  Hematological: Negative for adenopathy.    Psychiatric/Behavioral: Negative for behavioral problems, dysphoric mood and sleep disturbance. The patient is not nervous/anxious.    Per HPI unless specifically indicated above      Objective:    BP 133/83    Pulse 83    Temp 98.5 F (36.9 C) (Oral)    Resp 16    Ht 5\' 10"  (1.778 m)    Wt 165 lb 3.2 oz (74.9 kg)    BMI 23.70 kg/m   Wt Readings from Last 3 Encounters:  12/29/18 165 lb 3.2 oz (74.9 kg)  12/29/18 165 lb 3.2 oz (74.9 kg)  06/26/18 164 lb (74.4 kg)    Physical Exam Vitals signs and nursing note reviewed.  Constitutional:      General: He is not in acute distress.    Appearance:  He is well-developed. He is not diaphoretic.     Comments: Well-appearing, comfortable, cooperative  HENT:     Head: Normocephalic and atraumatic.  Eyes:     General:        Right eye: No discharge.        Left eye: No discharge.     Conjunctiva/sclera: Conjunctivae normal.     Pupils: Pupils are equal, round, and reactive to light.  Neck:     Musculoskeletal: Normal range of motion and neck supple.     Thyroid: No thyromegaly.     Comments: No carotid bruits Cardiovascular:     Rate and Rhythm: Normal rate and regular rhythm.     Heart sounds: Normal heart sounds. No murmur.  Pulmonary:     Effort: Pulmonary effort is normal. No respiratory distress.     Breath sounds: Normal breath sounds. No wheezing or rales.  Abdominal:     General: Bowel sounds are normal. There is no distension.     Palpations: Abdomen is soft. There is no mass.     Tenderness: There is no abdominal tenderness.  Musculoskeletal: Normal range of motion.        General: No tenderness.     Comments: Upper / Lower Extremities: - Normal muscle tone, strength bilateral upper extremities 5/5, lower extremities 5/5  Lymphadenopathy:     Cervical: No cervical adenopathy.  Skin:    General: Skin is warm and dry.     Findings: No erythema or rash.  Neurological:     Mental Status: He is alert and oriented to  person, place, and time.     Comments: Distal sensation intact to light touch all extremities  Psychiatric:        Behavior: Behavior normal.     Comments: Well groomed, good eye contact, normal speech and thoughts      Diabetic Foot Exam - Simple   Simple Foot Form Diabetic Foot exam was performed with the following findings: Yes 12/29/2018 10:38 AM  Visual Inspection No deformities, no ulcerations, no other skin breakdown bilaterally: Yes Sensation Testing See comments: Yes Pulse Check Posterior Tibialis and Dorsalis pulse intact bilaterally: Yes Comments Reduced sensation to monofilament R worse than L. R great toe and heel worse, otherwise mostly intact.    Recent Labs    06/26/18 0815 12/22/18 0805  HGBA1C 6.9* 6.5*     Results for orders placed or performed in visit on 12/22/18  PSA  Result Value Ref Range   PSA <0.1 < OR = 4.0 ng/mL  Lipid panel  Result Value Ref Range   Cholesterol 217 (H) <200 mg/dL   HDL 35 (L) > OR = 40 mg/dL   Triglycerides 279 (H) <150 mg/dL   LDL Cholesterol (Calc) 139 (H) mg/dL (calc)   Total CHOL/HDL Ratio 6.2 (H) <5.0 (calc)   Non-HDL Cholesterol (Calc) 182 (H) <130 mg/dL (calc)  COMPLETE METABOLIC PANEL WITH GFR  Result Value Ref Range   Glucose, Bld 112 (H) 65 - 99 mg/dL   BUN 25 7 - 25 mg/dL   Creat 1.41 (H) 0.70 - 1.11 mg/dL   GFR, Est Non African American 46 (L) > OR = 60 mL/min/1.53m2   GFR, Est African American 53 (L) > OR = 60 mL/min/1.70m2   BUN/Creatinine Ratio 18 6 - 22 (calc)   Sodium 137 135 - 146 mmol/L   Potassium 4.4 3.5 - 5.3 mmol/L   Chloride 99 98 - 110 mmol/L   CO2 29 20 -  32 mmol/L   Calcium 10.0 8.6 - 10.3 mg/dL   Total Protein 7.4 6.1 - 8.1 g/dL   Albumin 4.6 3.6 - 5.1 g/dL   Globulin 2.8 1.9 - 3.7 g/dL (calc)   AG Ratio 1.6 1.0 - 2.5 (calc)   Total Bilirubin 0.5 0.2 - 1.2 mg/dL   Alkaline phosphatase (APISO) 44 35 - 144 U/L   AST 22 10 - 35 U/L   ALT 14 9 - 46 U/L  CBC with Differential/Platelet    Result Value Ref Range   WBC 7.1 3.8 - 10.8 Thousand/uL   RBC 5.58 4.20 - 5.80 Million/uL   Hemoglobin 17.1 13.2 - 17.1 g/dL   HCT 48.5 38.5 - 50.0 %   MCV 86.9 80.0 - 100.0 fL   MCH 30.6 27.0 - 33.0 pg   MCHC 35.3 32.0 - 36.0 g/dL   RDW 13.5 11.0 - 15.0 %   Platelets 148 140 - 400 Thousand/uL   MPV 10.4 7.5 - 12.5 fL   Neutro Abs 3,323 1,500 - 7,800 cells/uL   Lymphs Abs 2,925 850 - 3,900 cells/uL   Absolute Monocytes 618 200 - 950 cells/uL   Eosinophils Absolute 163 15 - 500 cells/uL   Basophils Absolute 71 0 - 200 cells/uL   Neutrophils Relative % 46.8 %   Total Lymphocyte 41.2 %   Monocytes Relative 8.7 %   Eosinophils Relative 2.3 %   Basophils Relative 1.0 %  Hemoglobin A1c  Result Value Ref Range   Hgb A1c MFr Bld 6.5 (H) <5.7 % of total Hgb   Mean Plasma Glucose 140 (calc)   eAG (mmol/L) 7.7 (calc)      Assessment & Plan:   Problem List Items Addressed This Visit    Benign hypertension with CKD (chronic kidney disease) stage III (HCC)    Well controlled HTN - except rare low readings, due to hydration it seems Complication: CKD-III  Plan: 1. REDUCE Lisinopril from 10 to 5mg  daily, new rx sent 2. Improve hydration, avoid NSAIDs may use Tylenol and topical NSAID, lifestyle regular exercise, low sodium 3. Monitor BP outside office if he can occasionally 4. Follow-up 6 months      Relevant Medications   lisinopril (ZESTRIL) 5 MG tablet   ZETIA 10 MG tablet   Hyperlipidemia associated with type 2 diabetes mellitus (HCC)    Elevated Lipids off zetia Last lipid panel 11/2018 - elevated TG and LDL Calculated ASCVD 10 yr risk score elevated History of statin intolerance  Plan: 1. RESTART Zetia 10mg  - defer future statin 2. Encourage improved lifestyle - low carb/cholesterol, reduce portion size, continue improving regular exercise  Lipids in 6-12 months      Relevant Medications   lisinopril (ZESTRIL) 5 MG tablet   ZETIA 10 MG tablet   Polyneuropathy  associated with underlying disease ([place])    See A&P for DM with neuropathy Complication of DM Controlled on Gabapentin - discussed possible lyrica would not address sensation but may reduce pain - he prefers to avoid new med now  Continue alpha lipoic acid if helpful      Type 2 diabetes mellitus with diabetic polyneuropathy (HCC)    Significantly improved A1c to 6.5, well controlled Complications - CKD-III, peripheral neuropathy, other including hyperlipidemia specifically hypertriglyceridemia - increases risk of future cardiovascular complications    Plan:  1. Continue current therapy - Metformin XR 500mg  may reduce dose in future. 2. Encourage improved lifestyle - low carb, low sugar diet, reduce portion size, continue improving  regular exercise 3. Continue ACEi 4. Continue Gabapentin 1200 TID for now, still consider future Lyrica - advised caution with new med more risk side effect - would unlikely help his reduced sensation. - Start Alpha Lipoic Acid 200mg  TID 5. F/u with Dr Phoebe Sharps 02/2019 DM Foot exam done today 6. Follow-up 6 months for DM A1c      Relevant Medications   lisinopril (ZESTRIL) 5 MG tablet    Other Visit Diagnoses    Annual physical exam    -  Primary     Updated Health Maintenance information Reviewed recent lab results with patient Encouraged improvement to lifestyle with diet and exercise - Goal maintain healthy wt / loss    Meds ordered this encounter  Medications   lisinopril (ZESTRIL) 5 MG tablet    Sig: Take 1 tablet (5 mg total) by mouth daily.    Dispense:  90 tablet    Refill:  3    Dose reduce from 10 to 5mg    ZETIA 10 MG tablet    Sig: Take 1 tablet (10 mg total) by mouth at bedtime.    Dispense:  90 tablet    Refill:  3     Follow up plan: Return in about 6 months (around 07/01/2019) for 6 months for DM A1c, HTN.  Nobie Putnam, [facility] 12/29/2018, 10:30 AM

## 2018-12-29 NOTE — Assessment & Plan Note (Signed)
Elevated Lipids off zetia Last lipid panel 11/2018 - elevated TG and LDL Calculated ASCVD 10 yr risk score elevated History of statin intolerance  Plan: 1. RESTART Zetia 10mg  - defer future statin 2. Encourage improved lifestyle - low carb/cholesterol, reduce portion size, continue improving regular exercise  Lipids in 6-12 months

## 2018-12-29 NOTE — Progress Notes (Signed)
Subjective:   MACKINLEY KIEHN is a 85 y.o. male who presents for Medicare Annual/Subsequent preventive examination.  Review of Systems:   Cardiac Risk Factors include: hypertension;advanced age (>67men, >39 women);male gender;diabetes mellitus;dyslipidemia     Objective:    Vitals: BP 133/83 (BP Location: Right Arm, Patient Position: Sitting, Cuff Size: Normal)   Pulse 83   Temp 98.5 F (36.9 C) (Oral)   Resp 15   Ht 5\' 10"  (1.778 m)   Wt 165 lb 3.2 oz (74.9 kg)   BMI 23.70 kg/m   Body mass index is 23.7 kg/m.  Advanced Directives 12/29/2018 12/23/2017 12/13/2016 10/22/2016 04/24/2015  Does Patient Have a Medical Advance Directive? Yes No No No No  Type of Advance Directive Living will;Healthcare Power of Attorney - - - -  Copy of [place] in Chart? No - copy requested - - - -  Would patient like information on creating a medical advance directive? - Yes (MAU/Ambulatory/Procedural Areas - Information given) - No - Patient declined No - patient declined information    Tobacco Social History   Tobacco Use  Smoking Status Former Smoker  . Quit date: 05/27/1978  . Years since quitting: 40.6  Smokeless Tobacco Never Used     Counseling given: Not Answered   Clinical Intake:  Pre-visit preparation completed: Yes  Pain : No/denies pain     Nutritional Status: BMI of 19-24  Normal Nutritional Risks: None Diabetes: Yes CBG done?: No Did pt. bring in CBG monitor from home?: No  How often do you need to have someone help you when you read instructions, pamphlets, or other written materials from your doctor or pharmacy?: 1 - Never  Nutrition Risk Assessment:  Has the patient had any N/V/D within the last 2 months?  No  Does the patient have any non-healing wounds?  No  Has the patient had any unintentional weight loss or weight gain?  No   Diabetes:  Is the patient diabetic?  Yes  If diabetic, was a CBG obtained today?  No  Did the patient bring  in their glucometer from home?  No  How often do you monitor your CBG's? Once a week and as needed.   Financial Strains and Diabetes Management:  Are you having any financial strains with the device, your supplies or your medication? No .  Does the patient want to be seen by Chronic Care Management for management of their diabetes?  No  Would the patient like to be referred to a Nutritionist or for Diabetic Management?  No     Diabetic Exams:  Diabetic Eye Exam: Completed 03/04/2018  Diabetic Foot Exam: Completed 12/23/2017. To be done by PCP today   Interpreter Needed?: No  Information entered by :: Braycen Burandt,LPN  Past Medical History:  Diagnosis Date  . Diabetes mellitus without complication ([place])   . Hyperlipidemia   . Hypertension   . Neuropathy   . Prostate cancer [facility])    Past Surgical History:  Procedure Laterality Date  . CATARACT EXTRACTION W/PHACO Left 04/24/2015   Procedure: CATARACT EXTRACTION PHACO AND INTRAOCULAR LENS PLACEMENT (IOC);  Surgeon: Estill Cotta, MD;  Location: [facility] ORS;  Service: Ophthalmology;  Laterality: Left;  [place] 01:12 AP% 25.7 CDE 28.30 fluid pack lot #[id number]  . COLONOSCOPY     Family History  Problem Relation Age of Onset  . Stroke Mother   . Cancer Mother        colon cancer  . COPD Father   .  COPD Brother   . Hearing loss Son    Social History   Socioeconomic History  . Marital status: Widowed    Spouse name: Not on file  . Number of children: Not on file  . Years of education: Not on file  . Highest education level: High school graduate  Occupational History  . Not on file  Social Needs  . Financial resource strain: Not hard at all  . Food insecurity    Worry: Never true    Inability: Never true  . Transportation needs    Medical: No    Non-medical: No  Tobacco Use  . Smoking status: Former Smoker    Quit date: 05/27/1978    Years since quitting: 40.6  . Smokeless tobacco: Never Used  Substance and Sexual  Activity  . Alcohol use: No    Alcohol/week: 0.0 standard drinks  . Drug use: No  . Sexual activity: Not on file  Lifestyle  . Physical activity    Days per week: 3 days    Minutes per session: 20 min  . Stress: Not at all  Relationships  . Social Herbalist on phone: Twice a week    Gets together: More than three times a week    Attends religious service: More than 4 times per year    Active member of club or organization: Yes    Attends meetings of clubs or organizations: More than 4 times per year    Relationship status: Widowed  Other Topics Concern  . Not on file  Social History Narrative   Lives in retirement community     Outpatient Encounter Medications as of 12/29/2018  Medication Sig  . DUREZOL 0.05 % EMUL Place 4.09 application into the left eye as needed.  . gabapentin (NEURONTIN) 600 MG tablet Take 2 tablets (1,200 mg total) by mouth 3 (three) times daily.  Marland Kitchen glucosamine-chondroitin 500-400 MG tablet Take 1 tablet by mouth 3 (three) times daily as needed.   Marland Kitchen lisinopril (PRINIVIL,ZESTRIL) 10 MG tablet 1 (one) Tablet, Oral, daily  . metFORMIN (GLUCOPHAGE-XR) 500 MG 24 hr tablet Take 2 tablets (1,000 mg total) by mouth daily with breakfast.  . pseudoephedrine-guaifenesin (MUCINEX D) 60-600 MG 12 hr tablet Take 1 tablet by mouth every 12 (twelve) hours.  . VOLTAREN 1 % GEL 4 GRAMS, Transdermal, four times daily, as needed  . ZETIA 10 MG tablet Take 1 tablet (10 mg total) by mouth at bedtime.  Ilean Skill Lipoic Acid 200 MG CAPS Take 200 mg by mouth 3 (three) times daily. (Patient not taking: Reported on 12/29/2018)  . [DISCONTINUED] MITIGARE 0.6 MG CAPS Start with 2 pills for dose 1.2, repeat 1 pill within 2 hour. Next day start 1 capsule daily (0.6mg ) until symptoms resolve up to 7-10 days (Patient not taking: Reported on 12/29/2018)   No facility-administered encounter medications on file as of 12/29/2018.     Activities of Daily Living In your present state of  health, do you have any difficulty performing the following activities: 12/29/2018  Hearing? N  Comment no hearing aids  Vision? N  Difficulty concentrating or making decisions? N  Walking or climbing stairs? N  Dressing or bathing? N  Doing errands, shopping? N  Preparing Food and eating ? N  Using the Toilet? N  In the past six months, have you accidently leaked urine? N  Do you have problems with loss of bowel control? N  Managing your Medications? N  Managing your  Finances? N  Housekeeping or managing your Housekeeping? Y  Comment retirement community does housekeeping  Some recent data might be hidden    Patient Care Team: Olin Hauser, DO as PCP - General (Family Medicine)   Assessment:   This is a routine wellness examination for [place].  Exercise Activities and Dietary recommendations Current Exercise Habits: Home exercise routine, Type of exercise: walking, Time (Minutes): 15, Frequency (Times/Week): 3, Weekly Exercise (Minutes/Week): 45, Intensity: Mild, Exercise limited by: None identified  Goals    . DIET - INCREASE WATER INTAKE     Recommend drinking at least 6-8 glasses of water a day        Fall Risk Fall Risk  12/29/2018 06/26/2018 05/28/2018 12/23/2017 12/23/2017  Falls in the past year? 0 0 0 No No  Number falls in past yr: - - - - -  Injury with Fall? - - - - -  Comment - - - - -  Risk Factor Category  - - - - -  Follow up - Falls evaluation completed Falls evaluation completed - -   FALL RISK PREVENTION PERTAINING TO THE HOME:  Any stairs in or around the home? yes If so, are there any without handrails? No   Home free of loose throw rugs in walkways, pet beds, electrical cords, etc? Yes  Adequate lighting in your home to reduce risk of falls? Yes   ASSISTIVE DEVICES UTILIZED TO PREVENT FALLS:  Life alert? No  Use of a cane, walker or w/c? No  Grab bars in the bathroom? Yes  lives in retirement community  Shower chair or bench in shower?  No  Elevated toilet seat or a handicapped toilet? No    TIMED UP AND GO:  Was the test performed? Yes .  Length of time to ambulate 10 feet: 11 sec.   GAIT:  Appearance of gait: Gait steady and fast without the use of an assistive device.  Education: Fall risk prevention has been discussed.  Intervention(s) required? No   DME/home health order needed?  No    Depression Screen PHQ 2/9 Scores 12/29/2018 06/26/2018 05/28/2018 12/23/2017  PHQ - 2 Score 0 0 0 0  PHQ- 9 Score - - - -    Cognitive Function     6CIT Screen 12/29/2018 12/23/2017 10/22/2016  What Year? 0 points 0 points 0 points  What month? 0 points 0 points 0 points  What time? 0 points 0 points 0 points  Count back from 20 0 points 0 points 0 points  Months in reverse 0 points 0 points 0 points  Repeat phrase 0 points 2 points 2 points  Total Score 0 2 2    Immunization History  Administered Date(s) Administered  . Influenza, High Dose Seasonal PF 03/20/2016  . Influenza-Unspecified 03/23/2015, 03/11/2017, 02/26/2018  . Pneumococcal Conjugate-13 03/20/2016  . Pneumococcal Polysaccharide-23 05/07/2013    Qualifies for Shingles Vaccine? Yes , Zostavax completed n/a. Due for Shingrix. Education has been provided regarding the importance of this vaccine. Pt has been advised to call insurance company to determine out of pocket expense. Advised may also receive vaccine at local pharmacy or Health Dept. Verbalized acceptance and understanding.  Tdap: up to date  Flu Vaccine: due 01/2019  Pneumococcal Vaccine: up to date  Screening Tests Health Maintenance  Topic Date Due  . FOOT EXAM  12/24/2018  . INFLUENZA VACCINE  12/26/2018  . OPHTHALMOLOGY EXAM  03/05/2019  . HEMOGLOBIN A1C  06/24/2019  . TETANUS/TDAP  06/24/2024  .  PNA vac Low Risk Adult  Completed   Cancer Screenings:  Colorectal Screening: no longer required  Lung Cancer Screening: (Low Dose CT Chest recommended if Age 36-80 years, 30 pack-year  currently smoking OR have quit w/in 15years.) does not qualify.    Additional Screening:  Hepatitis C Screening: does not qualify  Dental Screening: Recommended annual dental exams for proper oral hygiene  Community Resource Referral:  CRR required this visit?  No        Plan:  I have personally reviewed and addressed the Medicare Annual Wellness questionnaire and have noted the following in the patient's chart:  A. Medical and social history B. Use of alcohol, tobacco or illicit drugs  C. Current medications and supplements D. Functional ability and status E.  Nutritional status F.  Physical activity G. Advance directives H. List of other physicians I.  Hospitalizations, surgeries, and ER visits in previous 12 months J.  [place] such as hearing and vision if needed, cognitive and depression L. Referrals and appointments   In addition, I have reviewed and discussed with patient certain preventive protocols, quality metrics, and best practice recommendations. A written personalized care plan for preventive services as well as general preventive health recommendations were provided to patient.   Signed,   Bevelyn Ngo, LPN  06/01/1094 Nurse Health Advisor  Nurse Notes: none

## 2018-12-29 NOTE — Assessment & Plan Note (Signed)
Significantly improved A1c to 6.5, well controlled Complications - CKD-III, peripheral neuropathy, other including hyperlipidemia specifically hypertriglyceridemia - increases risk of future cardiovascular complications    Plan:  1. Continue current therapy - Metformin XR 500mg  may reduce dose in future. 2. Encourage improved lifestyle - low carb, low sugar diet, reduce portion size, continue improving regular exercise 3. Continue ACEi 4. Continue Gabapentin 1200 TID for now, still consider future Lyrica - advised caution with new med more risk side effect - would unlikely help his reduced sensation. - Start Alpha Lipoic Acid 200mg  TID 5. F/u with Dr Phoebe Sharps 02/2019 DM Foot exam done today 6. Follow-up 6 months for DM A1c

## 2018-12-29 NOTE — Assessment & Plan Note (Signed)
Well controlled HTN - except rare low readings, due to hydration it seems Complication: CKD-III  Plan: 1. REDUCE Lisinopril from 10 to 5mg  daily, new rx sent 2. Improve hydration, avoid NSAIDs may use Tylenol and topical NSAID, lifestyle regular exercise, low sodium 3. Monitor BP outside office if he can occasionally 4. Follow-up 6 months

## 2018-12-29 NOTE — Assessment & Plan Note (Signed)
See A&P for DM with neuropathy Complication of DM Controlled on Gabapentin - discussed possible lyrica would not address sensation but may reduce pain - he prefers to avoid new med now  Continue alpha lipoic acid if helpful

## 2018-12-29 NOTE — Patient Instructions (Addendum)
Thank you for coming to the office today.  Reduced Lisinopril from 10 down to 5mg , you can start new pill and put the old BP med aside for future if you need can discard if not needing it  Stay well hydrated  RESTART Zetia 10mg  nightly cholesterol medicine.  Continue other meds.  1. Chemistry - Mild reduced kidney function. Creatinine 1.41, similar to prior range 1.3 to 1.5. Normal electrolytes, normal liver function. Improved but mild elevated glucose.   2. Hemoglobin A1c (Diabetes screening) - 6.5, improved from prior 6.9 - 7.6   3. PSA Prostate Cancer Screening - < 0.1, negative - for history of prostate cancer   4. Cholesterol - Abnormal. Similar to last result, slightly elevated LDL 139, TG 279 similar to last 289, HDL 35. On Zetia   5. CBC Blood Counts - Normal, no anemia, other abnormality    Please schedule a Follow-up Appointment to: Return in about 6 months (around 07/01/2019) for 6 months for DM A1c, HTN.  If you have any other questions or concerns, please feel free to call the office or send a message through [place]. You may also schedule an earlier appointment if necessary.  Additionally, you may be receiving a survey about your experience at our office within a few days to 1 week by e-mail or mail. We value your feedback.  Nobie Putnam, DO [place]

## 2018-12-29 NOTE — Patient Instructions (Addendum)
[name] , Thank you for taking time to come for your Medicare Wellness Visit. I appreciate your ongoing commitment to your health goals. Please review the following plan we discussed and let me know if I can assist you in the future.   Screening recommendations/referrals: Colonoscopy: no longer required Recommended yearly ophthalmology/optometry visit for glaucoma screening and checkup Recommended yearly dental visit for hygiene and checkup  Vaccinations: Influenza vaccine: due 01/2019 Pneumococcal vaccine: up to date Tdap vaccine: up to date Shingles vaccine: shingrix eligible, check with your insurance company for coverage     Advanced directives: Please bring a copy of your health care power of attorney and living will to the office at your convenience.  Conditions/risks identified: diabetic, please schedule your diabetic eye exam.  Make sure to stay hydrated, drink at least 6-8 glasses of water a day.   Next appointment: follow up in one year for your annual wellness visit.   Preventive Care 83 Years and Older, Male Preventive care refers to lifestyle choices and visits with your health care provider that can promote health and wellness. What does preventive care include?  A yearly physical exam. This is also called an annual well check.  Dental exams once or twice a year.  Routine eye exams. Ask your health care provider how often you should have your eyes checked.  Personal lifestyle choices, including:  Daily care of your teeth and gums.  Regular physical activity.  Eating a healthy diet.  Avoiding tobacco and drug use.  Limiting alcohol use.  Practicing safe sex.  Taking low doses of aspirin every day.  Taking vitamin and mineral supplements as recommended by your health care provider. What happens during an annual well check? The services and screenings done by your health care provider during your annual well check will depend on your age, overall  health, lifestyle risk factors, and family history of disease. Counseling  Your health care provider may ask you questions about your:  Alcohol use.  Tobacco use.  Drug use.  Emotional well-being.  Home and relationship well-being.  Sexual activity.  Eating habits.  History of falls.  Memory and ability to understand (cognition).  Work and work Statistician. Screening  You may have the following tests or measurements:  Height, weight, and BMI.  Blood pressure.  Lipid and cholesterol levels. These may be checked every 5 years, or more frequently if you are over 28 years old.  Skin check.  Lung cancer screening. You may have this screening every year starting at age 31 if you have a 30-pack-year history of smoking and currently smoke or have quit within the past 15 years.  Fecal occult blood test (FOBT) of the stool. You may have this test every year starting at age 49.  Flexible sigmoidoscopy or colonoscopy. You may have a sigmoidoscopy every 5 years or a colonoscopy every 10 years starting at age 83.  Prostate cancer screening. Recommendations will vary depending on your family history and other risks.  Hepatitis C blood test.  Hepatitis B blood test.  Sexually transmitted disease (STD) testing.  Diabetes screening. This is done by checking your blood sugar (glucose) after you have not eaten for a while (fasting). You may have this done every 1-3 years.  Abdominal aortic aneurysm (AAA) screening. You may need this if you are a current or former smoker.  Osteoporosis. You may be screened starting at age 48 if you are at high risk. Talk with your health care provider about your test  results, treatment options, and if necessary, the need for more tests. Vaccines  Your health care provider may recommend certain vaccines, such as:  Influenza vaccine. This is recommended every year.  Tetanus, diphtheria, and acellular pertussis (Tdap, Td) vaccine. You may need a Td  booster every 10 years.  Zoster vaccine. You may need this after age 72.  Pneumococcal 13-valent conjugate (PCV13) vaccine. One dose is recommended after age 61.  Pneumococcal polysaccharide (PPSV23) vaccine. One dose is recommended after age 22. Talk to your health care provider about which screenings and vaccines you need and how often you need them. This information is not intended to replace advice given to you by your health care provider. Make sure you discuss any questions you have with your health care provider. Document Released: 06/09/2015 Document Revised: 01/31/2016 Document Reviewed: 03/14/2015 Elsevier Interactive Patient Education  2017 [place] Prevention in the Home Falls can cause injuries. They can happen to people of all ages. There are many things you can do to make your home safe and to help prevent falls. What can I do on the outside of my home?  Regularly fix the edges of walkways and driveways and fix any cracks.  Remove anything that might make you trip as you walk through a door, such as a raised step or threshold.  Trim any bushes or trees on the path to your home.  Use bright outdoor lighting.  Clear any walking paths of anything that might make someone trip, such as rocks or tools.  Regularly check to see if handrails are loose or broken. Make sure that both sides of any steps have handrails.  Any raised decks and porches should have guardrails on the edges.  Have any leaves, snow, or ice cleared regularly.  Use sand or salt on walking paths during winter.  Clean up any spills in your garage right away. This includes oil or grease spills. What can I do in the bathroom?  Use night lights.  Install grab bars by the toilet and in the tub and shower. Do not use towel bars as grab bars.  Use non-skid mats or decals in the tub or shower.  If you need to sit down in the shower, use a plastic, non-slip stool.  Keep the floor dry. Clean up  any water that spills on the floor as soon as it happens.  Remove soap buildup in the tub or shower regularly.  Attach bath mats securely with double-sided non-slip rug tape.  Do not have throw rugs and other things on the floor that can make you trip. What can I do in the bedroom?  Use night lights.  Make sure that you have a light by your bed that is easy to reach.  Do not use any sheets or blankets that are too big for your bed. They should not hang down onto the floor.  Have a firm chair that has side arms. You can use this for support while you get dressed.  Do not have throw rugs and other things on the floor that can make you trip. What can I do in the kitchen?  Clean up any spills right away.  Avoid walking on wet floors.  Keep items that you use a lot in easy-to-reach places.  If you need to reach something above you, use a strong step stool that has a grab bar.  Keep electrical cords out of the way.  Do not use floor polish or wax that makes  floors slippery. If you must use wax, use non-skid floor wax.  Do not have throw rugs and other things on the floor that can make you trip. What can I do with my stairs?  Do not leave any items on the stairs.  Make sure that there are handrails on both sides of the stairs and use them. Fix handrails that are broken or loose. Make sure that handrails are as long as the stairways.  Check any carpeting to make sure that it is firmly attached to the stairs. Fix any carpet that is loose or worn.  Avoid having throw rugs at the top or bottom of the stairs. If you do have throw rugs, attach them to the floor with carpet tape.  Make sure that you have a light switch at the top of the stairs and the bottom of the stairs. If you do not have them, ask someone to add them for you. What else can I do to help prevent falls?  Wear shoes that:  Do not have high heels.  Have rubber bottoms.  Are comfortable and fit you well.  Are  closed at the toe. Do not wear sandals.  If you use a stepladder:  Make sure that it is fully opened. Do not climb a closed stepladder.  Make sure that both sides of the stepladder are locked into place.  Ask someone to hold it for you, if possible.  Clearly mark and make sure that you can see:  Any grab bars or handrails.  First and last steps.  Where the edge of each step is.  Use tools that help you move around (mobility aids) if they are needed. These include:  Canes.  Walkers.  Scooters.  Crutches.  Turn on the lights when you go into a dark area. Replace any light bulbs as soon as they burn out.  Set up your furniture so you have a clear path. Avoid moving your furniture around.  If any of your floors are uneven, fix them.  If there are any pets around you, be aware of where they are.  Review your medicines with your doctor. Some medicines can make you feel dizzy. This can increase your chance of falling. Ask your doctor what other things that you can do to help prevent falls. This information is not intended to replace advice given to you by your health care provider. Make sure you discuss any questions you have with your health care provider. Document Released: 03/09/2009 Document Revised: 10/19/2015 Document Reviewed: 06/17/2014 Elsevier Interactive Patient Education  2017 [employer].

## 2019-01-06 DIAGNOSIS — D485 Neoplasm of uncertain behavior of skin: Secondary | ICD-10-CM | POA: Diagnosis not present

## 2019-01-06 DIAGNOSIS — L820 Inflamed seborrheic keratosis: Secondary | ICD-10-CM | POA: Diagnosis not present

## 2019-01-06 DIAGNOSIS — D170 Benign lipomatous neoplasm of skin and subcutaneous tissue of head, face and neck: Secondary | ICD-10-CM | POA: Diagnosis not present

## 2019-01-06 DIAGNOSIS — C44319 Basal cell carcinoma of skin of other parts of face: Secondary | ICD-10-CM | POA: Diagnosis not present

## 2019-02-09 ENCOUNTER — Other Ambulatory Visit: Payer: Self-pay | Admitting: Family Medicine

## 2019-02-09 DIAGNOSIS — E1142 Type 2 diabetes mellitus with diabetic polyneuropathy: Secondary | ICD-10-CM

## 2019-03-01 ENCOUNTER — Telehealth: Payer: Self-pay | Admitting: Family Medicine

## 2019-03-01 NOTE — Chronic Care Management (AMB) (Signed)
°  Chronic Care Management   Outreach Note  03/01/2019 Name: [name] MRN: [id number] DOB: 08-Sep-1933  Referred by: Olin Hauser, DO Reason for referral : Chronic Care Management (Initial CCM outreach was unsuccessful.)   An unsuccessful telephone outreach was attempted today. The patient was referred to the case management team by for assistance with care management and care coordination.   Follow Up Plan: A HIPPA compliant phone message was left for the patient providing contact information and requesting a return call.  The care management team will reach out to the patient again over the next 7 days.  If patient returns call to provider office, please advise to call [place] [facility] [place]  [email or web address]   ??[phone]

## 2019-03-03 NOTE — Chronic Care Management (AMB) (Signed)
°  Chronic Care Management   Outreach Note  03/03/2019 Name: [name] MRN: [id number] DOB: 09-21-1933  Referred by: Olin Hauser, DO Reason for referral : Chronic Care Management (Initial CCM outreach was unsuccessful.) and Chronic Care Management (Second CCM outreach was unsuccessful)   A second unsuccessful telephone outreach was attempted today. The patient was referred to the case management team for assistance with care management and care coordination.   Follow Up Plan: A HIPPA compliant phone message was left for the patient providing contact information and requesting a return call.  The care management team will reach out to the patient again over the next 7 days.  If patient returns call to provider office, please advise to call [place] [facility] [place]  [email or web address]   ??[phone]

## 2019-03-08 NOTE — Chronic Care Management (AMB) (Signed)
Chronic Care Management   Note  03/08/2019 Name: [name] MRN: [id number] DOB: 12-19-1933  [name] is a 85 y.o. year old male who is a primary care patient of Olin Hauser, DO. I reached out to [place] by phone today in response to a referral sent by [name] health plan.     [name] was given information about Chronic Care Management services today including:  1. CCM service includes personalized support from designated clinical staff supervised by his physician, including individualized plan of care and coordination with other care providers 2. 24/7 contact phone numbers for assistance for urgent and routine care needs. 3. Service will only be billed when office clinical staff spend 20 minutes or more in a month to coordinate care. 4. Only one practitioner may furnish and bill the service in a calendar month. 5. The patient may stop CCM services at any time (effective at the end of the month) by phone call to the office staff. 6. The patient will be responsible for cost sharing (co-pay) of up to 20% of the service fee (after annual deductible is met).  Patient did not agree to enrollment in care management services and does not wish to consider at this time.  Follow up plan: The patient has been provided with contact information for the chronic care management team and has been advised to call with any health related questions or concerns.   [place]  [email or web address]   ??[phone]

## 2019-03-11 DIAGNOSIS — L814 Other melanin hyperpigmentation: Secondary | ICD-10-CM | POA: Diagnosis not present

## 2019-03-11 DIAGNOSIS — L578 Other skin changes due to chronic exposure to nonionizing radiation: Secondary | ICD-10-CM | POA: Diagnosis not present

## 2019-03-11 DIAGNOSIS — C44319 Basal cell carcinoma of skin of other parts of face: Secondary | ICD-10-CM | POA: Diagnosis not present

## 2019-05-24 DIAGNOSIS — Z20828 Contact with and (suspected) exposure to other viral communicable diseases: Secondary | ICD-10-CM | POA: Diagnosis not present

## 2019-05-25 DIAGNOSIS — H2511 Age-related nuclear cataract, right eye: Secondary | ICD-10-CM | POA: Diagnosis not present

## 2019-05-25 DIAGNOSIS — H35373 Puckering of macula, bilateral: Secondary | ICD-10-CM | POA: Diagnosis not present

## 2019-05-25 DIAGNOSIS — E119 Type 2 diabetes mellitus without complications: Secondary | ICD-10-CM | POA: Diagnosis not present

## 2019-05-25 LAB — HM DIABETES EYE EXAM

## 2019-06-14 ENCOUNTER — Other Ambulatory Visit: Payer: Self-pay | Admitting: Family Medicine

## 2019-06-14 DIAGNOSIS — I129 Hypertensive chronic kidney disease with stage 1 through stage 4 chronic kidney disease, or unspecified chronic kidney disease: Secondary | ICD-10-CM

## 2019-06-14 DIAGNOSIS — E1142 Type 2 diabetes mellitus with diabetic polyneuropathy: Secondary | ICD-10-CM

## 2019-06-14 MED ORDER — LISINOPRIL 5 MG PO TABS: 5.0000 mg | ORAL_TABLET | Freq: Every day | ORAL | 1 refills | Status: DC

## 2019-08-09 ENCOUNTER — Ambulatory Visit (INDEPENDENT_AMBULATORY_CARE_PROVIDER_SITE_OTHER): Payer: PPO | Admitting: Family Medicine

## 2019-08-09 ENCOUNTER — Other Ambulatory Visit: Payer: Self-pay

## 2019-08-09 ENCOUNTER — Encounter: Payer: Self-pay | Admitting: Family Medicine

## 2019-08-09 DIAGNOSIS — M109 Gout, unspecified: Secondary | ICD-10-CM | POA: Diagnosis not present

## 2019-08-09 MED ORDER — PREDNISONE 20 MG PO TABS: ORAL_TABLET | ORAL | 0 refills | Status: DC

## 2019-08-09 NOTE — Progress Notes (Signed)
Subjective:    Patient ID: [name], male    DOB: [birth year], 86 y.o.   MRN: [id number]  [name] is a 86 y.o. male presenting on 08/09/2019 for Gout (left toe swell redness onset yesterday)  Virtual / Telehealth Encounter - Telephone  The purpose of this virtual visit is to provide medical care while limiting exposure to the novel coronavirus (COVID19) for both patient and office staff.  Consent was obtained for remote visit:  Yes.   Answered questions that patient had about telehealth interaction:  Yes.   I discussed the limitations, risks, security and privacy concerns of performing an evaluation and management service by video/telephone. I also discussed with the patient that there may be a patient responsible charge related to this service. The patient expressed understanding and agreed to proceed.  Patient Location: Home Provider Location: [facility])   Patient presents for a same day appointment.  HPI   Left Toe / Foot Pain Swelling / Suspected Gout Reports symptoms started yesterday with acute L great toe and foot swelling and pain. Worse with sensitivity and pain even worse with bed sheet, also he has been less active and keeping toe elevated. - Taking Ibuprofen low dose PRN with some relief. Not diagnosed with gout before, has had similar flare up. No uric acid lab test. - PMH Diabetes, CKD-III Denies any fever chills other toe or joint involvement redness, injury, fall, numbness tingling weakness  Health Maintenance: COVID19 vaccines UTD  Depression screen [facility] 2/9 08/09/2019 12/29/2018 06/26/2018  Decreased Interest 0 0 0  Down, Depressed, Hopeless 0 0 0  PHQ - 2 Score 0 0 0  Altered sleeping - - -  Tired, decreased energy - - -  Change in appetite - - -  Feeling bad or failure about yourself  - - -  Trouble concentrating - - -  Moving slowly or fidgety/restless - - -  Suicidal thoughts - - -  PHQ-9 Score - - -    Social  History   Tobacco Use  . Smoking status: Former Smoker    Quit date: 05/27/1978    Years since quitting: 41.2  . Smokeless tobacco: Never Used  Substance Use Topics  . Alcohol use: No    Alcohol/week: 0.0 standard drinks  . Drug use: No    Review of Systems Per HPI unless specifically indicated above     Objective:    There were no vitals taken for this visit.  Wt Readings from Last 3 Encounters:  12/29/18 165 lb 3.2 oz (74.9 kg)  12/29/18 165 lb 3.2 oz (74.9 kg)  06/26/18 164 lb (74.4 kg)    Physical Exam   Telephone visit virtually. No exam done in person.  Results for orders placed or performed in visit on 12/22/18  PSA  Result Value Ref Range   PSA <0.1 < OR = 4.0 ng/mL  Lipid panel  Result Value Ref Range   Cholesterol 217 (H) <200 mg/dL   HDL 35 (L) > OR = 40 mg/dL   Triglycerides 279 (H) <150 mg/dL   LDL Cholesterol (Calc) 139 (H) mg/dL (calc)   Total CHOL/HDL Ratio 6.2 (H) <5.0 (calc)   Non-HDL Cholesterol (Calc) 182 (H) <130 mg/dL (calc)  COMPLETE METABOLIC PANEL WITH GFR  Result Value Ref Range   Glucose, Bld 112 (H) 65 - 99 mg/dL   BUN 25 7 - 25 mg/dL   Creat 1.41 (H) 0.70 - 1.11 mg/dL   GFR,  Est Non African American 46 (L) > OR = 60 mL/min/1.61m2   GFR, Est African American 53 (L) > OR = 60 mL/min/1.57m2   BUN/Creatinine Ratio 18 6 - 22 (calc)   Sodium 137 135 - 146 mmol/L   Potassium 4.4 3.5 - 5.3 mmol/L   Chloride 99 98 - 110 mmol/L   CO2 29 20 - 32 mmol/L   Calcium 10.0 8.6 - 10.3 mg/dL   Total Protein 7.4 6.1 - 8.1 g/dL   Albumin 4.6 3.6 - 5.1 g/dL   Globulin 2.8 1.9 - 3.7 g/dL (calc)   AG Ratio 1.6 1.0 - 2.5 (calc)   Total Bilirubin 0.5 0.2 - 1.2 mg/dL   Alkaline phosphatase (APISO) 44 35 - 144 U/L   AST 22 10 - 35 U/L   ALT 14 9 - 46 U/L  CBC with Differential/Platelet  Result Value Ref Range   WBC 7.1 3.8 - 10.8 Thousand/uL   RBC 5.58 4.20 - 5.80 Million/uL   Hemoglobin 17.1 13.2 - 17.1 g/dL   HCT 48.5 38.5 - 50.0 %   MCV 86.9 80.0  - 100.0 fL   MCH 30.6 27.0 - 33.0 pg   MCHC 35.3 32.0 - 36.0 g/dL   RDW 13.5 11.0 - 15.0 %   Platelets 148 140 - 400 Thousand/uL   MPV 10.4 7.5 - 12.5 fL   Neutro Abs 3,323 1,500 - 7,800 cells/uL   Lymphs Abs 2,925 850 - 3,900 cells/uL   Absolute Monocytes 618 200 - 950 cells/uL   Eosinophils Absolute 163 15 - 500 cells/uL   Basophils Absolute 71 0 - 200 cells/uL   Neutrophils Relative % 46.8 %   Total Lymphocyte 41.2 %   Monocytes Relative 8.7 %   Eosinophils Relative 2.3 %   Basophils Relative 1.0 %  Hemoglobin A1c  Result Value Ref Range   Hgb A1c MFr Bld 6.5 (H) <5.7 % of total Hgb   Mean Plasma Glucose 140 (calc)   eAG (mmol/L) 7.7 (calc)      Assessment & Plan:   Problem List Items Addressed This Visit    None    Visit Diagnoses    Acute gout involving toe of left foot, unspecified cause    -  Primary   Relevant Medications   predniSONE (DELTASONE) 20 MG tablet      Clinically consistent with acute gout flare of Left great toe MTP and foot,  (1 day ago) with some mild improvement on oral NSAID and rest. No prior known Gout Not on uric acid therapy. No uric acid lab result  Plan: 1 Start Prednisone taper over 8 days 2. Avoid excessive ambulation, relative rest, ice if helps, can take Tylenol PRN 3. Avoid food triggers (red meat, alcohol) Follow-up  within 4 weeks once acute flare resolved, discuss uric acid lower therapy and prevention    Meds ordered this encounter  Medications  . predniSONE (DELTASONE) 20 MG tablet    Sig: Take 2 tablets (40mg ) for 3 days, then 1 tab (20mg ) for 3 days, then half tab (10mg ) for 2 days    Dispense:  10 tablet    Refill:  0      Follow up plan: Return in about 4 weeks (around 09/06/2019) for 4 weeks DM A1c, gout.   Patient verbalizes understanding with the above medical recommendations including the limitation of remote medical advice.  Specific follow-up and call-back criteria were given for patient to follow-up or  seek medical care more urgently if needed.  Total duration of direct patient care provided via telephone: 9 minutes    Nobie Putnam, [facility] 08/09/2019, 4:22 PM

## 2019-09-07 DIAGNOSIS — D492 Neoplasm of unspecified behavior of bone, soft tissue, and skin: Secondary | ICD-10-CM | POA: Diagnosis not present

## 2019-09-07 DIAGNOSIS — L858 Other specified epidermal thickening: Secondary | ICD-10-CM | POA: Diagnosis not present

## 2019-09-07 DIAGNOSIS — L821 Other seborrheic keratosis: Secondary | ICD-10-CM | POA: Diagnosis not present

## 2019-09-07 DIAGNOSIS — L570 Actinic keratosis: Secondary | ICD-10-CM | POA: Diagnosis not present

## 2019-10-06 ENCOUNTER — Ambulatory Visit: Payer: PPO | Admitting: Family Medicine

## 2019-10-08 ENCOUNTER — Other Ambulatory Visit: Payer: Self-pay | Admitting: Family Medicine

## 2019-10-08 ENCOUNTER — Other Ambulatory Visit: Payer: Self-pay

## 2019-10-08 ENCOUNTER — Encounter: Payer: Self-pay | Admitting: Family Medicine

## 2019-10-08 ENCOUNTER — Ambulatory Visit (INDEPENDENT_AMBULATORY_CARE_PROVIDER_SITE_OTHER): Payer: PPO | Admitting: Family Medicine

## 2019-10-08 VITALS — BP 114/64 | HR 64 | Temp 97.3°F | Resp 16 | Ht 70.0 in | Wt 170.0 lb

## 2019-10-08 DIAGNOSIS — E1142 Type 2 diabetes mellitus with diabetic polyneuropathy: Secondary | ICD-10-CM

## 2019-10-08 DIAGNOSIS — R351 Nocturia: Secondary | ICD-10-CM

## 2019-10-08 DIAGNOSIS — G63 Polyneuropathy in diseases classified elsewhere: Secondary | ICD-10-CM

## 2019-10-08 DIAGNOSIS — E1169 Type 2 diabetes mellitus with other specified complication: Secondary | ICD-10-CM

## 2019-10-08 DIAGNOSIS — M1A0720 Idiopathic chronic gout, left ankle and foot, without tophus (tophi): Secondary | ICD-10-CM

## 2019-10-08 DIAGNOSIS — M150 Primary generalized (osteo)arthritis: Secondary | ICD-10-CM

## 2019-10-08 DIAGNOSIS — M109 Gout, unspecified: Secondary | ICD-10-CM | POA: Diagnosis not present

## 2019-10-08 DIAGNOSIS — M159 Polyosteoarthritis, unspecified: Secondary | ICD-10-CM

## 2019-10-08 DIAGNOSIS — I129 Hypertensive chronic kidney disease with stage 1 through stage 4 chronic kidney disease, or unspecified chronic kidney disease: Secondary | ICD-10-CM

## 2019-10-08 DIAGNOSIS — E785 Hyperlipidemia, unspecified: Secondary | ICD-10-CM

## 2019-10-08 DIAGNOSIS — Z8546 Personal history of malignant neoplasm of prostate: Secondary | ICD-10-CM

## 2019-10-08 MED ORDER — PREDNISONE 20 MG PO TABS: ORAL_TABLET | ORAL | 0 refills | Status: DC

## 2019-10-08 MED ORDER — PREGABALIN 75 MG PO CAPS: 75.0000 mg | ORAL_CAPSULE | Freq: Two times a day (BID) | ORAL | 2 refills | Status: DC

## 2019-10-08 NOTE — Progress Notes (Signed)
Subjective:    Patient ID: [name], male    DOB: 08/26/1933, 86 y.o.   MRN: [id number]  [name] is a 86 y.o. male presenting on 10/08/2019 for Gout (left foot pain onset yesterday)  Patient presents for a same day appointment.   HPI   Left Toe / Foot Pain Swelling / Suspected Gout, recurrent flare Similar to last flare 07/2019, presumed gout resolved w prednisone burst, did no return for uric acid test. - now today, acute symptoms started yesterday with acute L great toe and foot swelling and pain. Worse with sensitivity and pain even with light touch. Difficulty with walking on it at times. - Started Cherry juice. Fam member with gout. - PMH Diabetes, CKD-III Denies any fever chills other toe or joint involvement redness, injury, fall, numbness tingling weakness  CHRONIC DM, Type 2 with DM Neuropathy: Last lab A1c down from 6.9 down to 6.5 - due for repeat He is doing well CBGs:Checks fasting AM CBG 1-3 x weekly -avg 100-120. No low of < 100 Meds: Metformin XR 500mg  daily Reports good compliance. Tolerating well w/o side-effects Currently on ACEi- he is asking about dose Lifestyle: - Diet (Improved diet, more fruit vegetables, low carb, limiting sweets) - Exercise (still regular exercise regimen, walking and home exercise regimen) Dr Phoebe Sharps Exam in past - For neuropathy, was still taking Gabapentin 1200mg  TID- seems to[place]phis symptoms, without side effects, still has some refractory symptomsagain only with stinging and burning but has reduced sensation affecting his balance - today he asks about Lyrica Denies hypoglycemia, polyuria, visual changes     Depression screen [facility] 2/9 10/08/2019 08/09/2019 12/29/2018  Decreased Interest 0 0 0  Down, Depressed, Hopeless 0 0 0  PHQ - 2 Score 0 0 0  Altered sleeping - - -  Tired, decreased energy - - -  Change in appetite - - -  Feeling bad or failure about yourself  - - -  Trouble concentrating - - -    Moving slowly or fidgety/restless - - -  Suicidal thoughts - - -  PHQ-9 Score - - -    Social History   Tobacco Use  . Smoking status: Former Smoker    Quit date: 05/27/1978    Years since quitting: 41.3  . Smokeless tobacco: Never Used  Substance Use Topics  . Alcohol use: No    Alcohol/week: 0.0 standard drinks  . Drug use: No    Review of Systems Per HPI unless specifically indicated above     Objective:    BP 114/64   Pulse 64   Temp (!) 97.3 F (36.3 C) (Temporal)   Resp 16   Ht 5\' 10"  (1.778 m)   Wt 170 lb (77.1 kg)   BMI 24.39 kg/m   Wt Readings from Last 3 Encounters:  10/08/19 170 lb (77.1 kg)  12/29/18 165 lb 3.2 oz (74.9 kg)  12/29/18 165 lb 3.2 oz (74.9 kg)    Physical Exam Vitals and nursing note reviewed.  Constitutional:      General: He is not in acute distress.    Appearance: He is well-developed. He is not diaphoretic.     Comments: Well-appearing, comfortable, cooperative  HENT:     Head: Normocephalic and atraumatic.  Eyes:     General:        Right eye: No discharge.        Left eye: No discharge.     Conjunctiva/sclera: Conjunctivae normal.  Cardiovascular:  Rate and Rhythm: Normal rate.  Pulmonary:     Effort: Pulmonary effort is normal.  Musculoskeletal:     Comments: Left great toe forefoot area with mild erythema and edema and warmth compared to Right. Mild tender.  Skin:    General: Skin is warm and dry.     Findings: No erythema or rash.  Neurological:     Mental Status: He is alert and oriented to person, place, and time.  Psychiatric:        Behavior: Behavior normal.     Comments: Well groomed, good eye contact, normal speech and thoughts      Diabetic Foot Exam - Simple   Simple Foot Form Diabetic Foot exam was performed with the following findings: Yes 10/08/2019  2:38 PM  Visual Inspection See comments: Yes Sensation Testing See comments: Yes Pulse Check Posterior Tibialis and Dorsalis pulse intact  bilaterally: Yes Comments Left foot with medial aspect and great toe with inc edema erythema with gout flare. Right great toe with callus formation. Several areas of pre-callus formation dry skin.     Results for orders placed or performed in visit on 12/22/18  PSA  Result Value Ref Range   PSA <0.1 < OR = 4.0 ng/mL  Lipid panel  Result Value Ref Range   Cholesterol 217 (H) <200 mg/dL   HDL 35 (L) > OR = 40 mg/dL   Triglycerides 279 (H) <150 mg/dL   LDL Cholesterol (Calc) 139 (H) mg/dL (calc)   Total CHOL/HDL Ratio 6.2 (H) <5.0 (calc)   Non-HDL Cholesterol (Calc) 182 (H) <130 mg/dL (calc)  COMPLETE METABOLIC PANEL WITH GFR  Result Value Ref Range   Glucose, Bld 112 (H) 65 - 99 mg/dL   BUN 25 7 - 25 mg/dL   Creat 1.41 (H) 0.70 - 1.11 mg/dL   GFR, Est Non African American 46 (L) > OR = 60 mL/min/1.23m2   GFR, Est African American 53 (L) > OR = 60 mL/min/1.46m2   BUN/Creatinine Ratio 18 6 - 22 (calc)   Sodium 137 135 - 146 mmol/L   Potassium 4.4 3.5 - 5.3 mmol/L   Chloride 99 98 - 110 mmol/L   CO2 29 20 - 32 mmol/L   Calcium 10.0 8.6 - 10.3 mg/dL   Total Protein 7.4 6.1 - 8.1 g/dL   Albumin 4.6 3.6 - 5.1 g/dL   Globulin 2.8 1.9 - 3.7 g/dL (calc)   AG Ratio 1.6 1.0 - 2.5 (calc)   Total Bilirubin 0.5 0.2 - 1.2 mg/dL   Alkaline phosphatase (APISO) 44 35 - 144 U/L   AST 22 10 - 35 U/L   ALT 14 9 - 46 U/L  CBC with Differential/Platelet  Result Value Ref Range   WBC 7.1 3.8 - 10.8 Thousand/uL   RBC 5.58 4.20 - 5.80 Million/uL   Hemoglobin 17.1 13.2 - 17.1 g/dL   HCT 48.5 38.5 - 50.0 %   MCV 86.9 80.0 - 100.0 fL   MCH 30.6 27.0 - 33.0 pg   MCHC 35.3 32.0 - 36.0 g/dL   RDW 13.5 11.0 - 15.0 %   Platelets 148 140 - 400 Thousand/uL   MPV 10.4 7.5 - 12.5 fL   Neutro Abs 3,323 1,500 - 7,800 cells/uL   Lymphs Abs 2,925 850 - 3,900 cells/uL   Absolute Monocytes 618 200 - 950 cells/uL   Eosinophils Absolute 163 15 - 500 cells/uL   Basophils Absolute 71 0 - 200 cells/uL    Neutrophils Relative % 46.8 %  Total Lymphocyte 41.2 %   Monocytes Relative 8.7 %   Eosinophils Relative 2.3 %   Basophils Relative 1.0 %  Hemoglobin A1c  Result Value Ref Range   Hgb A1c MFr Bld 6.5 (H) <5.7 % of total Hgb   Mean Plasma Glucose 140 (calc)   eAG (mmol/L) 7.7 (calc)      Assessment & Plan:   Problem List Items Addressed This Visit    Type 2 diabetes mellitus with diabetic polyneuropathy ([place])    Last result in 2020, significantly improved A1c to 6.5, well controlled Complications - CKD-III, peripheral neuropathy, other including hyperlipidemia specifically hypertriglyceridemia - increases risk of future cardiovascular complications    Plan:  1. Continue current therapy - Metformin XR 500mg  may reduce dose in future. 2. Encourage improved lifestyle - low carb, low sugar diet, reduce portion size, continue improving regular exercise 3. Continue ACEi 4. DC Gabapentin Switch to Lyrica 75 BID - see A&P - May take alpha lipoic acid if helpful - unsure if taking. 5. F/u with Dr Phoebe Sharps DM Foot exam done today 6. Follow-up 6 months for annual      Relevant Medications   pregabalin (LYRICA) 75 MG capsule   Polyneuropathy associated with underlying disease ([place]) - Primary    Chronic peripheral polyneuropathy Likely secondary to DM Failed gabapentin, alpha lipoic acid  Goal to trial next med option with Lyrica as discussed DC Gabapentin Switch to Lyrica 75mg  caps take one TWICE a day for now, in future max dose is 225 up to 300mg , we can adjust if indicated. Checked PDMP.      Relevant Medications   pregabalin (LYRICA) 75 MG capsule    Other Visit Diagnoses    Acute gout involving toe of left foot, unspecified cause       Relevant Medications   predniSONE (DELTASONE) 20 MG tablet      Clinically consistent with acute gout flare of Left foot podagra area with L great toe MTP and foot,  (1 day ago) with some mild improvement on oral NSAID and rest. Prior  gout flare very similar in 07/2019. Not on uric acid therapy. No uric acid lab result  Plan: 1 Start Prednisone taper over 8 days - repeat course, previously resolved in 07/2019, caution with NSAID due to CKD 2. Avoid excessive ambulation, relative rest, ice if helps, can take Tylenol PRN 3. Avoid food triggers (red meat, alcohol) Follow-up  within 4 weeks once acute flare resolved, discuss uric acid lower therapy and prevention   Meds ordered this encounter  Medications  . predniSONE (DELTASONE) 20 MG tablet    Sig: Take 2 tablets (40mg ) for 3 days, then 1 tab (20mg ) for 3 days, then half tab (10mg ) for 2 days    Dispense:  10 tablet    Refill:  0  . pregabalin (LYRICA) 75 MG capsule    Sig: Take 1 capsule (75 mg total) by mouth 2 (two) times daily.    Dispense:  60 capsule    Refill:  2     Follow up plan: Return in about 3 months (around 01/08/2020) for Annual Physical.  Future labs ordered for 12/31/19 add Uric Acid   Nobie Putnam, DO [facility] 10/08/2019, 2:37 PM

## 2019-10-08 NOTE — Addendum Note (Signed)
Addended by: Olin Hauser on: 10/08/2019 04:56 PM   Modules accepted: Orders

## 2019-10-08 NOTE — Patient Instructions (Addendum)
Thank you for coming to the office today.  Stop Gabapentin Start Lyrica (generic pregabalin) 1 capsule 75mg  TWICE a day. We can adjust dose in future if need.  For Gout - use Prednisone taper.  Rest ice elevation.  We can check blood test for gout next time.  DUE for FASTING BLOOD WORK (no food or drink after midnight before the lab appointment, only water or coffee without cream/sugar on the morning of)  SCHEDULE "Lab Only" visit in the morning at the clinic for lab draw in 3 MONTHS   - Make sure Lab Only appointment is at about 1 week before your next appointment, so that results will be available  For Lab Results, once available within 2-3 days of blood draw, you can can log in to MyChart online to view your results and a brief explanation. Also, we can discuss results at next follow-up visit.   Please schedule a Follow-up Appointment to: Return in about 3 months (around 01/08/2020) for Annual Physical.  If you have any other questions or concerns, please feel free to call the office or send a message through [place]. You may also schedule an earlier appointment if necessary.  Additionally, you may be receiving a survey about your experience at our office within a few days to 1 week by e-mail or mail. We value your feedback.  Nobie Putnam, DO [place]

## 2019-10-08 NOTE — Assessment & Plan Note (Addendum)
Last result in 2020, significantly improved A1c to 6.5, well controlled Complications - CKD-III, peripheral neuropathy, other including hyperlipidemia specifically hypertriglyceridemia - increases risk of future cardiovascular complications    Plan:  1. Continue current therapy - Metformin XR 500mg  may reduce dose in future. 2. Encourage improved lifestyle - low carb, low sugar diet, reduce portion size, continue improving regular exercise 3. Continue ACEi 4. DC Gabapentin Switch to Lyrica 75 BID - see A&P - May take alpha lipoic acid if helpful - unsure if taking. 5. F/u with Dr Phoebe Sharps DM Foot exam done today 6. Follow-up 6 months for annual

## 2019-10-08 NOTE — Assessment & Plan Note (Signed)
Chronic peripheral polyneuropathy Likely secondary to DM Failed gabapentin, alpha lipoic acid  Goal to trial next med option with Lyrica as discussed DC Gabapentin Switch to Lyrica 75mg  caps take one TWICE a day for now, in future max dose is 225 up to 300mg , we can adjust if indicated. Checked PDMP.

## 2019-11-03 ENCOUNTER — Telehealth: Payer: Self-pay

## 2019-11-03 DIAGNOSIS — E1142 Type 2 diabetes mellitus with diabetic polyneuropathy: Secondary | ICD-10-CM

## 2019-11-03 NOTE — Telephone Encounter (Signed)
Alright. I have Discontinued Lyrica on his chart. And added back Gabapentin 600mg  tablets take 2 for 1200mg  3 times a day.  Please check with patient to see if he needs a new re order on Gabapentin 600mg  tablets - it would be 6 pills per day, and if he wants 30 day or 90 day fill.  Or if he has enough medicine at this time.  Nobie Putnam, [facility] 11/03/2019, 11:51 AM

## 2019-11-03 NOTE — Telephone Encounter (Signed)
Copied from [place] [id number]. Topic: General - Other >> Nov 03, 2019  8:59 AM Leward Quan A wrote: Reason for CRM: Patient called to inform Dr Parks Ranger that he is not tolerating the pregabalin (LYRICA) 75 MG capsule very well so he will stop taking that and start back on the gabapentin (NEURONTIN) 600 MG tablet. Patient can be reached at Ph# [phone]

## 2019-11-03 NOTE — Telephone Encounter (Signed)
Unable to reach the patient left message to call back.

## 2019-11-04 MED ORDER — GABAPENTIN 600 MG PO TABS: 1200.0000 mg | ORAL_TABLET | Freq: Three times a day (TID) | ORAL | 3 refills | Status: DC

## 2019-11-04 NOTE — Telephone Encounter (Signed)
Able to speak with patient he does want Dr.K to send 90 days Rx to [place].

## 2019-11-04 NOTE — Telephone Encounter (Signed)
Sent rx gabapentin 600mg  x 2 per dose TID #540 pills +3 refill 90 day supply to [place], [facility] 11/04/2019, 1:25 PM

## 2019-11-04 NOTE — Telephone Encounter (Signed)
Pt stated he called yesterday but never received a call back. Pt stated he really needs to speak with Dr. Raliegh Ip to discuss medications.

## 2019-12-31 ENCOUNTER — Other Ambulatory Visit: Payer: PPO

## 2019-12-31 DIAGNOSIS — E1142 Type 2 diabetes mellitus with diabetic polyneuropathy: Secondary | ICD-10-CM | POA: Diagnosis not present

## 2019-12-31 DIAGNOSIS — E1169 Type 2 diabetes mellitus with other specified complication: Secondary | ICD-10-CM | POA: Diagnosis not present

## 2019-12-31 DIAGNOSIS — E785 Hyperlipidemia, unspecified: Secondary | ICD-10-CM | POA: Diagnosis not present

## 2019-12-31 DIAGNOSIS — N183 Chronic kidney disease, stage 3 unspecified: Secondary | ICD-10-CM | POA: Diagnosis not present

## 2019-12-31 DIAGNOSIS — M8949 Other hypertrophic osteoarthropathy, multiple sites: Secondary | ICD-10-CM | POA: Diagnosis not present

## 2019-12-31 DIAGNOSIS — G63 Polyneuropathy in diseases classified elsewhere: Secondary | ICD-10-CM | POA: Diagnosis not present

## 2019-12-31 DIAGNOSIS — I129 Hypertensive chronic kidney disease with stage 1 through stage 4 chronic kidney disease, or unspecified chronic kidney disease: Secondary | ICD-10-CM | POA: Diagnosis not present

## 2019-12-31 DIAGNOSIS — R351 Nocturia: Secondary | ICD-10-CM | POA: Diagnosis not present

## 2019-12-31 DIAGNOSIS — M1A0720 Idiopathic chronic gout, left ankle and foot, without tophus (tophi): Secondary | ICD-10-CM | POA: Diagnosis not present

## 2019-12-31 DIAGNOSIS — Z8546 Personal history of malignant neoplasm of prostate: Secondary | ICD-10-CM | POA: Diagnosis not present

## 2020-01-01 LAB — CBC WITH DIFFERENTIAL/PLATELET
Absolute Monocytes: 574 cells/uL (ref 200–950)
Basophils Absolute: 59 cells/uL (ref 0–200)
Basophils Relative: 0.9 %
Eosinophils Absolute: 119 cells/uL (ref 15–500)
Eosinophils Relative: 1.8 %
HCT: 50.6 % — ABNORMAL HIGH (ref 38.5–50.0)
Hemoglobin: 17 g/dL (ref 13.2–17.1)
Lymphs Abs: 2581 cells/uL (ref 850–3900)
MCH: 29.5 pg (ref 27.0–33.0)
MCHC: 33.6 g/dL (ref 32.0–36.0)
MCV: 87.8 fL (ref 80.0–100.0)
MPV: 10.3 fL (ref 7.5–12.5)
Monocytes Relative: 8.7 %
Neutro Abs: 3267 cells/uL (ref 1500–7800)
Neutrophils Relative %: 49.5 %
Platelets: 154 10*3/uL (ref 140–400)
RBC: 5.76 10*6/uL (ref 4.20–5.80)
RDW: 13 % (ref 11.0–15.0)
Total Lymphocyte: 39.1 %
WBC: 6.6 10*3/uL (ref 3.8–10.8)

## 2020-01-01 LAB — HEMOGLOBIN A1C
Hgb A1c MFr Bld: 7.7 % of total Hgb — ABNORMAL HIGH (ref <5.7)
Mean Plasma Glucose: 174 (calc)
eAG (mmol/L): 9.7 (calc)

## 2020-01-01 LAB — COMPLETE METABOLIC PANEL WITH GFR
AG Ratio: 1.6 (calc) (ref 1.0–2.5)
ALT: 16 U/L (ref 9–46)
AST: 22 U/L (ref 10–35)
Albumin: 4.5 g/dL (ref 3.6–5.1)
Alkaline phosphatase (APISO): 47 U/L (ref 35–144)
BUN/Creatinine Ratio: 23 (calc) — ABNORMAL HIGH (ref 6–22)
BUN: 27 mg/dL — ABNORMAL HIGH (ref 7–25)
CO2: 26 mmol/L (ref 20–32)
Calcium: 9.9 mg/dL (ref 8.6–10.3)
Chloride: 100 mmol/L (ref 98–110)
Creat: 1.17 mg/dL — ABNORMAL HIGH (ref 0.70–1.11)
GFR, Est African American: 66 mL/min/{1.73_m2} (ref >=60)
GFR, Est Non African American: 57 mL/min/{1.73_m2} — ABNORMAL LOW (ref >=60)
Globulin: 2.8 g/dL (calc) (ref 1.9–3.7)
Glucose, Bld: 148 mg/dL — ABNORMAL HIGH (ref 65–99)
Potassium: 4.7 mmol/L (ref 3.5–5.3)
Sodium: 136 mmol/L (ref 135–146)
Total Bilirubin: 0.6 mg/dL (ref 0.2–1.2)
Total Protein: 7.3 g/dL (ref 6.1–8.1)

## 2020-01-01 LAB — LIPID PANEL
Cholesterol: 195 mg/dL (ref <200)
HDL: 34 mg/dL — ABNORMAL LOW (ref >=40)
LDL Cholesterol (Calc): 127 mg/dL (calc) — ABNORMAL HIGH
Non-HDL Cholesterol (Calc): 161 mg/dL (calc) — ABNORMAL HIGH (ref <130)
Total CHOL/HDL Ratio: 5.7 (calc) — ABNORMAL HIGH (ref <5.0)
Triglycerides: 196 mg/dL — ABNORMAL HIGH (ref <150)

## 2020-01-01 LAB — URIC ACID: Uric Acid, Serum: 7.6 mg/dL (ref 4.0–8.0)

## 2020-01-01 LAB — PSA: PSA: <0.1 ng/mL (ref <=4.0)

## 2020-01-03 ENCOUNTER — Telehealth: Payer: Self-pay

## 2020-01-03 NOTE — Telephone Encounter (Signed)
He was seen for Gout on 10/08/2019 and has next physical appointment ob 01/07/2020.

## 2020-01-03 NOTE — Telephone Encounter (Signed)
Copied from [place] [id number]. Topic: General - Inquiry >> Jan 03, 2020 10:31 AM Gillis Ends D wrote: Reason for CRM: Patient states that his gout in his left foot has come back again on his big toe, the same place it was last time and request that some medication is called in for him along with a call back from the doctor.

## 2020-01-03 NOTE — Telephone Encounter (Signed)
Left detail message. 

## 2020-01-03 NOTE — Telephone Encounter (Signed)
He needs acute apt for left foot pain / gout.  He can keep physical on 8/13  Nobie Putnam, [facility] 01/03/2020, 12:18 PM

## 2020-01-04 ENCOUNTER — Ambulatory Visit (INDEPENDENT_AMBULATORY_CARE_PROVIDER_SITE_OTHER): Payer: PPO | Admitting: Family Medicine

## 2020-01-04 ENCOUNTER — Other Ambulatory Visit: Payer: Self-pay

## 2020-01-04 ENCOUNTER — Encounter: Payer: Self-pay | Admitting: Family Medicine

## 2020-01-04 DIAGNOSIS — M109 Gout, unspecified: Secondary | ICD-10-CM

## 2020-01-04 DIAGNOSIS — M1A0720 Idiopathic chronic gout, left ankle and foot, without tophus (tophi): Secondary | ICD-10-CM

## 2020-01-04 MED ORDER — COLCHICINE 0.6 MG PO TABS: ORAL_TABLET | ORAL | 0 refills | Status: DC

## 2020-01-04 MED ORDER — PREDNISONE 20 MG PO TABS: ORAL_TABLET | ORAL | 0 refills | Status: DC

## 2020-01-04 NOTE — Telephone Encounter (Signed)
Noted that pt. has scheduled a 10:40 AM Virtual appt. With Dr. Raliegh Ip, today.

## 2020-01-04 NOTE — Progress Notes (Signed)
Subjective:    Patient ID: [name], male    DOB: 1933/09/08, 86 y.o.   MRN: [id number]  [name] is a 86 y.o. male presenting on 01/04/2020 for Gout (left foot big toe--onset 3 days )  Virtual / Telehealth Encounter - Telephone Visit The purpose of this virtual visit is to provide medical care while limiting exposure to the novel coronavirus (COVID19) for both patient and office staff.  Consent was obtained for remote visit:  Yes.   Answered questions that patient had about telehealth interaction:  Yes.   I discussed the limitations, risks, security and privacy concerns of performing an evaluation and management service by video/telephone. I also discussed with the patient that there may be a patient responsible charge related to this service. The patient expressed understanding and agreed to proceed.  Patient Location: Home Provider Location: [facility] (Office)   HPI   ACUTE GOUT FLARE, Left Great Toe He reports this is same as March 08/09/19, treated with Prednisone with resolution for Gout Flare same location L Great toe Now, onset acute 3 days, sore to touch, Left big toe, redness, swelling unsure if better today. 12/2019 - Lab Uric Acid 7.6, with history of gout Taking Cherry juice, and improving diet Not on prophylaxis allopurinol or other uric lowering med Never on colchicine. - PMH Diabetes, CKD-III Denies any fever chills other toe or joint involvement redness, injury, fall, numbness tingling weakness   Additionally today Reports multiple skin lacerations from thin skin - he said tried vaseline, antibiotic cream   Depression screen [facility] 2/9 10/08/2019 08/09/2019 12/29/2018  Decreased Interest 0 0 0  Down, Depressed, Hopeless 0 0 0  PHQ - 2 Score 0 0 0  Altered sleeping - - -  Tired, decreased energy - - -  Change in appetite - - -  Feeling bad or failure about yourself  - - -  Trouble concentrating - - -  Moving slowly or  fidgety/restless - - -  Suicidal thoughts - - -  PHQ-9 Score - - -    Social History   Tobacco Use   Smoking status: Former Smoker    Quit date: 05/27/1978    Years since quitting: 41.6   Smokeless tobacco: Never Used  Vaping Use   Vaping Use: Never used  Substance Use Topics   Alcohol use: No    Alcohol/week: 0.0 standard drinks   Drug use: No    Review of Systems Per HPI unless specifically indicated above     Objective:    There were no vitals taken for this visit.  Wt Readings from Last 3 Encounters:  10/08/19 170 lb (77.1 kg)  12/29/18 165 lb 3.2 oz (74.9 kg)  12/29/18 165 lb 3.2 oz (74.9 kg)    Physical Exam   No physical exam completed today. Telephonic visit.  Results for orders placed or performed in visit on 12/31/19  Uric acid  Result Value Ref Range   Uric Acid, Serum 7.6 4.0 - 8.0 mg/dL  PSA  Result Value Ref Range   PSA <0.1 < OR = 4.0 ng/mL  Lipid panel  Result Value Ref Range   Cholesterol 195 <200 mg/dL   HDL 34 (L) > OR = 40 mg/dL   Triglycerides 196 (H) <150 mg/dL   LDL Cholesterol (Calc) 127 (H) mg/dL (calc)   Total CHOL/HDL Ratio 5.7 (H) <5.0 (calc)   Non-HDL Cholesterol (Calc) 161 (H) <130 mg/dL (calc)  COMPLETE METABOLIC PANEL WITH  GFR  Result Value Ref Range   Glucose, Bld 148 (H) 65 - 99 mg/dL   BUN 27 (H) 7 - 25 mg/dL   Creat 1.17 (H) 0.70 - 1.11 mg/dL   GFR, Est Non African American 57 (L) > OR = 60 mL/min/1.60m2   GFR, Est African American 66 > OR = 60 mL/min/1.31m2   BUN/Creatinine Ratio 23 (H) 6 - 22 (calc)   Sodium 136 135 - 146 mmol/L   Potassium 4.7 3.5 - 5.3 mmol/L   Chloride 100 98 - 110 mmol/L   CO2 26 20 - 32 mmol/L   Calcium 9.9 8.6 - 10.3 mg/dL   Total Protein 7.3 6.1 - 8.1 g/dL   Albumin 4.5 3.6 - 5.1 g/dL   Globulin 2.8 1.9 - 3.7 g/dL (calc)   AG Ratio 1.6 1.0 - 2.5 (calc)   Total Bilirubin 0.6 0.2 - 1.2 mg/dL   Alkaline phosphatase (APISO) 47 35 - 144 U/L   AST 22 10 - 35 U/L   ALT 16 9 - 46 U/L  CBC  with Differential/Platelet  Result Value Ref Range   WBC 6.6 3.8 - 10.8 Thousand/uL   RBC 5.76 4.20 - 5.80 Million/uL   Hemoglobin 17.0 13.2 - 17.1 g/dL   HCT 50.6 (H) 38 - 50 %   MCV 87.8 80.0 - 100.0 fL   MCH 29.5 27.0 - 33.0 pg   MCHC 33.6 32.0 - 36.0 g/dL   RDW 13.0 11.0 - 15.0 %   Platelets 154 140 - 400 Thousand/uL   MPV 10.3 7.5 - 12.5 fL   Neutro Abs 3,267 1,500 - 7,800 cells/uL   Lymphs Abs 2,581 850 - 3,900 cells/uL   Absolute Monocytes 574 200 - 950 cells/uL   Eosinophils Absolute 119 15 - 500 cells/uL   Basophils Absolute 59 0 - 200 cells/uL   Neutrophils Relative % 49.5 %   Total Lymphocyte 39.1 %   Monocytes Relative 8.7 %   Eosinophils Relative 1.8 %   Basophils Relative 0.9 %  Hemoglobin A1c  Result Value Ref Range   Hgb A1c MFr Bld 7.7 (H) <5.7 % of total Hgb   Mean Plasma Glucose 174 (calc)   eAG (mmol/L) 9.7 (calc)      Assessment & Plan:   Problem List Items Addressed This Visit    None    Visit Diagnoses    Acute gout involving toe of left foot, unspecified cause    -  Primary   Relevant Medications   colchicine 0.6 MG tablet   predniSONE (DELTASONE) 20 MG tablet   Chronic gout of left foot, unspecified cause       Relevant Medications   colchicine 0.6 MG tablet   predniSONE (DELTASONE) 20 MG tablet      Clinically consistent with acute gout flare of Left great toe MTP and foot,  (3 day ago) Temporary relief, now seems slight improved by history Known prior gout Last uric acid 7.6 (12/2019) Not on uric acid therapy Last flare 07/2019.  Plan: 1 Start Prednisone taper over 8 days again - cautions given 2. New rx Colchicine 0.6mg  - discussed dosing, use as PRN ONLY in future if new gout flare, caution with CKD-III with renal dysfunction. 2. Avoid excessive ambulation, relative rest, ice if helps, can take Tylenol PRN 3. Avoid food triggers (red meat, alcohol)  F/u as scheduled this week for routine physical, will discuss gout prevention in  future, if recurrent flares again this year likely start Allopurinol.  Meds ordered this encounter  Medications   colchicine 0.6 MG tablet    Sig: If acute flare only - Day 1: take 2 pills first day, then repeat 1 pill within two hours. Day 2 - take 1 pill daily for up to max 7 days or stop when gout flare resolves.    Dispense:  20 tablet    Refill:  0   predniSONE (DELTASONE) 20 MG tablet    Sig: Take 2 tablets (40mg ) for 3 days, then 1 tab (20mg ) for 3 days, then half tab (10mg ) for 2 days    Dispense:  10 tablet    Refill:  0      Follow up plan: Return if symptoms worsen or fail to improve, for as scheduled 8/13 for physical.  Patient verbalizes understanding with the above medical recommendations including the limitation of remote medical advice.  Specific follow-up and call-back criteria were given for patient to follow-up or seek medical care more urgently if needed.  Total duration of direct patient care provided via telephone: 20 minutes   Nobie Putnam, [facility] 01/04/2020, 11:31 AM

## 2020-01-07 ENCOUNTER — Encounter: Payer: PPO | Admitting: Family Medicine

## 2020-01-10 ENCOUNTER — Other Ambulatory Visit: Payer: Self-pay

## 2020-01-10 ENCOUNTER — Encounter: Payer: Self-pay | Admitting: Family Medicine

## 2020-01-10 ENCOUNTER — Ambulatory Visit (INDEPENDENT_AMBULATORY_CARE_PROVIDER_SITE_OTHER): Payer: PPO | Admitting: Family Medicine

## 2020-01-10 ENCOUNTER — Telehealth: Payer: Self-pay | Admitting: Family Medicine

## 2020-01-10 VITALS — BP 117/50 | HR 65 | Temp 97.5°F | Resp 16 | Ht 70.0 in | Wt 163.0 lb

## 2020-01-10 DIAGNOSIS — G720 Drug-induced myopathy: Secondary | ICD-10-CM | POA: Diagnosis not present

## 2020-01-10 DIAGNOSIS — M8949 Other hypertrophic osteoarthropathy, multiple sites: Secondary | ICD-10-CM | POA: Diagnosis not present

## 2020-01-10 DIAGNOSIS — M159 Polyosteoarthritis, unspecified: Secondary | ICD-10-CM

## 2020-01-10 DIAGNOSIS — E1169 Type 2 diabetes mellitus with other specified complication: Secondary | ICD-10-CM

## 2020-01-10 DIAGNOSIS — E1142 Type 2 diabetes mellitus with diabetic polyneuropathy: Secondary | ICD-10-CM

## 2020-01-10 DIAGNOSIS — Z0000 Encounter for general adult medical examination without abnormal findings: Secondary | ICD-10-CM

## 2020-01-10 DIAGNOSIS — N183 Chronic kidney disease, stage 3 unspecified: Secondary | ICD-10-CM | POA: Diagnosis not present

## 2020-01-10 DIAGNOSIS — E785 Hyperlipidemia, unspecified: Secondary | ICD-10-CM

## 2020-01-10 DIAGNOSIS — G63 Polyneuropathy in diseases classified elsewhere: Secondary | ICD-10-CM

## 2020-01-10 DIAGNOSIS — I129 Hypertensive chronic kidney disease with stage 1 through stage 4 chronic kidney disease, or unspecified chronic kidney disease: Secondary | ICD-10-CM

## 2020-01-10 DIAGNOSIS — M150 Primary generalized (osteo)arthritis: Secondary | ICD-10-CM

## 2020-01-10 MED ORDER — EZETIMIBE 10 MG PO TABS: 10.0000 mg | ORAL_TABLET | Freq: Every day | ORAL | 3 refills | Status: DC

## 2020-01-10 MED ORDER — ZETIA 10 MG PO TABS: 10.0000 mg | ORAL_TABLET | Freq: Every day | ORAL | 3 refills | Status: DC

## 2020-01-10 MED ORDER — LISINOPRIL 5 MG PO TABS: 5.0000 mg | ORAL_TABLET | Freq: Every day | ORAL | 3 refills | Status: DC

## 2020-01-10 NOTE — Patient Instructions (Addendum)
Thank you for coming to the office today.  Call us with your Moderna vaccine dates - give [place] both dates and we will update the chart. Thanks!  Recommend Flu Vaccine (high dose for age 84+) check with [place].  Increase Metformin from 1 pill daily back up to 2 pills daily.  Recent Labs    12/31/19 0922  HGBA1C 7.7*    May take daily multivitamin daily can help improve wound healing.  Please schedule a Follow-up Appointment to: Return in about 6 months (around 07/12/2020) for 6 month DM A1c, HTN, Gout.  If you have any other questions or concerns, please feel free to call the office or send a message through [place]. You may also schedule an earlier appointment if necessary.  Additionally, you may be receiving a survey about your experience at our office within a few days to 1 week by e-mail or mail. We value your feedback.  Nobie Putnam, DO [place]

## 2020-01-10 NOTE — Telephone Encounter (Signed)
Copied from [place] [phone]. Topic: General - Other >> Jan 10, 2020  2:52 PM Hinda Lenis D wrote: Covid vaccine dates first dose 06/29/19 / second dose 07/27/19

## 2020-01-10 NOTE — Telephone Encounter (Signed)
[place] in [place] is calling to let Dr. Raliegh Ip know that the ZETIA 10 MG tablet [[id number]] is over a $1000 is there another medication that would like to replace? CB- [phone]

## 2020-01-10 NOTE — Telephone Encounter (Signed)
Zetia Rx that was ordered earlier was sent for Brand name only, which is why it was not covered. It should have been generic. I called the pharmacy and clarified with them generic is covered. Sent new rx.  Also I have abstracted the Moderna COVID dates now on his chart.  Nobie Putnam, [facility] 01/10/2020, 5:38 PM

## 2020-01-10 NOTE — Progress Notes (Signed)
Subjective:    Patient ID: [name], male    DOB: Aug 04, 1933, 86 y.o.   MRN: [id number]  [name] is a 86 y.o. male presenting on 01/10/2020 for Sinus Problem (cough, nasal congestion, runny nose, scrathy throat getting better now denies fever --getting improved on prednizone which he was taking for gout) and Annual Exam   HPI  Here for Annual Physical and Lab Review.   CHRONIC DM, Type 2 with DM Neuropathy: CKD-III Creatinine improved to 1.17, previous 1.4 Admits corn in summer, higher sugar Prior A1c 6-7 range. He is doing well CBGs:Checks fasting AM CBG 1-3 x weekly -avg 100-120. No low of < 100 Meds: Metformin XR 500mg  x 2 = 1000mg  daily Reports good compliance. Tolerating well w/o side-effects Currently on ACEi- he is asking about dose Lifestyle: - Diet (Improved diet, more fruit vegetables, low carb, limiting sweets) - Exercise (still regular exercise regimen, walking and home exercise regimen) Previous - Dr Phoebe Sharps Exam in past Summer 2021 he is unsure date, we will request report - For neuropathy, previously trial on Lyrica unsuccessful, now he has switched back to and still taking Gabapentin 1200mg  TID- seems tohelphis symptoms, without side effects, still has some refractory symptomsagain only with stinging and burning but has reduced sensation affecting his balance Denies hypoglycemia, polyuria, visual changes   Additional complaint  Sinusitis / Allergies / post nasal drainage  Gout Follow-up L foot MTP gout flare Recently flared up, resolving with prednisone now. Prior flare 1-2 times in 2021 Now has colchicine PRN Not on allopurinol Improved diet resolving now nearly off prednisone  Health Maintenance: UTD COVID19 vaccine, he will call us back with dates  Depression screen [facility] 2/9 01/10/2020 10/08/2019 08/09/2019  Decreased Interest 0 0 0  Down, Depressed, Hopeless 0 0 0  PHQ - 2 Score 0 0 0  Altered sleeping - - -  Tired,  decreased energy - - -  Change in appetite - - -  Feeling bad or failure about yourself  - - -  Trouble concentrating - - -  Moving slowly or fidgety/restless - - -  Suicidal thoughts - - -  PHQ-9 Score - - -    Past Medical History:  Diagnosis Date  . Diabetes mellitus without complication ([place])   . Hyperlipidemia   . Hypertension   . Neuropathy   . Prostate cancer [facility])    Past Surgical History:  Procedure Laterality Date  . CATARACT EXTRACTION W/PHACO Left 04/24/2015   Procedure: CATARACT EXTRACTION PHACO AND INTRAOCULAR LENS PLACEMENT (IOC);  Surgeon: Estill Cotta, MD;  Location: [facility] ORS;  Service: Ophthalmology;  Laterality: Left;  [place] 01:12 AP% 25.7 CDE 28.30 fluid pack lot #[id number]  . COLONOSCOPY     Social History   Socioeconomic History  . Marital status: Widowed    Spouse name: Not on file  . Number of children: Not on file  . Years of education: Not on file  . Highest education level: High school graduate  Occupational History  . Not on file  Tobacco Use  . Smoking status: Former Smoker    Quit date: 05/27/1978    Years since quitting: 41.6  . Smokeless tobacco: Never Used  Vaping Use  . Vaping Use: Never used  Substance and Sexual Activity  . Alcohol use: No    Alcohol/week: 0.0 standard drinks  . Drug use: No  . Sexual activity: Not on file  Other Topics Concern  . Not on file  Social  History Narrative   Lives in retirement community    Social Determinants of Health   Financial Resource Strain:   . Difficulty of Paying Living Expenses:   Food Insecurity:   . Worried About Charity fundraiser in the Last Year:   . Arboriculturist in the Last Year:   Transportation Needs:   . Film/video editor (Medical):   Marland Kitchen Lack of Transportation (Non-Medical):   Physical Activity:   . Days of Exercise per Week:   . Minutes of Exercise per Session:   Stress:   . Feeling of Stress :   Social Connections:   . Frequency of Communication with  Friends and Family:   . Frequency of Social Gatherings with Friends and Family:   . Attends Religious Services:   . Active Member of Clubs or Organizations:   . Attends Archivist Meetings:   Marland Kitchen Marital Status:   Intimate Partner Violence:   . Fear of Current or Ex-Partner:   . Emotionally Abused:   Marland Kitchen Physically Abused:   . Sexually Abused:    Family History  Problem Relation Age of Onset  . Stroke Mother   . Cancer Mother        colon cancer  . COPD Father   . COPD Brother   . Hearing loss Son    Current Outpatient Medications on File Prior to Visit  Medication Sig  . Alpha Lipoic Acid 200 MG CAPS Take 200 mg by mouth 3 (three) times daily.  . colchicine 0.6 MG tablet If acute flare only - Day 1: take 2 pills first day, then repeat 1 pill within two hours. Day 2 - take 1 pill daily for up to max 7 days or stop when gout flare resolves.  . DUREZOL 0.05 % EMUL Place 0.16 application into the left eye as needed.  . gabapentin (NEURONTIN) 600 MG tablet Take 2 tablets (1,200 mg total) by mouth 3 (three) times daily.  Marland Kitchen glucosamine-chondroitin 500-400 MG tablet Take 1 tablet by mouth 3 (three) times daily as needed.   . metFORMIN (GLUCOPHAGE-XR) 500 MG 24 hr tablet Take 2 tablets (1,000 mg total) by mouth daily with breakfast.  . VOLTAREN 1 % GEL 4 GRAMS, Transdermal, four times daily, as needed   No current facility-administered medications on file prior to visit.    Review of Systems  Constitutional: Negative for activity change, appetite change, chills, diaphoresis, fatigue and fever.  HENT: Negative for congestion and hearing loss.   Eyes: Negative for visual disturbance.  Respiratory: Negative for apnea, cough, chest tightness, shortness of breath and wheezing.   Cardiovascular: Negative for chest pain, palpitations and leg swelling.  Gastrointestinal: Negative for abdominal pain, anal bleeding, blood in stool, constipation, diarrhea, nausea and vomiting.  Endocrine:  Negative for cold intolerance.  Genitourinary: Negative for decreased urine volume, difficulty urinating, dysuria, frequency, hematuria and urgency.  Musculoskeletal: Negative for arthralgias, back pain and neck pain.  Skin: Negative for rash.  Allergic/Immunologic: Positive for environmental allergies.  Neurological: Negative for dizziness, weakness, light-headedness, numbness and headaches.  Hematological: Negative for adenopathy.  Psychiatric/Behavioral: Negative for behavioral problems, dysphoric mood and sleep disturbance. The patient is not nervous/anxious.    Per HPI unless specifically indicated above      Objective:    BP (!) 117/50   Pulse 65   Temp (!) 97.5 F (36.4 C) (Temporal)   Resp 16   Ht 5\' 10"  (1.778 m)   Wt 163 lb (73.9  kg)   SpO2 96%   BMI 23.39 kg/m   Wt Readings from Last 3 Encounters:  01/10/20 163 lb (73.9 kg)  10/08/19 170 lb (77.1 kg)  12/29/18 165 lb 3.2 oz (74.9 kg)    Physical Exam Vitals and nursing note reviewed.  Constitutional:      General: He is not in acute distress.    Appearance: He is well-developed. He is not diaphoretic.     Comments: Well-appearing, comfortable, cooperative  HENT:     Head: Normocephalic and atraumatic.  Eyes:     General:        Right eye: No discharge.        Left eye: No discharge.     Conjunctiva/sclera: Conjunctivae normal.     Pupils: Pupils are equal, round, and reactive to light.  Neck:     Thyroid: No thyromegaly.  Cardiovascular:     Rate and Rhythm: Normal rate and regular rhythm.     Heart sounds: Normal heart sounds. No murmur heard.   Pulmonary:     Effort: Pulmonary effort is normal. No respiratory distress.     Breath sounds: Normal breath sounds. No wheezing or rales.  Abdominal:     General: Bowel sounds are normal. There is no distension.     Palpations: Abdomen is soft. There is no mass.     Tenderness: There is no abdominal tenderness.  Musculoskeletal:        General: No  tenderness. Normal range of motion.     Cervical back: Normal range of motion and neck supple.     Comments: Upper / Lower Extremities: - Normal muscle tone, strength bilateral upper extremities 5/5, lower extremities 5/5  Left great toe MTP area with mostly resolved slightly edema erythema resolving.  Lymphadenopathy:     Cervical: No cervical adenopathy.  Skin:    General: Skin is warm and dry.     Findings: No erythema or rash.  Neurological:     Mental Status: He is alert and oriented to person, place, and time.     Comments: Distal sensation intact to light touch all extremities  Psychiatric:        Behavior: Behavior normal.     Comments: Well groomed, good eye contact, normal speech and thoughts       Results for orders placed or performed in visit on 12/31/19  Uric acid  Result Value Ref Range   Uric Acid, Serum 7.6 4.0 - 8.0 mg/dL  PSA  Result Value Ref Range   PSA <0.1 < OR = 4.0 ng/mL  Lipid panel  Result Value Ref Range   Cholesterol 195 <200 mg/dL   HDL 34 (L) > OR = 40 mg/dL   Triglycerides 196 (H) <150 mg/dL   LDL Cholesterol (Calc) 127 (H) mg/dL (calc)   Total CHOL/HDL Ratio 5.7 (H) <5.0 (calc)   Non-HDL Cholesterol (Calc) 161 (H) <130 mg/dL (calc)  COMPLETE METABOLIC PANEL WITH GFR  Result Value Ref Range   Glucose, Bld 148 (H) 65 - 99 mg/dL   BUN 27 (H) 7 - 25 mg/dL   Creat 1.17 (H) 0.70 - 1.11 mg/dL   GFR, Est Non African American 57 (L) > OR = 60 mL/min/1.46m2   GFR, Est African American 66 > OR = 60 mL/min/1.39m2   BUN/Creatinine Ratio 23 (H) 6 - 22 (calc)   Sodium 136 135 - 146 mmol/L   Potassium 4.7 3.5 - 5.3 mmol/L   Chloride 100 98 - 110 mmol/L  CO2 26 20 - 32 mmol/L   Calcium 9.9 8.6 - 10.3 mg/dL   Total Protein 7.3 6.1 - 8.1 g/dL   Albumin 4.5 3.6 - 5.1 g/dL   Globulin 2.8 1.9 - 3.7 g/dL (calc)   AG Ratio 1.6 1.0 - 2.5 (calc)   Total Bilirubin 0.6 0.2 - 1.2 mg/dL   Alkaline phosphatase (APISO) 47 35 - 144 U/L   AST 22 10 - 35 U/L    ALT 16 9 - 46 U/L  CBC with Differential/Platelet  Result Value Ref Range   WBC 6.6 3.8 - 10.8 Thousand/uL   RBC 5.76 4.20 - 5.80 Million/uL   Hemoglobin 17.0 13.2 - 17.1 g/dL   HCT 50.6 (H) 38 - 50 %   MCV 87.8 80.0 - 100.0 fL   MCH 29.5 27.0 - 33.0 pg   MCHC 33.6 32.0 - 36.0 g/dL   RDW 13.0 11.0 - 15.0 %   Platelets 154 140 - 400 Thousand/uL   MPV 10.3 7.5 - 12.5 fL   Neutro Abs 3,267 1,500 - 7,800 cells/uL   Lymphs Abs 2,581 850 - 3,900 cells/uL   Absolute Monocytes 574 200 - 950 cells/uL   Eosinophils Absolute 119 15 - 500 cells/uL   Basophils Absolute 59 0 - 200 cells/uL   Neutrophils Relative % 49.5 %   Total Lymphocyte 39.1 %   Monocytes Relative 8.7 %   Eosinophils Relative 1.8 %   Basophils Relative 0.9 %  Hemoglobin A1c  Result Value Ref Range   Hgb A1c MFr Bld 7.7 (H) <5.7 % of total Hgb   Mean Plasma Glucose 174 (calc)   eAG (mmol/L) 9.7 (calc)      Assessment & Plan:   Problem List Items Addressed This Visit    Type 2 diabetes mellitus with diabetic polyneuropathy (HCC)    A1c 7.7 mild elevated, near goal 7-8 Complications - CKD-III, peripheral neuropathy, other including hyperlipidemia specifically hypertriglyceridemia - increases risk of future cardiovascular complications    Plan:  1. Metformin XR 500mg  - now should increase to 500 x 2 in AM if tolerated 2. Encourage improved lifestyle - low carb, low sugar diet, reduce portion size, continue improving regular exercise 3. Continue ACEi 4. Continue Gabapentin, now off Lyrica 5. F/u with Dr Phoebe Sharps DM Foot exam done today      Relevant Medications   lisinopril (ZESTRIL) 5 MG tablet   Primary osteoarthritis involving multiple joints    Underlying etiology of chronic pain affecting multiple joints Relatively stable without flare Continue Voltaren topical diclofenac cream Avoid oral NSAID Continue Tylenol Follow-up in future if pursue knee steroid injections - he may return to provider in [place]  if interested in other injections - may benefit from future Ortho ref but he declines surgery at this time      Polyneuropathy associated with underlying disease ([place])    Chronic peripheral polyneuropathy Likely secondary to DM Failed gabapentin, alpha lipoic acid, Lyrica  Goal to trial next med option with Lyrica as discussed On Gabapentin regular dosing, off Lyrica      Hyperlipidemia associated with type 2 diabetes mellitus (HCC)    Elevated Lipids off zetia Last lipid panel 12/2019 Calculated ASCVD 10 yr risk score elevated History of statin intolerance  Plan: 1. RESTART generic zetia 10mg  - defer future statin, branded was high cost, change to generic 2. Encourage improved lifestyle - low carb/cholesterol, reduce portion size, continue improving regular exercise      Relevant Medications   lisinopril (  ZESTRIL) 5 MG tablet   Drug-induced myopathy    Statin induced myopathy failed rosuvastatin, simvastatin Now on Zetia, will reorder      Benign hypertension with CKD (chronic kidney disease) stage III    Well controlled HTN - except rare low readings, due to hydration it seems Complication: CKD-III Improved Cr  Plan: 1. Continue Lisinopril 5mg  daily 2. Improve hydration, avoid NSAIDs may use Tylenol and topical NSAID, lifestyle regular exercise, low sodium 3. Monitor BP outside office if he can occasionally 4. Follow-up 6 months      Relevant Medications   lisinopril (ZESTRIL) 5 MG tablet    Other Visit Diagnoses    Annual physical exam    -  Primary      Updated Health Maintenance information Reviewed recent lab results with patient Encouraged improvement to lifestyle with diet and exercise - Goal of weight loss  #Gout Follow up in future, Uric acid elevated May consider Allopurinol, for now use Colchicine PRN flare   Meds ordered this encounter  Medications  . lisinopril (ZESTRIL) 5 MG tablet    Sig: Take 1 tablet (5 mg total) by mouth daily.     Dispense:  90 tablet    Refill:  3  . DISCONTD: ZETIA 10 MG tablet    Sig: Take 1 tablet (10 mg total) by mouth at bedtime.    Dispense:  90 tablet    Refill:  3      Follow up plan: Return in about 6 months (around 07/12/2020) for 6 month DM A1c, HTN, Gout.  Nobie Putnam, [facility] 01/10/2020, 2:11 PM

## 2020-01-11 NOTE — Assessment & Plan Note (Signed)
A1c 7.7 mild elevated, near goal 7-8 Complications - CKD-III, peripheral neuropathy, other including hyperlipidemia specifically hypertriglyceridemia - increases risk of future cardiovascular complications    Plan:  1. Metformin XR 500mg  - now should increase to 500 x 2 in AM if tolerated 2. Encourage improved lifestyle - low carb, low sugar diet, reduce portion size, continue improving regular exercise 3. Continue ACEi 4. Continue Gabapentin, now off Lyrica 5. F/u with Dr Phoebe Sharps DM Foot exam done today

## 2020-01-11 NOTE — Assessment & Plan Note (Addendum)
Elevated Lipids off zetia Last lipid panel 12/2019 Calculated ASCVD 10 yr risk score elevated History of statin intolerance  Plan: 1. RESTART generic zetia 10mg  - defer future statin, branded was high cost, change to generic 2. Encourage improved lifestyle - low carb/cholesterol, reduce portion size, continue improving regular exercise

## 2020-01-11 NOTE — Assessment & Plan Note (Signed)
Statin induced myopathy failed rosuvastatin, simvastatin Now on Zetia, will reorder

## 2020-01-11 NOTE — Assessment & Plan Note (Signed)
Well controlled HTN - except rare low readings, due to hydration it seems Complication: CKD-III Improved Cr  Plan: 1. Continue Lisinopril 5mg  daily 2. Improve hydration, avoid NSAIDs may use Tylenol and topical NSAID, lifestyle regular exercise, low sodium 3. Monitor BP outside office if he can occasionally 4. Follow-up 6 months

## 2020-01-11 NOTE — Assessment & Plan Note (Addendum)
Chronic peripheral polyneuropathy Likely secondary to DM Failed gabapentin, alpha lipoic acid, Lyrica  Goal to trial next med option with Lyrica as discussed On Gabapentin regular dosing, off Lyrica

## 2020-01-11 NOTE — Assessment & Plan Note (Addendum)
Underlying etiology of chronic pain affecting multiple joints Relatively stable without flare Continue Voltaren topical diclofenac cream Avoid oral NSAID Continue Tylenol Follow-up in future if pursue knee steroid injections - he may return to provider in [place] if interested in other injections - may benefit from future Ortho ref but he declines surgery at this time

## 2020-01-20 ENCOUNTER — Encounter: Payer: Self-pay | Admitting: Family Medicine

## 2020-02-19 ENCOUNTER — Other Ambulatory Visit: Payer: Self-pay | Admitting: Family Medicine

## 2020-02-19 DIAGNOSIS — E1142 Type 2 diabetes mellitus with diabetic polyneuropathy: Secondary | ICD-10-CM

## 2020-02-19 NOTE — Telephone Encounter (Signed)
Requested Prescriptions  Pending Prescriptions Disp Refills  . metFORMIN (GLUCOPHAGE-XR) 500 MG 24 hr tablet [Pharmacy Med Name: METFORMIN HCL ER 500 MG TABLET] 180 tablet 1    Sig: Take 2 tablets (1,000 mg total) by mouth daily with breakfast.     Endocrinology:  Diabetes - Biguanides Failed - 02/19/2020 11:04 AM      Failed - Cr in normal range and within 360 days    Creat  Date Value Ref Range Status  12/31/2019 1.17 (H) 0.70 - 1.11 mg/dL Final    Comment:    For patients >13 years of age, the reference limit for Creatinine is approximately 13% higher for people identified as African-American. .          Passed - HBA1C is between 0 and 7.9 and within 180 days    Hgb A1c MFr Bld  Date Value Ref Range Status  12/31/2019 7.7 (H) <5.7 % of total Hgb Final    Comment:    For someone without known diabetes, a hemoglobin A1c value of 6.5% or greater indicates that they may have  diabetes and this should be confirmed with a follow-up  test. . For someone with known diabetes, a value <7% indicates  that their diabetes is well controlled and a value  greater than or equal to 7% indicates suboptimal  control. A1c targets should be individualized based on  duration of diabetes, age, comorbid conditions, and  other considerations. . Currently, no consensus exists regarding use of hemoglobin A1c for diagnosis of diabetes for children. .          Passed - eGFR in normal range and within 360 days    GFR, Est African American  Date Value Ref Range Status  12/31/2019 66 > OR = 60 mL/min/1.69m Final   GFR, Est Non African American  Date Value Ref Range Status  12/31/2019 57 (L) > OR = 60 mL/min/1.721mFinal         Passed - Valid encounter within last 6 months    Recent Outpatient Visits          1 month ago Annual physical exam   [facility]   1 month ago Acute gout involving toe of left foot, unspecified cause   [place]   [facility]   6 months ago Acute gout involving toe of left foot, unspecified cause   [facility]   1 year ago Annual physical exam   [place]      Future Appointments            [facility]

## 2020-02-22 ENCOUNTER — Telehealth: Payer: Self-pay | Admitting: Family Medicine

## 2020-02-22 NOTE — Telephone Encounter (Signed)
I left a message asking the pt to call and schedule virtual AWV with [place].

## 2020-03-02 DIAGNOSIS — H2511 Age-related nuclear cataract, right eye: Secondary | ICD-10-CM | POA: Diagnosis not present

## 2020-03-02 DIAGNOSIS — E119 Type 2 diabetes mellitus without complications: Secondary | ICD-10-CM | POA: Diagnosis not present

## 2020-03-02 DIAGNOSIS — H35373 Puckering of macula, bilateral: Secondary | ICD-10-CM | POA: Diagnosis not present

## 2020-04-28 ENCOUNTER — Encounter: Payer: Self-pay | Admitting: Family Medicine

## 2020-04-28 ENCOUNTER — Telehealth (INDEPENDENT_AMBULATORY_CARE_PROVIDER_SITE_OTHER): Payer: PPO | Admitting: Family Medicine

## 2020-04-28 ENCOUNTER — Other Ambulatory Visit: Payer: Self-pay

## 2020-04-28 DIAGNOSIS — M79601 Pain in right arm: Secondary | ICD-10-CM

## 2020-04-28 MED ORDER — PREDNISONE 50 MG PO TABS: ORAL_TABLET | ORAL | 0 refills | Status: DC

## 2020-04-28 NOTE — Progress Notes (Signed)
Virtual Visit via Telephone  The purpose of this virtual visit is to provide medical care while limiting exposure to the novel coronavirus (COVID19) for both patient and office staff.  Consent was obtained for phone visit:  Yes.   Answered questions that patient had about telehealth interaction:  Yes.   I discussed the limitations, risks, security and privacy concerns of performing an evaluation and management service by telephone. I also discussed with the patient that there may be a patient responsible charge related to this service. The patient expressed understanding and agreed to proceed.  Patient is at home and is accessed via telephone Services are provided by Harlin Rain, FNP-C from [facility])  ---------------------------------------------------------------------- Chief Complaint  Patient presents with  . Arm Pain    Rt shoulder pain that have radiate down into the bicep x 1 day. The pt state he got a flu shot two weeks ago and unsure if it is related to the injection. Pt just started noticing pain in his left arm x 2 days ago    S: Reviewed CMA documentation. I have called patient and gathered additional HPI as follows:  Mr. Olheiser presents for telemedicine visit via telephone for concerns of right shoulder pain that radiates down into his right bicep a day after his influenza vaccine and has continued to get worse.  Has been taking ibuprofen nightly and that has helped his symptoms but has not made the pain go away.  Some trouble sleeping on the right shoulder.  Reports is unable to put pressure on the shoulder.  Since the pain has started, has had a loss of range of motion in the right arm.  Reports some grip strength change.  Denies numbness/tingling.    Patient is currently home Denies any high risk travel to areas of current concern for COVID19. Denies any known or suspected exposure to person with or possibly with COVID19.  Past Medical  History:  Diagnosis Date  . Diabetes mellitus without complication ([place])   . Hyperlipidemia   . Hypertension   . Neuropathy   . Prostate cancer [facility])    Social History   Tobacco Use  . Smoking status: Former Smoker    Quit date: 05/27/1978    Years since quitting: 41.9  . Smokeless tobacco: Never Used  Vaping Use  . Vaping Use: Never used  Substance Use Topics  . Alcohol use: No    Alcohol/week: 0.0 standard drinks  . Drug use: No    Current Outpatient Medications:  .  Alpha Lipoic Acid 200 MG CAPS, Take 200 mg by mouth 3 (three) times daily., Disp: 90 capsule, Rfl: 0 .  colchicine 0.6 MG tablet, If acute flare only - Day 1: take 2 pills first day, then repeat 1 pill within two hours. Day 2 - take 1 pill daily for up to max 7 days or stop when gout flare resolves., Disp: 20 tablet, Rfl: 0 .  gabapentin (NEURONTIN) 600 MG tablet, Take 2 tablets (1,200 mg total) by mouth 3 (three) times daily., Disp: 540 tablet, Rfl: 3 .  glucosamine-chondroitin 500-400 MG tablet, Take 1 tablet by mouth 3 (three) times daily as needed. , Disp: , Rfl:  .  lisinopril (ZESTRIL) 5 MG tablet, Take 1 tablet (5 mg total) by mouth daily., Disp: 90 tablet, Rfl: 3 .  metFORMIN (GLUCOPHAGE-XR) 500 MG 24 hr tablet, Take 2 tablets (1,000 mg total) by mouth daily with breakfast., Disp: 180 tablet, Rfl: 1 .  VOLTAREN  1 % GEL, 4 GRAMS, Transdermal, four times daily, as needed, Disp: 100 g, Rfl: 3 .  ezetimibe (ZETIA) 10 MG tablet, Take 1 tablet (10 mg total) by mouth daily. (Patient not taking: Reported on 04/28/2020), Disp: 90 tablet, Rfl: 3 .  predniSONE (DELTASONE) 50 MG tablet, Take 1 tablet daily x 5 days, Disp: 5 tablet, Rfl: 0  Depression screen [facility] 2/9 01/10/2020 10/08/2019 08/09/2019  Decreased Interest 0 0 0  Down, Depressed, Hopeless 0 0 0  PHQ - 2 Score 0 0 0  Altered sleeping - - -  Tired, decreased energy - - -  Change in appetite - - -  Feeling bad or failure about yourself  - - -  Trouble  concentrating - - -  Moving slowly or fidgety/restless - - -  Suicidal thoughts - - -  PHQ-9 Score - - -    No flowsheet data found.  -------------------------------------------------------------------------- O: No physical exam performed due to remote telephone encounter.  Physical Exam: Patient remotely monitored without video.  Verbal communication appropriate.  Cognition normal.  No results found for this or any previous visit (from the past 2160 hour(s)).  -------------------------------------------------------------------------- A&P:  Problem List Items Addressed This Visit      Other   Right arm pain - Primary    Right arm pain started within 1 day of influenza vaccination.  Likely nerve irritation.  Has been favoring left arm since pain has started and reports some loss of ROM in the right arm and decreased grip strength.  Discussed treatment with prednisone daily x 5 days, and to use a hand towel on a wall, walking the right up up the wall a few times per day to help with regaining range of motion to prevent continued loss of ROM.  Encouraged to RTC if symptoms worsen or fail to improve.      Relevant Medications   predniSONE (DELTASONE) 50 MG tablet      Meds ordered this encounter  Medications  . predniSONE (DELTASONE) 50 MG tablet    Sig: Take 1 tablet daily x 5 days    Dispense:  5 tablet    Refill:  0    Follow-up: - Return if symptoms worsen or fail to improve  Patient verbalizes understanding with the above medical recommendations including the limitation of remote medical advice.  Specific follow-up and call-back criteria were given for patient to follow-up or seek medical care more urgently if needed.  - Time spent in direct consultation with patient on phone: 8 minutes  Harlin Rain, [facility] 04/28/2020, 5:03 PM

## 2020-04-28 NOTE — Assessment & Plan Note (Signed)
Right arm pain started within 1 day of influenza vaccination.  Likely nerve irritation.  Has been favoring left arm since pain has started and reports some loss of ROM in the right arm and decreased grip strength.  Discussed treatment with prednisone daily x 5 days, and to use a hand towel on a wall, walking the right up up the wall a few times per day to help with regaining range of motion to prevent continued loss of ROM.  Encouraged to RTC if symptoms worsen or fail to improve.

## 2020-05-01 DIAGNOSIS — H40013 Open angle with borderline findings, low risk, bilateral: Secondary | ICD-10-CM | POA: Diagnosis not present

## 2020-05-01 DIAGNOSIS — H2511 Age-related nuclear cataract, right eye: Secondary | ICD-10-CM | POA: Diagnosis not present

## 2020-05-01 DIAGNOSIS — H524 Presbyopia: Secondary | ICD-10-CM | POA: Diagnosis not present

## 2020-05-01 DIAGNOSIS — H25011 Cortical age-related cataract, right eye: Secondary | ICD-10-CM | POA: Diagnosis not present

## 2020-05-01 DIAGNOSIS — E119 Type 2 diabetes mellitus without complications: Secondary | ICD-10-CM | POA: Diagnosis not present

## 2020-05-01 LAB — HM DIABETES EYE EXAM

## 2020-05-08 ENCOUNTER — Encounter (INDEPENDENT_AMBULATORY_CARE_PROVIDER_SITE_OTHER): Payer: Self-pay | Admitting: Ophthalmology

## 2020-05-09 ENCOUNTER — Encounter: Payer: Self-pay | Admitting: Family Medicine

## 2020-06-01 ENCOUNTER — Other Ambulatory Visit: Payer: Self-pay

## 2020-06-01 ENCOUNTER — Telehealth: Payer: PPO | Admitting: Family Medicine

## 2020-06-02 ENCOUNTER — Telehealth (INDEPENDENT_AMBULATORY_CARE_PROVIDER_SITE_OTHER): Payer: PPO | Admitting: Family Medicine

## 2020-06-02 ENCOUNTER — Other Ambulatory Visit: Payer: Self-pay

## 2020-06-02 ENCOUNTER — Encounter: Payer: Self-pay | Admitting: Family Medicine

## 2020-06-02 DIAGNOSIS — J0110 Acute frontal sinusitis, unspecified: Secondary | ICD-10-CM | POA: Diagnosis not present

## 2020-06-02 DIAGNOSIS — M79601 Pain in right arm: Secondary | ICD-10-CM | POA: Diagnosis not present

## 2020-06-02 MED ORDER — AMOXICILLIN-POT CLAVULANATE 875-125 MG PO TABS: 1.0000 | ORAL_TABLET | Freq: Two times a day (BID) | ORAL | 0 refills | Status: DC

## 2020-06-02 MED ORDER — IPRATROPIUM BROMIDE 0.06 % NA SOLN: 2.0000 | Freq: Four times a day (QID) | NASAL | 0 refills | Status: DC

## 2020-06-02 NOTE — Progress Notes (Signed)
Virtual Visit via Telephone The purpose of this virtual visit is to provide medical care while limiting exposure to the novel coronavirus (COVID19) for both patient and office staff.  Consent was obtained for phone visit:  Yes.   Answered questions that patient had about telehealth interaction:  Yes.   I discussed the limitations, risks, security and privacy concerns of performing an evaluation and management service by telephone. I also discussed with the patient that there may be a patient responsible charge related to this service. The patient expressed understanding and agreed to proceed.  Patient Location: Home Provider Location: [facility] (Office)  Participants in virtual visit: - Patient: [name]. [name] - CMA: Frederich Cha, CMA - Provider: Dr Parks Ranger  ---------------------------------------------------------------------- Chief Complaint  Patient presents with  . Nasal Congestion    HA but denies cough or fever onset couple of weeks OTC not improving tested negative couple of time in past --patient's wife was positive and got infusion treatment     S: Reviewed CMA documentation. I have called patient and gathered additional HPI as follows:  Sinusitis Reports that symptoms started a few weeks ago, with sinus pressure and congestion, he has temporarily improved then worsened again symptoms returned, his wife tested positive about less than 1 week ago, she received an IV antibody infusion 3 days ago. He has been keeping separate from his wife at home. - Tried OTC medications  Denies any fevers, chills, sweats, body ache, cough, shortness of breath, abdominal pain, diarrhea  Additionally still complains of R shoulder/arm pain. He was seen for virtual 12/3 onset after injection Flu shot. He took prednisone and it did not resolve.  Up to date on COVID vaccine and Flu Shot.  Past Medical History:  Diagnosis Date  . Diabetes mellitus without complication  ([place])   . Hyperlipidemia   . Hypertension   . Neuropathy   . Prostate cancer [facility])    Social History   Tobacco Use  . Smoking status: Former Smoker    Quit date: 05/27/1978    Years since quitting: 42.0  . Smokeless tobacco: Never Used  Vaping Use  . Vaping Use: Never used  Substance Use Topics  . Alcohol use: No    Alcohol/week: 0.0 standard drinks  . Drug use: No    Current Outpatient Medications:  .  Alpha Lipoic Acid 200 MG CAPS, Take 200 mg by mouth 3 (three) times daily., Disp: 90 capsule, Rfl: 0 .  amoxicillin-clavulanate (AUGMENTIN) 875-125 MG tablet, Take 1 tablet by mouth 2 (two) times daily., Disp: 20 tablet, Rfl: 0 .  colchicine 0.6 MG tablet, If acute flare only - Day 1: take 2 pills first day, then repeat 1 pill within two hours. Day 2 - take 1 pill daily for up to max 7 days or stop when gout flare resolves., Disp: 20 tablet, Rfl: 0 .  ezetimibe (ZETIA) 10 MG tablet, Take 1 tablet (10 mg total) by mouth daily., Disp: 90 tablet, Rfl: 3 .  gabapentin (NEURONTIN) 600 MG tablet, Take 2 tablets (1,200 mg total) by mouth 3 (three) times daily., Disp: 540 tablet, Rfl: 3 .  glucosamine-chondroitin 500-400 MG tablet, Take 1 tablet by mouth 3 (three) times daily as needed. , Disp: , Rfl:  .  ipratropium (ATROVENT) 0.06 % nasal spray, Place 2 sprays into both nostrils 4 (four) times daily. For up to 5-7 days then stop., Disp: 15 mL, Rfl: 0 .  lisinopril (ZESTRIL) 5 MG tablet, Take 1 tablet (5  mg total) by mouth daily., Disp: 90 tablet, Rfl: 3 .  metFORMIN (GLUCOPHAGE-XR) 500 MG 24 hr tablet, Take 2 tablets (1,000 mg total) by mouth daily with breakfast., Disp: 180 tablet, Rfl: 1 .  VOLTAREN 1 % GEL, 4 GRAMS, Transdermal, four times daily, as needed, Disp: 100 g, Rfl: 3  Depression screen [facility] 2/9 06/02/2020 01/10/2020 10/08/2019  Decreased Interest 0 0 0  Down, Depressed, Hopeless 0 0 0  PHQ - 2 Score 0 0 0  Altered sleeping - - -  Tired, decreased energy - - -  Change in appetite  - - -  Feeling bad or failure about yourself  - - -  Trouble concentrating - - -  Moving slowly or fidgety/restless - - -  Suicidal thoughts - - -  PHQ-9 Score - - -    No flowsheet data found.  -------------------------------------------------------------------------- O: No physical exam performed due to remote telephone encounter.  Lab results reviewed.  Recent Results (from the past 2160 hour(s))  HM DIABETES EYE EXAM     Status: None   Collection Time: 05/01/20 12:00 AM  Result Value Ref Range   HM Diabetic Eye Exam No Retinopathy No Retinopathy    -------------------------------------------------------------------------- A&P:  Problem List Items Addressed This Visit    Right arm pain    Other Visit Diagnoses    Acute non-recurrent frontal sinusitis    -  Primary   Relevant Medications   ipratropium (ATROVENT) 0.06 % nasal spray   amoxicillin-clavulanate (AUGMENTIN) 875-125 MG tablet     Consistent with acute frontal rhinosinusitis, likely initially viral URI vs allergic rhinitis component with worsening concern for bacterial infection now few weeks duration 2nd sickening. - At risk of COVID. He is vaccinated fully with booster, but close contact at home wife with covid.   Plan: 1. Start Augmentin 875-125mg  PO BID x 10 days 2. Start Atrovent nasal spray decongestant 2 sprays in each nostril up to 4 times daily for 7 days 3. Supportive medication 4. May repeat COVID testing based on symptoms Return criteria reviewed   Meds ordered this encounter  Medications  . ipratropium (ATROVENT) 0.06 % nasal spray    Sig: Place 2 sprays into both nostrils 4 (four) times daily. For up to 5-7 days then stop.    Dispense:  15 mL    Refill:  0  . amoxicillin-clavulanate (AUGMENTIN) 875-125 MG tablet    Sig: Take 1 tablet by mouth 2 (two) times daily.    Dispense:  20 tablet    Refill:  0    Follow-up: - Return as needed  Patient verbalizes understanding with the above  medical recommendations including the limitation of remote medical advice.  Specific follow-up and call-back criteria were given for patient to follow-up or seek medical care more urgently if needed.   - Time spent in direct consultation with patient on phone: 9 minutes   Nobie Putnam, [employer] [facility] 06/02/2020, 8:33 AM

## 2020-06-02 NOTE — Patient Instructions (Addendum)
Plan: 1. Start Augmentin 875-125mg  PO BID x 10 days 2. Start Atrovent nasal spray decongestant 2 sprays in each nostril up to 4 times daily for 7 days 3. Supportive medication 4. May repeat COVID testing based on symptoms Return criteria reviewed   Please schedule a Follow-up Appointment to: Return in about 1 week (around 06/09/2020), or if symptoms worsen or fail to improve, for sinusitis.  If you have any other questions or concerns, please feel free to call the office or send a message through [place]. You may also schedule an earlier appointment if necessary.  Additionally, you may be receiving a survey about your experience at our office within a few days to 1 week by e-mail or mail. We value your feedback.  Nobie Putnam, DO [place]

## 2020-06-12 ENCOUNTER — Encounter (INDEPENDENT_AMBULATORY_CARE_PROVIDER_SITE_OTHER): Payer: Self-pay | Admitting: Ophthalmology

## 2020-07-12 ENCOUNTER — Ambulatory Visit: Payer: PPO | Admitting: Family Medicine

## 2020-07-19 ENCOUNTER — Encounter: Payer: Self-pay | Admitting: Family Medicine

## 2020-07-19 ENCOUNTER — Other Ambulatory Visit: Payer: Self-pay

## 2020-07-19 ENCOUNTER — Ambulatory Visit (INDEPENDENT_AMBULATORY_CARE_PROVIDER_SITE_OTHER): Payer: PPO | Admitting: Family Medicine

## 2020-07-19 VITALS — BP 133/89 | HR 74 | Ht 70.0 in | Wt 154.4 lb

## 2020-07-19 DIAGNOSIS — I129 Hypertensive chronic kidney disease with stage 1 through stage 4 chronic kidney disease, or unspecified chronic kidney disease: Secondary | ICD-10-CM | POA: Diagnosis not present

## 2020-07-19 DIAGNOSIS — E785 Hyperlipidemia, unspecified: Secondary | ICD-10-CM

## 2020-07-19 DIAGNOSIS — E1169 Type 2 diabetes mellitus with other specified complication: Secondary | ICD-10-CM | POA: Diagnosis not present

## 2020-07-19 DIAGNOSIS — N183 Chronic kidney disease, stage 3 unspecified: Secondary | ICD-10-CM | POA: Diagnosis not present

## 2020-07-19 DIAGNOSIS — E1142 Type 2 diabetes mellitus with diabetic polyneuropathy: Secondary | ICD-10-CM

## 2020-07-19 LAB — POCT GLYCOSYLATED HEMOGLOBIN (HGB A1C): Hemoglobin A1C: 6.1 % — AB (ref 4.0–5.6)

## 2020-07-19 MED ORDER — METFORMIN HCL ER 500 MG PO TB24: 500.0000 mg | ORAL_TABLET | Freq: Every day | ORAL | 1 refills | Status: DC

## 2020-07-19 NOTE — Patient Instructions (Addendum)
Thank you for coming to the office today.  Please bring [place] a copy of your COVID vaccine card to scan to system.  Recent Labs    12/31/19 0922 07/19/20 1110  HGBA1C 7.7* 6.1*    REDUCE Metformin XR from 2 pills or 1000mg  daily, down to 1 pill daily 500mg . New order sent.  REDUCE Gabapentin from 2 pills 3 times a day, down by 1 less pill a day, every 1-2 weeks. Keep adjusting this and eventually down to 1 pill 3 times a day or 2 pills twice a day. Keep reducing.  DUE for FASTING BLOOD WORK (no food or drink after midnight before the lab appointment, only water or coffee without cream/sugar on the morning of)  SCHEDULE "Lab Only" visit in the morning at the clinic for lab draw in 6 MONTHS   - Make sure Lab Only appointment is at about 1 week before your next appointment, so that results will be available  For Lab Results, once available within 2-3 days of blood draw, you can can log in to MyChart online to view your results and a brief explanation. Also, we can discuss results at next follow-up visit.    Please schedule a Follow-up Appointment to: Return in about 6 months (around 01/16/2021) for 6 month fasting lab only then 1 week later Annual Physical.  If you have any other questions or concerns, please feel free to call the office or send a message through [place]. You may also schedule an earlier appointment if necessary.  Additionally, you may be receiving a survey about your experience at our office within a few days to 1 week by e-mail or mail. We value your feedback.  Nobie Putnam, DO [place]

## 2020-07-19 NOTE — Progress Notes (Unsigned)
Subjective:    Patient ID: [name], male    DOB: 1934-01-30, 86 y.o.   MRN: [id number]  [name] is a 87 y.o. male presenting on 07/19/2020 for Diabetes, Hypertension, and Gout   HPI  Type 2 Diabetes Hyperlipidemia Hypertension  Now followed by - Specialist Chronic Conditions ([place]) Involving neuropathy treatment He is doing supplements and red light therapy, TENS machine Diet improvement similar to Keto diet. No sugars / grains / dairy. He uses almond milk A1c down to 6.1%, major improvement from prior 6.5 to 9 range in past.  Home CBG log - 130 - 120 - 127 - 100 - 99 - 108 - 105 - low 87 - 93 - 90 - 95 - 120  Dietary supplement Nitric Oxide Nerve support  Journal on food Exercise regimen  Balance improvement.  Asking about Reducing Metformin and Gabapentin.   Depression screen [facility] 2/9 06/02/2020 01/10/2020 10/08/2019  Decreased Interest 0 0 0  Down, Depressed, Hopeless 0 0 0  PHQ - 2 Score 0 0 0  Altered sleeping - - -  Tired, decreased energy - - -  Change in appetite - - -  Feeling bad or failure about yourself  - - -  Trouble concentrating - - -  Moving slowly or fidgety/restless - - -  Suicidal thoughts - - -  PHQ-9 Score - - -    Social History   Tobacco Use  . Smoking status: Former Smoker    Quit date: 05/27/1978    Years since quitting: 42.1  . Smokeless tobacco: Never Used  Vaping Use  . Vaping Use: Never used  Substance Use Topics  . Alcohol use: No    Alcohol/week: 0.0 standard drinks  . Drug use: No    Review of Systems Per HPI unless specifically indicated above     Objective:    BP 133/89   Pulse 74   Ht 5\' 10"  (1.778 m)   Wt 154 lb 6.4 oz (70 kg)   SpO2 99%   BMI 22.15 kg/m   Wt Readings from Last 3 Encounters:  07/19/20 154 lb 6.4 oz (70 kg)  01/10/20 163 lb (73.9 kg)  10/08/19 170 lb (77.1 kg)    Physical Exam Vitals and nursing note reviewed.  Constitutional:      General: He is not in  acute distress.    Appearance: He is well-developed and well-nourished. He is not diaphoretic.     Comments: Well-appearing, comfortable, cooperative  HENT:     Head: Normocephalic and atraumatic.     Mouth/Throat:     Mouth: Oropharynx is clear and moist.  Eyes:     General:        Right eye: No discharge.        Left eye: No discharge.     Conjunctiva/sclera: Conjunctivae normal.  Cardiovascular:     Rate and Rhythm: Normal rate.  Pulmonary:     Effort: Pulmonary effort is normal.  Musculoskeletal:        General: No edema.  Skin:    General: Skin is warm and dry.     Findings: No erythema or rash.  Neurological:     Mental Status: He is alert and oriented to person, place, and time.  Psychiatric:        Mood and Affect: Mood and affect normal.        Behavior: Behavior normal.     Comments: Well groomed, good eye contact, normal  speech and thoughts      Recent Labs    12/31/19 0922 07/19/20 1110  HGBA1C 7.7* 6.1*    Results for orders placed or performed in visit on 07/19/20  POCT HgB A1C  Result Value Ref Range   Hemoglobin A1C 6.1 (A) 4.0 - 5.6 %      Assessment & Plan:   Problem List Items Addressed This Visit    Type 2 diabetes mellitus with diabetic polyneuropathy ([place]) - Primary    Dramatic improved A1c to 6.1 now on lifestyle overhaul Complications - CKD-III, peripheral neuropathy, other including hyperlipidemia specifically hypertriglyceridemia - increases risk of future cardiovascular complications    Plan:  1. REDUCE Metformin XR 500mg  - to 1 tab instead of 2 daily 2. Encourage improved lifestyle - low carb, low sugar diet, reduce portion size, continue improving regular exercise 3. Continue ACEi 4. Continue Gabapentin - reduce dosage as advised 5. F/u with Dr Phoebe Sharps      Relevant Medications   metFORMIN (GLUCOPHAGE-XR) 500 MG 24 hr tablet   gabapentin (NEURONTIN) 600 MG tablet   Other Relevant Orders   POCT HgB A1C (Completed)    Hyperlipidemia associated with type 2 diabetes mellitus ([place])    Last lipid panel 12/2019 Calculated ASCVD 10 yr risk score elevated History of statin intolerance  Plan: 1. Continue Zetia 2. Encourage improved lifestyle - low carb/cholesterol, reduce portion size, continue improving regular exercise      Relevant Medications   metFORMIN (GLUCOPHAGE-XR) 500 MG 24 hr tablet   Benign hypertension with CKD (chronic kidney disease) stage III ([place])    Well controlled HTN Complication: CKD-III Improved Cr  Plan: 1. Continue Lisinopril 5mg  daily 2. Improve hydration, avoid NSAIDs may use Tylenol and topical NSAID, lifestyle regular exercise, low sodium 3. Monitor BP outside office if he can occasionally 4. Follow-up 6 months           Meds ordered this encounter  Medications  . metFORMIN (GLUCOPHAGE-XR) 500 MG 24 hr tablet    Sig: Take 1 tablet (500 mg total) by mouth daily with breakfast.    Dispense:  90 tablet    Refill:  1    Dose reduction from 2 to 1 a day      Follow up plan: Return in about 6 months (around 01/16/2021) for 6 month fasting lab only then 1 week later Annual Physical.  Future labs ordered for 12/2020  [name], [place] [facility] 07/19/2020, 11:10 AM

## 2020-07-20 ENCOUNTER — Other Ambulatory Visit: Payer: Self-pay | Admitting: Family Medicine

## 2020-07-20 DIAGNOSIS — M159 Polyosteoarthritis, unspecified: Secondary | ICD-10-CM

## 2020-07-20 DIAGNOSIS — E785 Hyperlipidemia, unspecified: Secondary | ICD-10-CM

## 2020-07-20 DIAGNOSIS — Z8546 Personal history of malignant neoplasm of prostate: Secondary | ICD-10-CM

## 2020-07-20 DIAGNOSIS — R351 Nocturia: Secondary | ICD-10-CM

## 2020-07-20 DIAGNOSIS — I129 Hypertensive chronic kidney disease with stage 1 through stage 4 chronic kidney disease, or unspecified chronic kidney disease: Secondary | ICD-10-CM

## 2020-07-20 DIAGNOSIS — E1169 Type 2 diabetes mellitus with other specified complication: Secondary | ICD-10-CM

## 2020-07-20 DIAGNOSIS — E1142 Type 2 diabetes mellitus with diabetic polyneuropathy: Secondary | ICD-10-CM

## 2020-07-20 DIAGNOSIS — M8949 Other hypertrophic osteoarthropathy, multiple sites: Secondary | ICD-10-CM

## 2020-07-20 NOTE — Assessment & Plan Note (Signed)
Well controlled HTN Complication: CKD-III Improved Cr  Plan: 1. Continue Lisinopril 5mg daily 2. Improve hydration, avoid NSAIDs may use Tylenol and topical NSAID, lifestyle regular exercise, low sodium 3. Monitor BP outside office if he can occasionally 4. Follow-up 6 months 

## 2020-07-20 NOTE — Assessment & Plan Note (Addendum)
Last lipid panel 12/2019 Calculated ASCVD 10 yr risk score elevated History of statin intolerance  Plan: 1. Continue Zetia 2. Encourage improved lifestyle - low carb/cholesterol, reduce portion size, continue improving regular exercise

## 2020-07-20 NOTE — Assessment & Plan Note (Signed)
Dramatic improved A1c to 6.1 now on lifestyle overhaul Complications - CKD-III, peripheral neuropathy, other including hyperlipidemia specifically hypertriglyceridemia - increases risk of future cardiovascular complications    Plan:  1. REDUCE Metformin XR 500mg  - to 1 tab instead of 2 daily 2. Encourage improved lifestyle - low carb, low sugar diet, reduce portion size, continue improving regular exercise 3. Continue ACEi 4. Continue Gabapentin - reduce dosage as advised 5. F/u with Dr Phoebe Sharps

## 2020-08-18 ENCOUNTER — Other Ambulatory Visit: Payer: Self-pay | Admitting: Family Medicine

## 2020-08-18 ENCOUNTER — Other Ambulatory Visit: Payer: Self-pay

## 2020-08-18 DIAGNOSIS — M109 Gout, unspecified: Secondary | ICD-10-CM

## 2020-08-18 MED ORDER — COLCHICINE 0.6 MG PO TABS: ORAL_TABLET | ORAL | 0 refills | Status: DC

## 2020-08-18 NOTE — Telephone Encounter (Signed)
  Notes to clinic: this script is expired Review for continue use and refill    Requested Prescriptions  Pending Prescriptions Disp Refills   colchicine 0.6 MG tablet [Pharmacy Med Name: COLCHICINE 0.6 MG TABLET] 20 tablet 0    Sig: If acute flare only - Day 1: take 2 pills first day, then repeat 1 pill within two hours. Day 2 - take 1 pill daily for up to max 7 days or stop when gout flare resolves.      Endocrinology:  Gout Agents Failed - 08/18/2020  9:26 AM      Failed - Cr in normal range and within 360 days    Creat  Date Value Ref Range Status  12/31/2019 1.17 (H) 0.70 - 1.11 mg/dL Final    Comment:    For patients >44 years of age, the reference limit for Creatinine is approximately 13% higher for people identified as African-American. .           Passed - Uric Acid in normal range and within 360 days    Uric Acid, Serum  Date Value Ref Range Status  12/31/2019 7.6 4.0 - 8.0 mg/dL Final    Comment:    Therapeutic target for gout patients: <6.0 mg/dL .           Passed - Valid encounter within last 12 months    Recent Outpatient Visits           1 month ago Type 2 diabetes mellitus with diabetic polyneuropathy, without long-term current use of insulin ([place])   [facility] [place], Devonne Doughty, DO   2 months ago Acute non-recurrent frontal sinusitis   [place], DO   3 months ago Right arm pain   [place], FNP   7 months ago Annual physical exam   [place], DO   7 months ago Acute gout involving toe of left foot, unspecified cause   [place], DO       Future Appointments             In 5 months [place], Devonne Doughty, DO [facility], [facility]

## 2020-08-29 DIAGNOSIS — S4990XA Unspecified injury of shoulder and upper arm, unspecified arm, initial encounter: Secondary | ICD-10-CM | POA: Diagnosis not present

## 2020-09-14 ENCOUNTER — Other Ambulatory Visit: Payer: Self-pay | Admitting: Family Medicine

## 2020-09-14 DIAGNOSIS — J0110 Acute frontal sinusitis, unspecified: Secondary | ICD-10-CM

## 2020-10-24 ENCOUNTER — Ambulatory Visit (INDEPENDENT_AMBULATORY_CARE_PROVIDER_SITE_OTHER): Payer: PPO | Admitting: Family Medicine

## 2020-10-24 ENCOUNTER — Other Ambulatory Visit: Payer: Self-pay

## 2020-10-24 ENCOUNTER — Encounter: Payer: Self-pay | Admitting: Family Medicine

## 2020-10-24 VITALS — BP 105/62 | HR 71 | Ht 70.0 in | Wt 150.4 lb

## 2020-10-24 DIAGNOSIS — M79601 Pain in right arm: Secondary | ICD-10-CM | POA: Diagnosis not present

## 2020-10-24 DIAGNOSIS — G8929 Other chronic pain: Secondary | ICD-10-CM

## 2020-10-24 DIAGNOSIS — M7541 Impingement syndrome of right shoulder: Secondary | ICD-10-CM

## 2020-10-24 NOTE — Progress Notes (Signed)
Subjective:    Patient ID: OUSMAN DISE, male    DOB: 1933/11/26, 87 y.o.   MRN: [id number]  ISACK LAVALLEY is a 87 y.o. male presenting on 10/24/2020 for Shoulder Pain   HPI  Chronic Right Shoulder vs Arm Pain  Reports chronic problem since several months, has aching pain in upper arm. He has had some weight loss overall and says working on building back muscle and strength.  He has followed with chiropractor and has had X-rays, limited improvement overall. Showed mild DJD AC joint.  Today describes pain interfere with activities at times with raising arm and lifting. Asking about further evaluation and imaging.  Denies any trauma or injury to Right Shoulder recently = but he does have a prior history of prior fall injury 10-15 years ago seen at [facility], no further problems since that time. He got his range of motion back from exercise    Depression screen [facility] 2/9 10/24/2020 06/02/2020 01/10/2020  Decreased Interest 0 0 0  Down, Depressed, Hopeless 0 0 0  PHQ - 2 Score 0 0 0  Altered sleeping 0 - -  Tired, decreased energy 0 - -  Change in appetite 0 - -  Feeling bad or failure about yourself  0 - -  Trouble concentrating 0 - -  Moving slowly or fidgety/restless 0 - -  Suicidal thoughts 0 - -  PHQ-9 Score 0 - -  Difficult doing work/chores Not difficult at all - -    Social History   Tobacco Use  . Smoking status: Former Smoker    Quit date: 05/27/1978    Years since quitting: 42.4  . Smokeless tobacco: Never Used  Vaping Use  . Vaping Use: Never used  Substance Use Topics  . Alcohol use: No    Alcohol/week: 0.0 standard drinks  . Drug use: No    Review of Systems Per HPI unless specifically indicated above     Objective:    BP 105/62   Pulse 71   Ht 5\' 10"  (1.778 m)   Wt 150 lb 6.4 oz (68.2 kg)   SpO2 97%   BMI 21.58 kg/m   Wt Readings from Last 3 Encounters:  10/24/20 150 lb 6.4 oz (68.2 kg)  07/19/20 154 lb 6.4 oz (70 kg)  01/10/20 163 lb  (73.9 kg)    Physical Exam Vitals and nursing note reviewed.  Constitutional:      General: He is not in acute distress.    Appearance: He is well-developed. He is not diaphoretic.     Comments: Well-appearing, comfortable, cooperative  HENT:     Head: Normocephalic and atraumatic.  Eyes:     General:        Right eye: No discharge.        Left eye: No discharge.     Conjunctiva/sclera: Conjunctivae normal.  Cardiovascular:     Rate and Rhythm: Normal rate.  Pulmonary:     Effort: Pulmonary effort is normal.  Musculoskeletal:     Comments: RIGHT Shoulder Inspection: Normal appearance bilateral symmetrical Palpation: Non-tender to palpation over anterior, lateral, or posterior shoulder  ROM: Reduced range of motion forward flex due to pain above shoulder with impingement, has mostly intact internal rotation behind back. Special Testing: Rotator cuff testing with positive pain without weakness. Impinagement testing positive. Strength: Normal strength 5/5 flex/ext, ext rot / int rot, grip, rotator cuff str testing. Neurovascular: Distally intact pulses, sensation to light touch   Skin:  General: Skin is warm and dry.     Findings: No erythema or rash.  Neurological:     Mental Status: He is alert and oriented to person, place, and time.  Psychiatric:        Behavior: Behavior normal.     Comments: Well groomed, good eye contact, normal speech and thoughts     I have personally reviewed the radiology report  XR Shoulder Min 2 Views Right  Anatomical Region Laterality Modality  -- -- Computed Radiography    Impression Performed by [id number] IMPRESSION: There is no evidence of acute osseous abnormality.   Electronically Signed by: Charlett Lango on 08/29/2020 1:49 PM  Narrative Performed by [id number] This result has an attachment that is not available.  EXAM: XR SHOULDER MIN 2 VIEWS RIGHT   INDICATION: Unspecified injury of shoulder and upper arm, unspecified arm, initial  encounter   COMPARISON: None   FINDINGS: The [facility] joint is intact and the subacromial space is maintained. No acute fracture or dislocation. No osseous erosions. Mild degenerative changes are present at the [facility] joint and greater tuberosity, as well as the glenohumeral joint. The overlying soft tissues are unremarkable. Thoracic spondylosis is seen.  Procedure Note  Duayne Cal, MD - 08/29/2020  Formatting of this note might be different from the original.  EXAM: XR SHOULDER MIN 2 VIEWS RIGHT   INDICATION: Unspecified injury of shoulder and upper arm, unspecified arm, initial encounter   COMPARISON: None   FINDINGS: The [facility] joint is intact and the subacromial space is maintained. No acute fracture or dislocation. No osseous erosions. Mild degenerative changes are present at the [facility] joint and greater tuberosity, as well as the glenohumeral joint. The overlying soft tissues are unremarkable. Thoracic spondylosis is seen.    IMPRESSION: There is no evidence of acute osseous abnormality.   Electronically Signed by: Charlett Lango on 08/29/2020 1:49 PM  Resulting Agency Comment  [id number] Exam End: 08/29/20 12:20 PM   Specimen Collected: 08/29/20 1:47 PM Last Resulted: 08/29/20 1:49 PM  Received From: [facility]  Result Received: 10/24/20 2:00 PM     Results for orders placed or performed in visit on 07/19/20  POCT HgB A1C  Result Value Ref Range   Hemoglobin A1C 6.1 (A) 4.0 - 5.6 %      Assessment & Plan:   Problem List Items Addressed This Visit   None   Visit Diagnoses    Chronic pain of right upper extremity    -  Primary   Relevant Orders   Ambulatory referral to Orthopedic Surgery   Shoulder impingement, right       Relevant Orders   Ambulatory referral to Orthopedic Surgery      Consistent with Chronic R-shoulder bursitis vs rotator cuff tendinopathy with some reduced active ROM but without significant evidence of muscle tear (no weakness).  Known repetitive  overhead/strenuous activity as likely etiology  No clear etiology of injury. 87 yr old patient with likely underlying arthritis - Recent imaging from chiropractor  Plan: 1. Referral to Orthopedics for further management, may need cortisone injection and other treatment vs future MRI 2. May take Tylenol Ex Str 1-2 q 6 hr PRN / Diclofenac PRN 3. Relative rest but keep shoulder mobile, demonstrated ROM exercises, avoid heavy lifting 4. May try heating pad PRN  Orders Placed This Encounter  Procedures  . Ambulatory referral to Orthopedic Surgery    Referral Priority:   Routine    Referral Type:  Surgical    Referral Reason:   Specialty Services Required    Requested Specialty:   Orthopedic Surgery    Number of Visits Requested:   1     No orders of the defined types were placed in this encounter.     Follow up plan: Return if symptoms worsen or fail to improve.   Nobie Putnam, [facility] 10/24/2020, 2:17 PM

## 2020-10-24 NOTE — Patient Instructions (Addendum)
Thank you for coming to the office today.  [facility] [place], [place]  [address] Phone: [phone]  STay tuned for apt for Right Shoulder. May need injection or MRI or other options.  START anti inflammatory topical - OTC Voltaren (generic Diclofenac) topical 2-4 times a day as needed for pain swelling of affected joint for 1-2 weeks or longer.   Please schedule a Follow-up Appointment to: Return if symptoms worsen or fail to improve.  If you have any other questions or concerns, please feel free to call the office or send a message through [place]. You may also schedule an earlier appointment if necessary.  Additionally, you may be receiving a survey about your experience at our office within a few days to 1 week by e-mail or mail. We value your feedback.  Nobie Putnam, DO [place]

## 2020-11-06 ENCOUNTER — Other Ambulatory Visit: Payer: Self-pay | Admitting: Family Medicine

## 2020-11-06 DIAGNOSIS — M7541 Impingement syndrome of right shoulder: Secondary | ICD-10-CM | POA: Diagnosis not present

## 2020-11-06 DIAGNOSIS — M25511 Pain in right shoulder: Secondary | ICD-10-CM | POA: Diagnosis not present

## 2020-11-06 DIAGNOSIS — E1142 Type 2 diabetes mellitus with diabetic polyneuropathy: Secondary | ICD-10-CM

## 2020-11-06 NOTE — Telephone Encounter (Signed)
Requested medication (s) are due for refill today: -  Requested medication (s) are on the active medication list: "historical med"  Last refill:  07/19/20 #270 3 RF  Future visit scheduled: yes  Notes to clinic:  historical med   Requested Prescriptions  Pending Prescriptions Disp Refills   gabapentin (NEURONTIN) 600 MG tablet [Pharmacy Med Name: GABAPENTIN 600 MG TABLET] 540 tablet 0    Sig: Take 2 tablets (1,200 mg total) by mouth 3 (three) times daily.      Neurology: Anticonvulsants - gabapentin Passed - 11/06/2020  9:21 AM      Passed - Valid encounter within last 12 months    Recent Outpatient Visits           1 week ago Chronic pain of right upper extremity   [place], DO   3 months ago Type 2 diabetes mellitus with diabetic polyneuropathy, without long-term current use of insulin [facility])   [facility], Devonne Doughty, DO   5 months ago Acute non-recurrent frontal sinusitis   [place], DO   6 months ago Right arm pain   [facility], Lupita Raider, FNP   10 months ago Annual physical exam   [place], DO       Future Appointments             In 2 months Parks [place], Devonne Doughty, [facility], [facility]

## 2020-11-08 ENCOUNTER — Other Ambulatory Visit: Payer: Self-pay | Admitting: Family Medicine

## 2020-11-08 DIAGNOSIS — J0110 Acute frontal sinusitis, unspecified: Secondary | ICD-10-CM

## 2020-11-08 NOTE — Telephone Encounter (Signed)
Requested medication (s) are due for refill today: yes  Requested medication (s) are on the active medication list: yes   Last refill:  09/14/2020  Future visit scheduled:  yes   Notes to clinic:  Medication not assigned to a protocol, review manually Requested Prescriptions  Pending Prescriptions Disp Refills   ipratropium (ATROVENT) 0.06 % nasal spray [Pharmacy Med Name: IPRATROPIUM 0.06% SPRAY] 15 mL 0    Sig: Place 2 sprays into both nostrils 4 (four) times daily. For up to 5-7 days then stop.      Off-Protocol Failed - 11/08/2020  9:00 AM      Failed - Medication not assigned to a protocol, review manually.      Passed - Valid encounter within last 12 months    Recent Outpatient Visits           2 weeks ago Chronic pain of right upper extremity   [facility] [place], Devonne Doughty, DO   3 months ago Type 2 diabetes mellitus with diabetic polyneuropathy, without long-term current use of insulin [facility])   [facility] [place], Devonne Doughty, DO   5 months ago Acute non-recurrent frontal sinusitis   [place], DO   6 months ago Right arm pain   [facility], Lupita Raider, [place]   10 months ago Annual physical exam   [place], DO       Future Appointments             In 2 months [place], Devonne Doughty, [facility], [facility]            Off-Protocol Failed - 11/08/2020  9:00 AM      Failed - Medication not assigned to a protocol, review manually.      Passed - Valid encounter within last 12 months    Recent Outpatient Visits           2 weeks ago Chronic pain of right upper extremity   [facility] [place], Devonne Doughty, DO   3 months ago Type 2 diabetes mellitus with diabetic polyneuropathy, without long-term current use of insulin [facility])   [facility] [place], Devonne Doughty, DO    5 months ago Acute non-recurrent frontal sinusitis   [place], DO   6 months ago Right arm pain   [facility], Lupita Raider, FNP   10 months ago Annual physical exam   [facility] Olin Hauser, DO       Future Appointments             In 2 months Parks Ranger, Devonne Doughty, [facility], [facility]

## 2020-11-23 ENCOUNTER — Telehealth: Payer: Self-pay | Admitting: Family Medicine

## 2020-11-23 NOTE — Telephone Encounter (Signed)
Copied from [place] [phone]. Topic: Medicare AWV >> Nov 23, 2020 11:51 AM Cher Nakai R wrote: Reason for CRM:  Left message for patient to call back and schedule the Medicare Annual Wellness Visit (AWV) virtually or by telephone.  Last AWV  12/29/2018  Please schedule at anytime with [facility].  40 minute appointment  Any questions, please call me at [phone]

## 2021-01-09 ENCOUNTER — Other Ambulatory Visit: Payer: Self-pay

## 2021-01-09 DIAGNOSIS — E1169 Type 2 diabetes mellitus with other specified complication: Secondary | ICD-10-CM

## 2021-01-09 DIAGNOSIS — I129 Hypertensive chronic kidney disease with stage 1 through stage 4 chronic kidney disease, or unspecified chronic kidney disease: Secondary | ICD-10-CM

## 2021-01-09 DIAGNOSIS — Z8546 Personal history of malignant neoplasm of prostate: Secondary | ICD-10-CM

## 2021-01-09 DIAGNOSIS — R351 Nocturia: Secondary | ICD-10-CM

## 2021-01-09 DIAGNOSIS — E1142 Type 2 diabetes mellitus with diabetic polyneuropathy: Secondary | ICD-10-CM

## 2021-01-09 DIAGNOSIS — M8949 Other hypertrophic osteoarthropathy, multiple sites: Secondary | ICD-10-CM

## 2021-01-09 DIAGNOSIS — M159 Polyosteoarthritis, unspecified: Secondary | ICD-10-CM

## 2021-01-10 ENCOUNTER — Other Ambulatory Visit: Payer: PPO

## 2021-01-10 DIAGNOSIS — E1169 Type 2 diabetes mellitus with other specified complication: Secondary | ICD-10-CM | POA: Diagnosis not present

## 2021-01-10 DIAGNOSIS — N183 Chronic kidney disease, stage 3 unspecified: Secondary | ICD-10-CM | POA: Diagnosis not present

## 2021-01-10 DIAGNOSIS — I129 Hypertensive chronic kidney disease with stage 1 through stage 4 chronic kidney disease, or unspecified chronic kidney disease: Secondary | ICD-10-CM | POA: Diagnosis not present

## 2021-01-10 DIAGNOSIS — M8949 Other hypertrophic osteoarthropathy, multiple sites: Secondary | ICD-10-CM | POA: Diagnosis not present

## 2021-01-10 DIAGNOSIS — E1142 Type 2 diabetes mellitus with diabetic polyneuropathy: Secondary | ICD-10-CM | POA: Diagnosis not present

## 2021-01-10 DIAGNOSIS — Z8546 Personal history of malignant neoplasm of prostate: Secondary | ICD-10-CM | POA: Diagnosis not present

## 2021-01-10 DIAGNOSIS — E785 Hyperlipidemia, unspecified: Secondary | ICD-10-CM | POA: Diagnosis not present

## 2021-01-10 DIAGNOSIS — R351 Nocturia: Secondary | ICD-10-CM | POA: Diagnosis not present

## 2021-01-11 ENCOUNTER — Other Ambulatory Visit: Payer: Self-pay

## 2021-01-11 LAB — CBC WITH DIFFERENTIAL/PLATELET
Absolute Monocytes: 741 cells/uL (ref 200–950)
Basophils Absolute: 70 cells/uL (ref 0–200)
Basophils Relative: 0.9 %
Eosinophils Absolute: 172 cells/uL (ref 15–500)
Eosinophils Relative: 2.2 %
HCT: 47.5 % (ref 38.5–50.0)
Hemoglobin: 15.8 g/dL (ref 13.2–17.1)
Lymphs Abs: 3167 cells/uL (ref 850–3900)
MCH: 29.6 pg (ref 27.0–33.0)
MCHC: 33.3 g/dL (ref 32.0–36.0)
MCV: 89 fL (ref 80.0–100.0)
MPV: 10.2 fL (ref 7.5–12.5)
Monocytes Relative: 9.5 %
Neutro Abs: 3650 cells/uL (ref 1500–7800)
Neutrophils Relative %: 46.8 %
Platelets: 156 10*3/uL (ref 140–400)
RBC: 5.34 10*6/uL (ref 4.20–5.80)
RDW: 13.1 % (ref 11.0–15.0)
Total Lymphocyte: 40.6 %
WBC: 7.8 10*3/uL (ref 3.8–10.8)

## 2021-01-11 LAB — COMPLETE METABOLIC PANEL WITH GFR
AG Ratio: 1.6 (calc) (ref 1.0–2.5)
ALT: 5 U/L — ABNORMAL LOW (ref 9–46)
AST: 12 U/L (ref 10–35)
Albumin: 4.2 g/dL (ref 3.6–5.1)
Alkaline phosphatase (APISO): 53 U/L (ref 35–144)
BUN/Creatinine Ratio: 23 (calc) — ABNORMAL HIGH (ref 6–22)
BUN: 27 mg/dL — ABNORMAL HIGH (ref 7–25)
CO2: 31 mmol/L (ref 20–32)
Calcium: 9.7 mg/dL (ref 8.6–10.3)
Chloride: 99 mmol/L (ref 98–110)
Creat: 1.17 mg/dL (ref 0.70–1.22)
Globulin: 2.7 g/dL (calc) (ref 1.9–3.7)
Glucose, Bld: 108 mg/dL (ref 65–139)
Potassium: 4.8 mmol/L (ref 3.5–5.3)
Sodium: 137 mmol/L (ref 135–146)
Total Bilirubin: 0.5 mg/dL (ref 0.2–1.2)
Total Protein: 6.9 g/dL (ref 6.1–8.1)
eGFR: 61 mL/min/{1.73_m2} (ref >=60)

## 2021-01-11 LAB — TSH: TSH: 2.21 mIU/L (ref 0.40–4.50)

## 2021-01-11 LAB — HEMOGLOBIN A1C
Hgb A1c MFr Bld: 6 % of total Hgb — ABNORMAL HIGH (ref <5.7)
Mean Plasma Glucose: 126 mg/dL
eAG (mmol/L): 7 mmol/L

## 2021-01-11 LAB — LIPID PANEL
Cholesterol: 206 mg/dL — ABNORMAL HIGH (ref <200)
HDL: 36 mg/dL — ABNORMAL LOW (ref >=40)
LDL Cholesterol (Calc): 130 mg/dL (calc) — ABNORMAL HIGH
Non-HDL Cholesterol (Calc): 170 mg/dL (calc) — ABNORMAL HIGH (ref <130)
Total CHOL/HDL Ratio: 5.7 (calc) — ABNORMAL HIGH (ref <5.0)
Triglycerides: 245 mg/dL — ABNORMAL HIGH (ref <150)

## 2021-01-11 LAB — PSA: PSA: 0.07 ng/mL (ref <=4.00)

## 2021-01-24 ENCOUNTER — Ambulatory Visit (INDEPENDENT_AMBULATORY_CARE_PROVIDER_SITE_OTHER): Payer: PPO | Admitting: Family Medicine

## 2021-01-24 ENCOUNTER — Other Ambulatory Visit: Payer: Self-pay

## 2021-01-24 ENCOUNTER — Encounter: Payer: Self-pay | Admitting: Family Medicine

## 2021-01-24 VITALS — BP 95/59 | HR 86 | Ht 70.0 in | Wt 147.2 lb

## 2021-01-24 DIAGNOSIS — I129 Hypertensive chronic kidney disease with stage 1 through stage 4 chronic kidney disease, or unspecified chronic kidney disease: Secondary | ICD-10-CM

## 2021-01-24 DIAGNOSIS — E1169 Type 2 diabetes mellitus with other specified complication: Secondary | ICD-10-CM | POA: Diagnosis not present

## 2021-01-24 DIAGNOSIS — E785 Hyperlipidemia, unspecified: Secondary | ICD-10-CM | POA: Diagnosis not present

## 2021-01-24 DIAGNOSIS — E1142 Type 2 diabetes mellitus with diabetic polyneuropathy: Secondary | ICD-10-CM

## 2021-01-24 DIAGNOSIS — H9193 Unspecified hearing loss, bilateral: Secondary | ICD-10-CM

## 2021-01-24 DIAGNOSIS — M8949 Other hypertrophic osteoarthropathy, multiple sites: Secondary | ICD-10-CM | POA: Diagnosis not present

## 2021-01-24 DIAGNOSIS — N183 Chronic kidney disease, stage 3 unspecified: Secondary | ICD-10-CM | POA: Diagnosis not present

## 2021-01-24 DIAGNOSIS — Z0000 Encounter for general adult medical examination without abnormal findings: Secondary | ICD-10-CM

## 2021-01-24 DIAGNOSIS — Z8546 Personal history of malignant neoplasm of prostate: Secondary | ICD-10-CM | POA: Diagnosis not present

## 2021-01-24 DIAGNOSIS — M7541 Impingement syndrome of right shoulder: Secondary | ICD-10-CM

## 2021-01-24 DIAGNOSIS — J3089 Other allergic rhinitis: Secondary | ICD-10-CM | POA: Diagnosis not present

## 2021-01-24 DIAGNOSIS — G720 Drug-induced myopathy: Secondary | ICD-10-CM

## 2021-01-24 DIAGNOSIS — M159 Polyosteoarthritis, unspecified: Secondary | ICD-10-CM

## 2021-01-24 MED ORDER — LISINOPRIL 5 MG PO TABS: 5.0000 mg | ORAL_TABLET | Freq: Every day | ORAL | 3 refills | Status: DC

## 2021-01-24 MED ORDER — FLUTICASONE PROPIONATE 50 MCG/ACT NA SUSP: 2.0000 | Freq: Every day | NASAL | 3 refills | Status: AC

## 2021-01-24 MED ORDER — GABAPENTIN 600 MG PO TABS: 600.0000 mg | ORAL_TABLET | Freq: Three times a day (TID) | ORAL | 3 refills | Status: DC

## 2021-01-24 MED ORDER — EZETIMIBE 10 MG PO TABS: 10.0000 mg | ORAL_TABLET | Freq: Every day | ORAL | 3 refills | Status: DC

## 2021-01-24 NOTE — Progress Notes (Addendum)
Subjective:    Patient ID: [name], male    DOB: May 23, 1934, 87 y.o.   MRN: [id number]  [name] is a 87 y.o. male presenting on 01/24/2021 for Annual Exam   HPI  Here for Annual Physical and Lab Review.  Type 2 Diabetes Hyperlipidemia Hypertension   He is doing supplements and red light therapy, TENS machine Diet improvement similar to Keto diet. No sugars / grains / dairy. He uses almond milk Improved A1c 6.0% He is on Metformin XR 560m daily, asks about coming off of this.  On Zetia  On Lisinopril low dose 534m  -----------------------------   Rhinitis Now the dripping has resolved. Now admits sinus pressure congestion.  R Shoulder pain / Rotator Cuff Previously [facility] Received cortisone injection previously 11/06/20  Low BP Today He plans to check it outside office.  Hearing Loss He request a referral to Audiologist.   Health Maintenance:  Hx prostate cancer. Negative surveillance. 12/2020  [facility] 01/24/2021 10/24/2020 06/02/2020  Decreased Interest 0 0 0  Down, Depressed, Hopeless 0 0 0  PHQ - 2 Score 0 0 0  Altered sleeping 0 0 -  Tired, decreased energy 0 0 -  Change in appetite 0 0 -  Feeling bad or failure about yourself  0 0 -  Trouble concentrating 0 0 -  Moving slowly or fidgety/restless 0 0 -  Suicidal thoughts 0 0 -  PHQ-9 Score 0 0 -  Difficult doing work/chores Not difficult at all Not difficult at all -    Past Medical History:  Diagnosis Date   Diabetes mellitus without complication ([facility]   Hyperlipidemia    Hypertension    Neuropathy    [facility]   Past Surgical History:  Procedure Laterality Date   CATARACT EXTRACTION W/PHACO Left 04/24/2015   Procedure: CATARACT EXTRACTION PHACO AND INTRAOCULAR LENS PLACEMENT ([place]  Surgeon: StEstill CottaMD;  Location: [facility] ORS;  Service: Ophthalmology;  Laterality: Left;  [place]:12 AP% 25.7 CDE  28.30 fluid pack lot #[id number]   COLONOSCOPY     Social History   Socioeconomic History   Marital status: Widowed    Spouse name: Not on file   Number of children: Not on file   Years of education: Not on file   Highest education level: High school graduate  Occupational History   Not on file  Tobacco Use   Smoking status: Former    Types: Cigarettes    Quit date: 05/27/1978    Years since quitting: 42.6   Smokeless tobacco: Never  Vaping Use   Vaping Use: Never used  Substance and Sexual Activity   Alcohol use: No    Alcohol/week: 0.0 standard drinks   Drug use: No   Sexual activity: Not on file  Other Topics Concern   Not on file  Social History Narrative   Lives in retirement community    Social Determinants of Health   Financial Resource Strain: Not on file  Food Insecurity: Not on file  Transportation Needs: Not on file  Physical Activity: Not on file  Stress: Not on file  Social Connections: Not on file  Intimate Partner Violence: Not on file   Family History  Problem Relation Age of Onset   Stroke Mother    Cancer Mother        colon cancer   COPD Father    COPD Brother    Hearing loss Son  Current Outpatient Medications on File Prior to Visit  Medication Sig   Alpha Lipoic Acid 200 MG CAPS Take 200 mg by mouth 3 (three) times daily.   colchicine 0.6 MG tablet If acute flare only - Day 1: take 2 pills first day, then repeat 1 pill within two hours. Day 2 - take 1 pill daily for up to max 7 days or stop when gout flare resolves.   glucosamine-chondroitin 500-400 MG tablet Take 1 tablet by mouth 3 (three) times daily as needed.    ipratropium (ATROVENT) 0.06 % nasal spray Place 2 sprays into both nostrils 4 (four) times daily.   VOLTAREN 1 % GEL 4 GRAMS, Transdermal, four times daily, as needed   No current facility-administered medications on file prior to visit.    Review of Systems  Constitutional:  Negative for activity change, appetite change,  chills, diaphoresis, fatigue and fever.  HENT:  Negative for congestion and hearing loss.   Eyes:  Negative for visual disturbance.  Respiratory:  Negative for cough, chest tightness, shortness of breath and wheezing.   Cardiovascular:  Negative for chest pain, palpitations and leg swelling.  Gastrointestinal:  Negative for abdominal pain, constipation, diarrhea, nausea and vomiting.  Genitourinary:  Negative for dysuria, frequency and hematuria.  Musculoskeletal:  Negative for arthralgias and neck pain.  Skin:  Negative for rash.  Allergic/Immunologic: Negative for environmental allergies.  Neurological:  Negative for dizziness, weakness, light-headedness, numbness and headaches.  Hematological:  Negative for adenopathy.  Psychiatric/Behavioral:  Negative for behavioral problems, dysphoric mood and sleep disturbance.   Per HPI unless specifically indicated above      Objective:    BP (!) 95/59   Pulse 86   Ht '5\' 10"'  (1.778 m)   Wt 147 lb 3.2 oz (66.8 kg)   SpO2 97%   BMI 21.12 kg/m   Wt Readings from Last 3 Encounters:  01/24/21 147 lb 3.2 oz (66.8 kg)  10/24/20 150 lb 6.4 oz (68.2 kg)  07/19/20 154 lb 6.4 oz (70 kg)    Physical Exam Vitals and nursing note reviewed.  Constitutional:      General: He is not in acute distress.    Appearance: He is well-developed. He is not diaphoretic.     Comments: Well-appearing, comfortable, cooperative  HENT:     Head: Normocephalic and atraumatic.  Eyes:     General:        Right eye: No discharge.        Left eye: No discharge.     Conjunctiva/sclera: Conjunctivae normal.     Pupils: Pupils are equal, round, and reactive to light.  Neck:     Thyroid: No thyromegaly.  Cardiovascular:     Rate and Rhythm: Normal rate and regular rhythm.     Pulses: Normal pulses.     Heart sounds: Normal heart sounds. No murmur heard. Pulmonary:     Effort: Pulmonary effort is normal. No respiratory distress.     Breath sounds: Normal breath  sounds. No wheezing or rales.  Abdominal:     General: Bowel sounds are normal. There is no distension.     Palpations: Abdomen is soft. There is no mass.     Tenderness: There is no abdominal tenderness.  Musculoskeletal:        General: No tenderness. Normal range of motion.     Cervical back: Normal range of motion and neck supple.     Right lower leg: No edema.     Left lower  leg: No edema.     Comments: Upper / Lower Extremities: - Normal muscle tone, strength bilateral upper extremities 5/5, lower extremities 5/5  Lymphadenopathy:     Cervical: No cervical adenopathy.  Skin:    General: Skin is warm and dry.     Findings: No erythema or rash.  Neurological:     Mental Status: He is alert and oriented to person, place, and time.     Comments: Distal sensation intact to light touch all extremities  Psychiatric:        Mood and Affect: Mood normal.        Behavior: Behavior normal.        Thought Content: Thought content normal.     Comments: Well groomed, good eye contact, normal speech and thoughts    Diabetic Foot Exam - Simple   Simple Foot Form Diabetic Foot exam was performed with the following findings: Yes 01/24/2021 11:19 AM  Visual Inspection No deformities, no ulcerations, no other skin breakdown bilaterally: See Comments. Sensation Testing See comments: Yes Pulse Check Posterior Tibialis and Dorsalis pulse intact bilaterally: Yes Comments Reduced sensation to monofilament bilateral. Forefoot and heel callus formation without ulceration.      Results for orders placed or performed in visit on 01/09/21  TSH  Result Value Ref Range   TSH 2.21 0.40 - 4.50 mIU/L  PSA  Result Value Ref Range   PSA 0.07 < OR = 4.00 ng/mL  Lipid panel  Result Value Ref Range   Cholesterol 206 (H) <200 mg/dL   HDL 36 (L) > OR = 40 mg/dL   Triglycerides 245 (H) <150 mg/dL   LDL Cholesterol (Calc) 130 (H) mg/dL (calc)   Total CHOL/HDL Ratio 5.7 (H) <5.0 (calc)   Non-HDL  Cholesterol (Calc) 170 (H) <130 mg/dL (calc)  COMPLETE METABOLIC PANEL WITH GFR  Result Value Ref Range   Glucose, Bld 108 65 - 139 mg/dL   BUN 27 (H) 7 - 25 mg/dL   Creat 1.17 0.70 - 1.22 mg/dL   eGFR 61 > OR = 60 mL/min/1.70m   BUN/Creatinine Ratio 23 (H) 6 - 22 (calc)   Sodium 137 135 - 146 mmol/L   Potassium 4.8 3.5 - 5.3 mmol/L   Chloride 99 98 - 110 mmol/L   CO2 31 20 - 32 mmol/L   Calcium 9.7 8.6 - 10.3 mg/dL   Total Protein 6.9 6.1 - 8.1 g/dL   Albumin 4.2 3.6 - 5.1 g/dL   Globulin 2.7 1.9 - 3.7 g/dL (calc)   AG Ratio 1.6 1.0 - 2.5 (calc)   Total Bilirubin 0.5 0.2 - 1.2 mg/dL   Alkaline phosphatase (APISO) 53 35 - 144 U/L   AST 12 10 - 35 U/L   ALT 5 (L) 9 - 46 U/L  CBC with Differential/Platelet  Result Value Ref Range   WBC 7.8 3.8 - 10.8 Thousand/uL   RBC 5.34 4.20 - 5.80 Million/uL   Hemoglobin 15.8 13.2 - 17.1 g/dL   HCT 47.5 38.5 - 50.0 %   MCV 89.0 80.0 - 100.0 fL   MCH 29.6 27.0 - 33.0 pg   MCHC 33.3 32.0 - 36.0 g/dL   RDW 13.1 11.0 - 15.0 %   Platelets 156 140 - 400 Thousand/uL   MPV 10.2 7.5 - 12.5 fL   Neutro Abs 3,650 1,500 - 7,800 cells/uL   Lymphs Abs 3,167 850 - 3,900 cells/uL   Absolute Monocytes 741 200 - 950 cells/uL   Eosinophils Absolute 172 15 -  500 cells/uL   Basophils Absolute 70 0 - 200 cells/uL   Neutrophils Relative % 46.8 %   Total Lymphocyte 40.6 %   Monocytes Relative 9.5 %   Eosinophils Relative 2.2 %   Basophils Relative 0.9 %  Hemoglobin A1c  Result Value Ref Range   Hgb A1c MFr Bld 6.0 (H) <5.7 % of total Hgb   Mean Plasma Glucose 126 mg/dL   eAG (mmol/L) 7.0 mmol/L      Assessment & Plan:   Problem List Items Addressed This Visit     Type 2 diabetes mellitus with diabetic polyneuropathy (HCC)    improved A1c to 6.0 on lifestyle overhaul Complications - CKD-III, peripheral neuropathy, other including hyperlipidemia specifically hypertriglyceridemia - increases risk of future cardiovascular complications    Plan:   1. Discontinue Metformin XR 565m 2. Encourage improved lifestyle - low carb, low sugar diet, reduce portion size, continue improving regular exercise 3. Continue ACEi 4. Continue Gabapentin - reduce dosage as advised 5. F/u with Dr WPhoebe Sharps     Relevant Medications   gabapentin (NEURONTIN) 600 MG tablet   lisinopril (ZESTRIL) 5 MG tablet   Primary osteoarthritis involving multiple joints   Personal history of prostate cancer   Hyperlipidemia associated with type 2 diabetes mellitus (HCC)   Relevant Medications   ezetimibe (ZETIA) 10 MG tablet   lisinopril (ZESTRIL) 5 MG tablet   Drug-induced myopathy   Benign hypertension with CKD (chronic kidney disease) stage III (HCC)   Relevant Medications   ezetimibe (ZETIA) 10 MG tablet   lisinopril (ZESTRIL) 5 MG tablet   Allergic rhinitis   Relevant Medications   fluticasone (FLONASE) 50 MCG/ACT nasal spray   Other Visit Diagnoses     Annual physical exam    -  Primary   Shoulder impingement, right       Bilateral hearing loss, unspecified hearing loss type       Relevant Orders   Ambulatory referral to Audiology       Updated Health Maintenance information Reviewed recent lab results with patient Encouraged improvement to lifestyle with diet and exercise Goal maintain healthy weight.  [facility]  Rhinitis Hold Atrovent now with more sinus congestion can switch to Flonase.  HLD Drug Induced Myopathy Remain off Statin and continue Zetia. With good results.  HTN / CKD III Stable on last lab with [facility] Continue Lisinopril 577mdaily for renal protection  T2DM See above  R Shoulder Chronic pain rotator cuff Return to [place] repeat series injection as indicated.   ------  Chronic bilateral diabetic neuropathy in both feet, as complication of diabetes Evidence of callus formation both feet, heels  Completed DM Foot Exam today in office, 01/24/21. See exam  note.  Plan - Proceed with ordering Diabetic Shoes, when ready, when receive form will complete and fax - Patient would benefit from Diabetic Shoes due to neuropathy with callus formation, diabetes control is improving on current regimen, and I am continuing to monitor and manage diabetes.   Meds ordered this encounter  Medications   fluticasone (FLONASE) 50 MCG/ACT nasal spray    Sig: Place 2 sprays into both nostrils daily. Use for 4-6 weeks then stop and use seasonally or as needed.    Dispense:  16 g    Refill:  3   ezetimibe (ZETIA) 10 MG tablet    Sig: Take 1 tablet (10 mg total) by mouth daily.    Dispense:  90  tablet    Refill:  3   gabapentin (NEURONTIN) 600 MG tablet    Sig: Take 1 tablet (600 mg total) by mouth 3 (three) times daily.    Dispense:  270 tablet    Refill:  3    Dose adjustment, down to 1 pill 3 times a day. He may not be ready for fill.   lisinopril (ZESTRIL) 5 MG tablet    Sig: Take 1 tablet (5 mg total) by mouth daily.    Dispense:  90 tablet    Refill:  3     Follow up plan: Return in about 6 months (around 07/24/2021) for 6 month DM A1c, HTN.  Nobie Putnam, [facility] 01/24/2021, 11:06 AM

## 2021-01-24 NOTE — Assessment & Plan Note (Signed)
improved A1c to 6.0 on lifestyle overhaul Complications - CKD-III, peripheral neuropathy, other including hyperlipidemia specifically hypertriglyceridemia - increases risk of future cardiovascular complications    Plan:  1. Discontinue Metformin XR '500mg'$  2. Encourage improved lifestyle - low carb, low sugar diet, reduce portion size, continue improving regular exercise 3. Continue ACEi 4. Continue Gabapentin - reduce dosage as advised 5. F/u with Dr Phoebe Sharps

## 2021-01-24 NOTE — Patient Instructions (Addendum)
Thank you for coming to the office today.  Hold Atrovent nasal (for drip)  For congestion - Start nasal steroid Flonase 2 sprays in each nostril daily for 4-6 weeks, may repeat course seasonally or as needed   Last shot for Shoulder 11/06/20   Regino Bellow, PA   [address] [place], [place] [address]   [phone] (Work)    Call them to schedule if need any other treatment or shot after 02/06/21  Audiology will call you for hearing test.  Please schedule a Follow-up Appointment to: Return in about 6 months (around 07/24/2021) for 6 month DM A1c, HTN.  If you have any other questions or concerns, please feel free to call the office or send a message through [place]. You may also schedule an earlier appointment if necessary.  Additionally, you may be receiving a survey about your experience at our office within a few days to 1 week by e-mail or mail. We value your feedback.  Nobie Putnam, DO [place]

## 2021-02-27 DIAGNOSIS — H90A21 Sensorineural hearing loss, unilateral, right ear, with restricted hearing on the contralateral side: Secondary | ICD-10-CM | POA: Diagnosis not present

## 2021-02-27 DIAGNOSIS — J3489 Other specified disorders of nose and nasal sinuses: Secondary | ICD-10-CM | POA: Diagnosis not present

## 2021-02-27 DIAGNOSIS — H903 Sensorineural hearing loss, bilateral: Secondary | ICD-10-CM | POA: Diagnosis not present

## 2021-02-27 DIAGNOSIS — R0982 Postnasal drip: Secondary | ICD-10-CM | POA: Diagnosis not present

## 2021-02-27 DIAGNOSIS — H6123 Impacted cerumen, bilateral: Secondary | ICD-10-CM | POA: Diagnosis not present

## 2021-03-06 DIAGNOSIS — H5203 Hypermetropia, bilateral: Secondary | ICD-10-CM | POA: Diagnosis not present

## 2021-03-06 DIAGNOSIS — E119 Type 2 diabetes mellitus without complications: Secondary | ICD-10-CM | POA: Diagnosis not present

## 2021-03-06 DIAGNOSIS — H2511 Age-related nuclear cataract, right eye: Secondary | ICD-10-CM | POA: Diagnosis not present

## 2021-03-06 DIAGNOSIS — H35373 Puckering of macula, bilateral: Secondary | ICD-10-CM | POA: Diagnosis not present

## 2021-03-07 ENCOUNTER — Telehealth: Payer: Self-pay | Admitting: Family Medicine

## 2021-03-08 NOTE — Telephone Encounter (Signed)
Pt wanted an update regarding his Diabetic shoes, pt is having a hard time walking, please advise.

## 2021-03-09 NOTE — Telephone Encounter (Signed)
I contacted the patient and he informed me that he has not reached out to anyone at this time. He would like to go with [facility]  Fax # [phone].

## 2021-03-09 NOTE — Telephone Encounter (Signed)
Handwritten DM Shoe order ready to be faxed to [facility]  They should send [place] back an order form and we can complete that and send office note.  Nobie Putnam, DO [facility] 03/09/2021, 5:13 PM

## 2021-03-09 NOTE — Telephone Encounter (Signed)
I reviewed the last documentation from 01/24/21 visit with me. I do not see any order request for Diabetic Shoes, and I have not received any paperwork / orders from any Diabetic Shoe company.  Can he please clarify his request? Has he already been to a [employer] or place that will order them? We will need the order form first.  Or if he has not been to any location, he will need to notify us where to fax a handwritten order so that they can get [place] an order form.  Thanks - let me know!  --------   These are the main two locations we usually use.  [employer] [place], [place] [address] Open until 5PM Phone: [phone]  [facility] [place] [address] [place], [place] [address] Ph [phone] Fax [phone]   Nobie Putnam, [facility] 03/09/2021, 8:41 AM

## 2021-03-20 ENCOUNTER — Telehealth: Payer: Self-pay

## 2021-03-20 ENCOUNTER — Ambulatory Visit: Payer: PPO

## 2021-03-20 NOTE — Telephone Encounter (Signed)
This nurse called patient in regards to our scheduled telephonic AWV. Patient stated that it was not a good time that he was waiting for another call. I let him know that we will call him again to reschedule for another time.

## 2021-04-05 DIAGNOSIS — E1142 Type 2 diabetes mellitus with diabetic polyneuropathy: Secondary | ICD-10-CM | POA: Diagnosis not present

## 2021-04-17 ENCOUNTER — Ambulatory Visit (INDEPENDENT_AMBULATORY_CARE_PROVIDER_SITE_OTHER): Payer: PPO | Admitting: Family Medicine

## 2021-04-17 ENCOUNTER — Encounter: Payer: Self-pay | Admitting: Family Medicine

## 2021-04-17 VITALS — Ht 70.0 in | Wt 147.0 lb

## 2021-04-17 DIAGNOSIS — J0110 Acute frontal sinusitis, unspecified: Secondary | ICD-10-CM

## 2021-04-17 MED ORDER — AMOXICILLIN-POT CLAVULANATE 875-125 MG PO TABS: 1.0000 | ORAL_TABLET | Freq: Two times a day (BID) | ORAL | 0 refills | Status: DC

## 2021-04-17 NOTE — Progress Notes (Signed)
Virtual Visit via Telephone The purpose of this virtual visit is to provide medical care while limiting exposure to the novel coronavirus (COVID19) for both patient and office staff.  Consent was obtained for phone visit:  Yes.   Answered questions that patient had about telehealth interaction:  Yes.   I discussed the limitations, risks, security and privacy concerns of performing an evaluation and management service by telephone. I also discussed with the patient that there may be a patient responsible charge related to this service. The patient expressed understanding and agreed to proceed.  Patient Location: Home Provider Location: [facility] (Office)  Participants in virtual visit: - Patient: [name]. [place]: Orinda Kenner, CMA - Provider: Dr Parks Ranger  ---------------------------------------------------------------------- Chief Complaint  Patient presents with   Cough   Nasal Congestion    S: Reviewed CMA documentation. I have called patient and gathered additional HPI as follows:  Sinusitis Reports that symptoms started >1 week ago with sinus symptoms. Describes head sinus congestion with drainage, and causing cough. He has some runny nose. Tried nose spray rx Fluticasone - Tried OTC cough medicine without relief  Denies any known or suspected exposure to person with or possibly with COVID19.  Denies any fevers, chills, sweats, body ache, shortness of breath, abdominal pain, diarrhea  Past Medical History:  Diagnosis Date   Diabetes mellitus without complication ([place])    Hyperlipidemia    Hypertension    Neuropathy    Prostate cancer ([place])    Social History   Tobacco Use   Smoking status: Former    Types: Cigarettes    Quit date: 05/27/1978    Years since quitting: 42.9   Smokeless tobacco: Never  Vaping Use   Vaping Use: Never used  Substance Use Topics   Alcohol use: No    Alcohol/week: 0.0 standard drinks   Drug use: No     Current Outpatient Medications:    Alpha Lipoic Acid 200 MG CAPS, Take 200 mg by mouth 3 (three) times daily., Disp: 90 capsule, Rfl: 0   amoxicillin-clavulanate (AUGMENTIN) 875-125 MG tablet, Take 1 tablet by mouth 2 (two) times daily., Disp: 20 tablet, Rfl: 0   colchicine 0.6 MG tablet, If acute flare only - Day 1: take 2 pills first day, then repeat 1 pill within two hours. Day 2 - take 1 pill daily for up to max 7 days or stop when gout flare resolves., Disp: 20 tablet, Rfl: 2   ezetimibe (ZETIA) 10 MG tablet, Take 1 tablet (10 mg total) by mouth daily., Disp: 90 tablet, Rfl: 3   fluticasone (FLONASE) 50 MCG/ACT nasal spray, Place 2 sprays into both nostrils daily. Use for 4-6 weeks then stop and use seasonally or as needed., Disp: 16 g, Rfl: 3   gabapentin (NEURONTIN) 600 MG tablet, Take 1 tablet (600 mg total) by mouth 3 (three) times daily., Disp: 270 tablet, Rfl: 3   glucosamine-chondroitin 500-400 MG tablet, Take 1 tablet by mouth 3 (three) times daily as needed. , Disp: , Rfl:    lisinopril (ZESTRIL) 5 MG tablet, Take 1 tablet (5 mg total) by mouth daily., Disp: 90 tablet, Rfl: 3   VOLTAREN 1 % GEL, 4 GRAMS, Transdermal, four times daily, as needed, Disp: 100 g, Rfl: 3  Depression screen [facility] 2/9 01/24/2021 10/24/2020 06/02/2020  Decreased Interest 0 0 0  Down, Depressed, Hopeless 0 0 0  PHQ - 2 Score 0 0 0  Altered sleeping 0 0 -  Tired,  decreased energy 0 0 -  Change in appetite 0 0 -  Feeling bad or failure about yourself  0 0 -  Trouble concentrating 0 0 -  Moving slowly or fidgety/restless 0 0 -  Suicidal thoughts 0 0 -  PHQ-9 Score 0 0 -  Difficult doing work/chores Not difficult at all Not difficult at all -    GAD 7 : Generalized Anxiety Score 01/24/2021 10/24/2020  Nervous, Anxious, on Edge 0 0  Control/stop worrying 0 0  Worry too much - different things 0 0  Trouble relaxing 0 0  Restless 0 0  Easily annoyed or irritable 0 0  Afraid - awful might happen 0 0   Total GAD 7 Score 0 0  Anxiety Difficulty Not difficult at all Not difficult at all    -------------------------------------------------------------------------- O: No physical exam performed due to remote telephone encounter.  Lab results reviewed.  No results found for this or any previous visit (from the past 2160 hour(s)).  -------------------------------------------------------------------------- A&P:  Problem List Items Addressed This Visit   None Visit Diagnoses     Acute non-recurrent frontal sinusitis    -  Primary   Relevant Medications   amoxicillin-clavulanate (AUGMENTIN) 875-125 MG tablet      Consistent with acute frontal sinusitis, likely initially viral URI vs allergic rhinitis component with worsening concern for bacterial infection.   Plan: 1. Start Augmentin 875-125mg  PO BID x 10 days 2. Continue nasal steroid Flonase 2 sprays in each nostril daily for 4-6 weeks, may repeat course seasonally or as needed 3. Supportive care with nasal saline OTC, hydration 4. Return criteria reviewed   Meds ordered this encounter  Medications   amoxicillin-clavulanate (AUGMENTIN) 875-125 MG tablet    Sig: Take 1 tablet by mouth 2 (two) times daily.    Dispense:  20 tablet    Refill:  0    Follow-up: - Return in 1 week - if not improved  Patient verbalizes understanding with the above medical recommendations including the limitation of remote medical advice.  Specific follow-up and call-back criteria were given for patient to follow-up or seek medical care more urgently if needed.   - Time spent in direct consultation with patient on phone: 6 minutes  Nobie Putnam, [facility] 04/17/2021, 3:33 PM

## 2021-04-17 NOTE — Patient Instructions (Signed)
° °  Please schedule a Follow-up Appointment to: No follow-ups on file. ° °If you have any other questions or concerns, please feel free to call the office or send a message through MyChart. You may also schedule an earlier appointment if necessary. ° °Additionally, you may be receiving a survey about your experience at our office within a few days to 1 week by e-mail or mail. We value your feedback. ° °Lucrezia Dehne, DO °[facility] °

## 2021-05-29 ENCOUNTER — Other Ambulatory Visit: Payer: Self-pay

## 2021-05-29 ENCOUNTER — Ambulatory Visit (INDEPENDENT_AMBULATORY_CARE_PROVIDER_SITE_OTHER): Payer: Medicare HMO | Admitting: Family Medicine

## 2021-05-29 ENCOUNTER — Encounter: Payer: Self-pay | Admitting: Family Medicine

## 2021-05-29 VITALS — Ht 70.0 in | Wt 147.0 lb

## 2021-05-29 DIAGNOSIS — J0110 Acute frontal sinusitis, unspecified: Secondary | ICD-10-CM

## 2021-05-29 MED ORDER — LEVOFLOXACIN 500 MG PO TABS: 500.0000 mg | ORAL_TABLET | Freq: Every day | ORAL | 0 refills | Status: DC

## 2021-05-29 NOTE — Patient Instructions (Addendum)
° °  Please schedule a Follow-up Appointment to: Return in about 1 week (around 06/05/2021), or if symptoms worsen or fail to improve.  If you have any other questions or concerns, please feel free to call the office or send a message through [place]. You may also schedule an earlier appointment if necessary.  Additionally, you may be receiving a survey about your experience at our office within a few days to 1 week by e-mail or mail. We value your feedback.  Nobie Putnam, DO [place]

## 2021-05-29 NOTE — Progress Notes (Signed)
Virtual Visit via Telephone The purpose of this virtual visit is to provide medical care while limiting exposure to the novel coronavirus (COVID19) for both patient and office staff.  Consent was obtained for phone visit:  Yes.   Answered questions that patient had about telehealth interaction:  Yes.   I discussed the limitations, risks, security and privacy concerns of performing an evaluation and management service by telephone. I also discussed with the patient that there may be a patient responsible charge related to this service. The patient expressed understanding and agreed to proceed.  Patient Location: Home Provider Location: [facility] (Office)  Participants in virtual visit: - Patient: [name] - CMA: Orinda Kenner, [place] - Provider: Dr Parks Ranger  ---------------------------------------------------------------------- Chief Complaint  Patient presents with   Cough   chest congestion    S: Reviewed CMA documentation. I have called patient and gathered additional HPI as follows:  Sinusitis Update last visit 04/17/21, treated for Sinusitis with Augmentin and it resolved. He was on Flonase regularly. And OTC meds  Now update with new return of symptoms past 1-2 weeks around Christmas developed deeper chest congestion and productive cough. Admits assoc sinus congestion He has had history of pneumonia request treatment to avoid this  Denies any high risk travel to areas of current concern for COVID19. Denies any known or suspected exposure to person with or possibly with COVID19.  Denies any fevers, chills, sweats, body ache, shortness of breath, sinus pain or pressure, headache, abdominal pain, diarrhea  Past Medical History:  Diagnosis Date   Diabetes mellitus without complication ([place])    Hyperlipidemia    Hypertension    Neuropathy    Prostate cancer ([place])    Social History   Tobacco Use   Smoking status: Former    Types: Cigarettes     Quit date: 05/27/1978    Years since quitting: 43.0   Smokeless tobacco: Never  Vaping Use   Vaping Use: Never used  Substance Use Topics   Alcohol use: No    Alcohol/week: 0.0 standard drinks   Drug use: No    Current Outpatient Medications:    Alpha Lipoic Acid 200 MG CAPS, Take 200 mg by mouth 3 (three) times daily., Disp: 90 capsule, Rfl: 0   colchicine 0.6 MG tablet, If acute flare only - Day 1: take 2 pills first day, then repeat 1 pill within two hours. Day 2 - take 1 pill daily for up to max 7 days or stop when gout flare resolves., Disp: 20 tablet, Rfl: 2   ezetimibe (ZETIA) 10 MG tablet, Take 1 tablet (10 mg total) by mouth daily., Disp: 90 tablet, Rfl: 3   fluticasone (FLONASE) 50 MCG/ACT nasal spray, Place 2 sprays into both nostrils daily. Use for 4-6 weeks then stop and use seasonally or as needed., Disp: 16 g, Rfl: 3   gabapentin (NEURONTIN) 600 MG tablet, Take 1 tablet (600 mg total) by mouth 3 (three) times daily., Disp: 270 tablet, Rfl: 3   glucosamine-chondroitin 500-400 MG tablet, Take 1 tablet by mouth 3 (three) times daily as needed. , Disp: , Rfl:    levofloxacin (LEVAQUIN) 500 MG tablet, Take 1 tablet (500 mg total) by mouth daily. For 7 days, Disp: 7 tablet, Rfl: 0   lisinopril (ZESTRIL) 5 MG tablet, Take 1 tablet (5 mg total) by mouth daily., Disp: 90 tablet, Rfl: 3   VOLTAREN 1 % GEL, 4 GRAMS, Transdermal, four times daily, as needed, Disp: 100 g,  Rfl: 3  Depression screen [facility] 2/9 01/24/2021 10/24/2020 06/02/2020  Decreased Interest 0 0 0  Down, Depressed, Hopeless 0 0 0  PHQ - 2 Score 0 0 0  Altered sleeping 0 0 -  Tired, decreased energy 0 0 -  Change in appetite 0 0 -  Feeling bad or failure about yourself  0 0 -  Trouble concentrating 0 0 -  Moving slowly or fidgety/restless 0 0 -  Suicidal thoughts 0 0 -  PHQ-9 Score 0 0 -  Difficult doing work/chores Not difficult at all Not difficult at all -    GAD 7 : Generalized Anxiety Score 01/24/2021 10/24/2020   Nervous, Anxious, on Edge 0 0  Control/stop worrying 0 0  Worry too much - different things 0 0  Trouble relaxing 0 0  Restless 0 0  Easily annoyed or irritable 0 0  Afraid - awful might happen 0 0  Total GAD 7 Score 0 0  Anxiety Difficulty Not difficult at all Not difficult at all    -------------------------------------------------------------------------- O: No physical exam performed due to remote telephone encounter.  Lab results reviewed.  No results found for this or any previous visit (from the past 2160 hour(s)).  -------------------------------------------------------------------------- A&P:  Problem List Items Addressed This Visit   None Visit Diagnoses     Acute non-recurrent frontal sinusitis    -  Primary   Relevant Medications   levofloxacin (LEVAQUIN) 500 MG tablet      Consistent with recurrent vs acute frontal sinusitis, likely initially viral URI vs allergic rhinitis component with worsening concern for bacterial infection. Now with progression to lower respiratory symptoms chest congestion worsening   Plan: 1. Start taking Levaquin antibiotic 500mg  daily x 7 days 2. Continue nasal steroid Flonase 2 sprays in each nostril daily for 4-6 weeks, may repeat course seasonally or as needed 3. Supportive care with nasal saline OTC, hydration 4. Return criteria reviewed  Meds ordered this encounter  Medications   levofloxacin (LEVAQUIN) 500 MG tablet    Sig: Take 1 tablet (500 mg total) by mouth daily. For 7 days    Dispense:  7 tablet    Refill:  0    Follow-up: - Return in 1 week PRN if not improved  Patient verbalizes understanding with the above medical recommendations including the limitation of remote medical advice.  Specific follow-up and call-back criteria were given for patient to follow-up or seek medical care more urgently if needed.   - Time spent in direct consultation with patient on phone: 7 minutes  Nobie Putnam,  [facility] 05/29/2021, 3:49 PM

## 2021-06-15 DIAGNOSIS — E1142 Type 2 diabetes mellitus with diabetic polyneuropathy: Secondary | ICD-10-CM | POA: Diagnosis not present

## 2021-08-24 ENCOUNTER — Other Ambulatory Visit: Payer: Self-pay | Admitting: Family Medicine

## 2021-08-24 DIAGNOSIS — E1142 Type 2 diabetes mellitus with diabetic polyneuropathy: Secondary | ICD-10-CM

## 2021-08-24 NOTE — Telephone Encounter (Signed)
Requested medication (s) are due for refill today - no ? ?Requested medication (s) are on the active medication list -no ? ?Future visit scheduled -yes ? ?Last refill: unknown ? ?Notes to clinic: Request RF: medication no longer on current medication list ? ?Requested Prescriptions  ?Pending Prescriptions Disp Refills  ? metFORMIN (GLUCOPHAGE-XR) 500 MG 24 hr tablet [Pharmacy Med Name: METFORMIN HCL ER 500 MG TABLET] 90 tablet 0  ?  Sig: Take 1 tablet (500 mg total) by mouth daily with breakfast.  ?  ? Endocrinology:  Diabetes - Biguanides Failed - 08/24/2021 10:03 AM  ?  ?  Failed - HBA1C is between 0 and 7.9 and within 180 days  ?  Hgb A1c MFr Bld  ?Date Value Ref Range Status  ?01/10/2021 6.0 (H) <5.7 % of total Hgb Final  ?  Comment:  ?  For someone without known diabetes, a hemoglobin  ?A1c value between 5.7% and 6.4% is consistent with ?prediabetes and should be confirmed with a  ?follow-up test. ?. ?For someone with known diabetes, a value <7% ?indicates that their diabetes is well controlled. A1c ?targets should be individualized based on duration of ?diabetes, age, comorbid conditions, and other ?considerations. ?. ?This assay result is consistent with an increased risk ?of diabetes. ?. ?Currently, no consensus exists regarding use of ?hemoglobin A1c for diagnosis of diabetes for children. ?. ?  ?  ?  ?  ?  Failed - B12 Level in normal range and within 720 days  ?  No results found for: VITAMINB12  ?  ?  ?  Failed - Valid encounter within last 6 months  ?  Recent Outpatient Visits   ? ?      ? 2 months ago Acute non-recurrent frontal sinusitis  ? [place], DO  ? 4 months ago Acute non-recurrent frontal sinusitis  ? [place], DO  ? 7 months ago Annual physical exam  ? [place], DO  ? 10 months ago Chronic pain of right upper extremity  ? [place], DO  ? 1 year ago Type 2 diabetes mellitus with diabetic polyneuropathy, without long-term current use of insulin ([place])  ? [place], DO  ? ?  ?  ? ?  ?  ?  Passed - Cr in normal range and within 360 days  ?  Creat  ?Date Value Ref Range Status  ?01/10/2021 1.17 0.70 - 1.22 mg/dL Final  ?  ?  ?  ?  Passed - eGFR in normal range and within 360 days  ?  GFR, Est African American  ?Date Value Ref Range Status  ?12/31/2019 66 > OR = 60 mL/min/1.53m Final  ? ?GFR, Est Non African American  ?Date Value Ref Range Status  ?12/31/2019 57 (L) > OR = 60 mL/min/1.746mFinal  ? ?eGFR  ?Date Value Ref Range Status  ?01/10/2021 61 > OR = 60 mL/min/1.7390minal  ?  Comment:  ?  The eGFR is based on the CKD-EPI 2021 equation. To calculate  ?the new eGFR from a previous Creatinine or Cystatin C ?result, go to https://www.kidney.org/professionals/ ?kdoqi/gfr%5Fcalculator ?  ?  ?  ?  ?  Passed - CBC within normal limits and completed in the last 12 months  ?  WBC  ?Date Value Ref Range Status  ?01/10/2021 7.8 3.8 - 10.8 Thousand/uL Final  ? ?  RBC  ?Date Value Ref Range Status  ?01/10/2021 5.34 4.20 - 5.80 Million/uL Final  ? ?Hemoglobin  ?Date Value Ref Range Status  ?01/10/2021 15.8 13.2 - 17.1 g/dL Final  ?09/04/2015 16.8 12.6 - 17.7 g/dL Final  ? ?HCT  ?Date Value Ref Range Status  ?01/10/2021 47.5 38.5 - 50.0 % Final  ? ?Hematocrit  ?Date Value Ref Range Status  ?09/04/2015 47.6 37.5 - 51.0 % Final  ? ?MCHC  ?Date Value Ref Range Status  ?01/10/2021 33.3 32.0 - 36.0 g/dL Final  ? ?MCH  ?Date Value Ref Range Status  ?01/10/2021 29.6 27.0 - 33.0 pg Final  ? ?MCV  ?Date Value Ref Range Status  ?01/10/2021 89.0 80.0 - 100.0 fL Final  ?09/04/2015 85 79 - 97 fL Final  ? ?No results found for: PLTCOUNTKUC, LABPLAT, [place] ?RDW  ?Date Value Ref Range Status  ?01/10/2021 13.1 11.0 - 15.0 % Final  ?09/04/2015 13.6 12.3 - 15.4 % Final  ? ?  ?  ?  ? ? ? ?Requested Prescriptions  ?Pending  Prescriptions Disp Refills  ? metFORMIN (GLUCOPHAGE-XR) 500 MG 24 hr tablet [Pharmacy Med Name: METFORMIN HCL ER 500 MG TABLET] 90 tablet 0  ?  Sig: Take 1 tablet (500 mg total) by mouth daily with breakfast.  ?  ? Endocrinology:  Diabetes - Biguanides Failed - 08/24/2021 10:03 AM  ?  ?  Failed - HBA1C is between 0 and 7.9 and within 180 days  ?  Hgb A1c MFr Bld  ?Date Value Ref Range Status  ?01/10/2021 6.0 (H) <5.7 % of total Hgb Final  ?  Comment:  ?  For someone without known diabetes, a hemoglobin  ?A1c value between 5.7% and 6.4% is consistent with ?prediabetes and should be confirmed with a  ?follow-up test. ?. ?For someone with known diabetes, a value <7% ?indicates that their diabetes is well controlled. A1c ?targets should be individualized based on duration of ?diabetes, age, comorbid conditions, and other ?considerations. ?. ?This assay result is consistent with an increased risk ?of diabetes. ?. ?Currently, no consensus exists regarding use of ?hemoglobin A1c for diagnosis of diabetes for children. ?. ?  ?  ?  ?  ?  Failed - B12 Level in normal range and within 720 days  ?  No results found for: VITAMINB12  ?  ?  ?  Failed - Valid encounter within last 6 months  ?  Recent Outpatient Visits   ? ?      ? 2 months ago Acute non-recurrent frontal sinusitis  ? [place], DO  ? 4 months ago Acute non-recurrent frontal sinusitis  ? [place], DO  ? 7 months ago Annual physical exam  ? [place], DO  ? 10 months ago Chronic pain of right upper extremity  ? [place], DO  ? 1 year ago Type 2 diabetes mellitus with diabetic polyneuropathy, without long-term current use of insulin ([place])  ? [place], DO  ? ?  ?  ? ?  ?  ?  Passed - Cr in normal range and within 360 days  ?  Creat  ?Date Value Ref Range  Status  ?01/10/2021 1.17 0.70 - 1.22 mg/dL Final  ?  ?  ?  ?  Passed - eGFR in normal range and within 360 days  ?  GFR, Est African  American  ?Date Value Ref Range Status  ?12/31/2019 66 > OR = 60 mL/min/1.10m Final  ? ?GFR, Est Non African American  ?Date Value Ref Range Status  ?12/31/2019 57 (L) > OR = 60 mL/min/1.785mFinal  ? ?eGFR  ?Date Value Ref Range Status  ?01/10/2021 61 > OR = 60 mL/min/1.7333minal  ?  Comment:  ?  The eGFR is based on the CKD-EPI 2021 equation. To calculate  ?the new eGFR from a previous Creatinine or Cystatin C ?result, go to https://www.kidney.org/professionals/ ?kdoqi/gfr%5Fcalculator ?  ?  ?  ?  ?  Passed - CBC within normal limits and completed in the last 12 months  ?  WBC  ?Date Value Ref Range Status  ?01/10/2021 7.8 3.8 - 10.8 Thousand/uL Final  ? ?RBC  ?Date Value Ref Range Status  ?01/10/2021 5.34 4.20 - 5.80 Million/uL Final  ? ?Hemoglobin  ?Date Value Ref Range Status  ?01/10/2021 15.8 13.2 - 17.1 g/dL Final  ?09/04/2015 16.8 12.6 - 17.7 g/dL Final  ? ?HCT  ?Date Value Ref Range Status  ?01/10/2021 47.5 38.5 - 50.0 % Final  ? ?Hematocrit  ?Date Value Ref Range Status  ?09/04/2015 47.6 37.5 - 51.0 % Final  ? ?MCHC  ?Date Value Ref Range Status  ?01/10/2021 33.3 32.0 - 36.0 g/dL Final  ? ?MCH  ?Date Value Ref Range Status  ?01/10/2021 29.6 27.0 - 33.0 pg Final  ? ?MCV  ?Date Value Ref Range Status  ?01/10/2021 89.0 80.0 - 100.0 fL Final  ?09/04/2015 85 79 - 97 fL Final  ? ?No results found for: PLTCOUNTKUC, LABPLAT, POCMolenaDW  ?Date Value Ref Range Status  ?01/10/2021 13.1 11.0 - 15.0 % Final  ?09/04/2015 13.6 12.3 - 15.4 % Final  ? ?  ?  ?  ? ? ? ?

## 2021-08-28 ENCOUNTER — Telehealth: Payer: Self-pay | Admitting: Family Medicine

## 2021-08-28 DIAGNOSIS — E1142 Type 2 diabetes mellitus with diabetic polyneuropathy: Secondary | ICD-10-CM

## 2021-08-29 NOTE — Telephone Encounter (Signed)
This med was discontinued 01/24/21. ?Requested Prescriptions  ?Pending Prescriptions Disp Refills  ?? metFORMIN (GLUCOPHAGE-XR) 500 MG 24 hr tablet [Pharmacy Med Name: METFORMIN HCL ER 500 MG TABLET] 90 tablet 0  ?  Sig: Take 1 tablet (500 mg total) by mouth daily with breakfast.  ?  ? Endocrinology:  Diabetes - Biguanides Failed - 08/28/2021 11:39 AM  ?  ?  Failed - HBA1C is between 0 and 7.9 and within 180 days  ?  Hgb A1c MFr Bld  ?Date Value Ref Range Status  ?01/10/2021 6.0 (H) <5.7 % of total Hgb Final  ?  Comment:  ?  For someone without known diabetes, a hemoglobin  ?A1c value between 5.7% and 6.4% is consistent with ?prediabetes and should be confirmed with a  ?follow-up test. ?. ?For someone with known diabetes, a value <7% ?indicates that their diabetes is well controlled. A1c ?targets should be individualized based on duration of ?diabetes, age, comorbid conditions, and other ?considerations. ?. ?This assay result is consistent with an increased risk ?of diabetes. ?. ?Currently, no consensus exists regarding use of ?hemoglobin A1c for diagnosis of diabetes for children. ?. ?  ?   ?  ?  Failed - B12 Level in normal range and within 720 days  ?  No results found for: VITAMINB12   ?  ?  Failed - Valid encounter within last 6 months  ?  Recent Outpatient Visits   ?      ? 3 months ago Acute non-recurrent frontal sinusitis  ? [place], DO  ? 4 months ago Acute non-recurrent frontal sinusitis  ? [place], DO  ? 7 months ago Annual physical exam  ? [place], DO  ? 10 months ago Chronic pain of right upper extremity  ? [place], DO  ? 1 year ago Type 2 diabetes mellitus with diabetic polyneuropathy, without long-term current use of insulin ([place])  ? [place], DO  ?  ?  ? ?  ?  ?  Passed - Cr in  normal range and within 360 days  ?  Creat  ?Date Value Ref Range Status  ?01/10/2021 1.17 0.70 - 1.22 mg/dL Final  ?   ?  ?  Passed - eGFR in normal range and within 360 days  ?  GFR, Est African American  ?Date Value Ref Range Status  ?12/31/2019 66 > OR = 60 mL/min/1.46m Final  ? ?GFR, Est Non African American  ?Date Value Ref Range Status  ?12/31/2019 57 (L) > OR = 60 mL/min/1.785mFinal  ? ?eGFR  ?Date Value Ref Range Status  ?01/10/2021 61 > OR = 60 mL/min/1.7335minal  ?  Comment:  ?  The eGFR is based on the CKD-EPI 2021 equation. To calculate  ?the new eGFR from a previous Creatinine or Cystatin C ?result, go to https://www.kidney.org/professionals/ ?kdoqi/gfr%5Fcalculator ?  ?   ?  ?  Passed - CBC within normal limits and completed in the last 12 months  ?  WBC  ?Date Value Ref Range Status  ?01/10/2021 7.8 3.8 - 10.8 Thousand/uL Final  ? ?RBC  ?Date Value Ref Range Status  ?01/10/2021 5.34 4.20 - 5.80 Million/uL Final  ? ?Hemoglobin  ?Date Value Ref Range Status  ?01/10/2021 15.8 13.2 - 17.1 g/dL Final  ?09/04/2015 16.8 12.6 - 17.7 g/dL Final  ? ?HCT  ?  Date Value Ref Range Status  ?01/10/2021 47.5 38.5 - 50.0 % Final  ? ?Hematocrit  ?Date Value Ref Range Status  ?09/04/2015 47.6 37.5 - 51.0 % Final  ? ?MCHC  ?Date Value Ref Range Status  ?01/10/2021 33.3 32.0 - 36.0 g/dL Final  ? ?MCH  ?Date Value Ref Range Status  ?01/10/2021 29.6 27.0 - 33.0 pg Final  ? ?MCV  ?Date Value Ref Range Status  ?01/10/2021 89.0 80.0 - 100.0 fL Final  ?09/04/2015 85 79 - 97 fL Final  ? ?No results found for: PLTCOUNTKUC, LABPLAT, [place] ?RDW  ?Date Value Ref Range Status  ?01/10/2021 13.1 11.0 - 15.0 % Final  ?09/04/2015 13.6 12.3 - 15.4 % Final  ? ?  ?  ?  ? ? ?

## 2021-08-31 MED ORDER — METFORMIN HCL ER 500 MG PO TB24: 500.0000 mg | ORAL_TABLET | Freq: Every day | ORAL | 3 refills | Status: DC

## 2021-08-31 NOTE — Telephone Encounter (Signed)
Pt called about refill for metFORMIN (GLUCOPHAGE-XR) 500 MG 24 hr tablet   being sent to [place] states he still takes this medication/ please advise  ?

## 2021-08-31 NOTE — Telephone Encounter (Signed)
Sure. I can re order it. ? ?It looks like our last visit in 2022, he asked if he could come off of it, and we agreed to do a trial off of the medication. ? ?Now it sounds like he has resumed it. ? ?Nobie Putnam, DO ?[facility] ?[facility] ?08/31/2021, 11:29 AM ? ?

## 2021-08-31 NOTE — Addendum Note (Signed)
Addended by: Olin Hauser on: 08/31/2021 11:29 AM ? ? Modules accepted: Orders ? ?

## 2021-10-15 ENCOUNTER — Telehealth: Payer: Self-pay

## 2021-10-15 NOTE — Telephone Encounter (Signed)
Left a message for patient to call back and schedule their Medicare Annual Wellness Visit (AWV) virtually, telephone or face to face. ?  ?Kariann Wecker, CMA ?[phone]  ?

## 2022-01-17 ENCOUNTER — Encounter: Payer: Self-pay | Admitting: Internal Medicine

## 2022-01-17 ENCOUNTER — Ambulatory Visit (INDEPENDENT_AMBULATORY_CARE_PROVIDER_SITE_OTHER): Payer: Medicare HMO | Admitting: Internal Medicine

## 2022-01-17 VITALS — BP 127/84 | HR 88 | Ht 70.0 in | Wt 159.8 lb

## 2022-01-17 DIAGNOSIS — M7022 Olecranon bursitis, left elbow: Secondary | ICD-10-CM

## 2022-01-17 MED ORDER — NAPROXEN 375 MG PO TABS: 375.0000 mg | ORAL_TABLET | Freq: Two times a day (BID) | ORAL | 0 refills | Status: DC

## 2022-01-17 NOTE — Patient Instructions (Signed)
Elbow Bursitis  Elbow (olecranon) bursitis is the swelling of the fluid-filled sac (bursa) between the tip of your elbow and your skin. A bursa acts as a cushion and protects the joint. If the bursa becomes irritated, it can fill with extra fluid and become inflamed. This condition is also called olecranon bursitis. What are the causes? This condition may be caused by: An elbow injury, such as falling onto the elbow. Leaning on hard surfaces for long periods of time. Infection from an injury that breaks the skin near the elbow. A bone growth (spur) that forms at the tip of the elbow. A medical condition that causes inflammation, such as gout or rheumatoid arthritis. Sometimes the cause is not known. What are the signs or symptoms? The first sign of elbow bursitis is usually swelling at the tip of the elbow. This can grow to be about the size of a golf ball. Swelling may start suddenly or develop gradually. Other symptoms may include: Pain when bending or leaning on the elbow. Stiffness, or not being able to move the elbow normally. If bursitis is caused by an infection, you may have: Redness, warmth, and tenderness of the elbow. Drainage of pus from the swollen area over the elbow, if the skin breaks open. How is this diagnosed? This condition may be diagnosed based on: Your symptoms and medical history. Any recent injuries you have had. A physical exam. X-rays to check for a bone spur or fracture. Draining fluid from the bursa to test it for infection. Blood tests to rule out gout or rheumatoid arthritis. How is this treated? Treatment for this condition depends on the cause. Treatment may include: Medicines. These may include: Over-the-counter medicines to relieve pain and inflammation. Antibiotic medicines if there is an infection. Injections of anti-inflammatory medicines (steroids). Draining fluid from the bursa. Wrapping your elbow with a bandage or compression arm  sleeve. Wearing elbow pads. If these treatments do not help, you may need surgery to remove the bursa. Follow these instructions at home: Medicines Take over-the-counter and prescription medicines only as told by your health care provider. If you were prescribed an antibiotic medicine, take it as told by your health care provider. Do not stop taking the antibiotic even if you start to feel better. Managing pain, stiffness, and swelling     If directed, put ice on the affected area. To do this: Put ice in a plastic bag. Place a towel between your skin and the bag. Leave the ice on for 20 minutes, 2-3 times a day. Remove the ice if your skin turns bright red. This is very important. If you cannot feel pain, heat, or cold, you have a greater risk of damage to the area. If directed, apply heat to the affected area as often as told by your health care provider. Use the heat source that your health care provider recommends, such as a moist heat pack or a heating pad. Place a towel between your skin and the heat source. Leave the heat on 20-30 minutes. Remove the heat if your skin turns bright red. This is especially important if you are unable to feel pain, heat, or cold. You may have a greater risk of getting burned. If your bursitis is caused by an injury, rest your elbow and wear your bandage or sleeve as told by your health care provider. Use elbow pads or elbow wraps to cushion your elbow and control swelling as needed. General instructions Avoid any activities that cause elbow pain. Ask   your health care provider what activities are safe for you. Keep all follow-up visits. This is important. Contact a health care provider if: You have a fever. Your symptoms do not get better with treatment. You have pain or swelling that gets worse, or it goes away and then comes back. You have pus draining from your elbow. You have redness around the elbow area. Your elbow is warm to the touch. Get  help right away if: You have trouble moving your arm, hand, or fingers. Summary Elbow (olecranon) bursitis is the swelling of the fluid-filled sac (bursa) between the tip of your elbow and your skin. Treatment for elbow bursitis depends on the cause. It may include medicines to relieve pain and inflammation, antibiotic medicines to treat an infection, or draining fluid from your elbow. Contact a health care provider if your symptoms do not get better with treatment, or if your symptoms go away and then come back. This information is not intended to replace advice given to you by your health care provider. Make sure you discuss any questions you have with your health care provider. Document Revised: 05/08/2021 Document Reviewed: 05/08/2021 Elsevier Patient Education  2023 [employer]  

## 2022-01-17 NOTE — Progress Notes (Signed)
Subjective:    Patient ID: [name], male    DOB: [birth year], 88 y.o.   MRN: [id number]  HPI  Patient presents to clinic today with complaint of left elbow swelling.  This started 2-3 days. He denies redness, warmth or pain. He denies any injury to the area.  He has a history of gout but reports this feels completely different.  Review of Systems     Past Medical History:  Diagnosis Date   Diabetes mellitus without complication (HCC)    Hyperlipidemia    Hypertension    Neuropathy    Prostate cancer ([place])     Current Outpatient Medications  Medication Sig Dispense Refill   Alpha Lipoic Acid 200 MG CAPS Take 200 mg by mouth 3 (three) times daily. 90 capsule 0   colchicine 0.6 MG tablet If acute flare only - Day 1: take 2 pills first day, then repeat 1 pill within two hours. Day 2 - take 1 pill daily for up to max 7 days or stop when gout flare resolves. 20 tablet 2   ezetimibe (ZETIA) 10 MG tablet Take 1 tablet (10 mg total) by mouth daily. 90 tablet 3   fluticasone (FLONASE) 50 MCG/ACT nasal spray Place 2 sprays into both nostrils daily. Use for 4-6 weeks then stop and use seasonally or as needed. 16 g 3   gabapentin (NEURONTIN) 600 MG tablet Take 1 tablet (600 mg total) by mouth 3 (three) times daily. 270 tablet 3   glucosamine-chondroitin 500-400 MG tablet Take 1 tablet by mouth 3 (three) times daily as needed.      levofloxacin (LEVAQUIN) 500 MG tablet Take 1 tablet (500 mg total) by mouth daily. For 7 days 7 tablet 0   lisinopril (ZESTRIL) 5 MG tablet Take 1 tablet (5 mg total) by mouth daily. 90 tablet 3   metFORMIN (GLUCOPHAGE-XR) 500 MG 24 hr tablet Take 1 tablet (500 mg total) by mouth daily with breakfast. 90 tablet 3   VOLTAREN 1 % GEL 4 GRAMS, Transdermal, four times daily, as needed 100 g 3   No current facility-administered medications for this visit.    No Known Allergies  Family History  Problem Relation Age of Onset   Stroke Mother    Cancer Mother         colon cancer   COPD Father    COPD Brother    Hearing loss Son     Social History   Socioeconomic History   Marital status: Widowed    Spouse name: Not on file   Number of children: Not on file   Years of education: Not on file   Highest education level: High school graduate  Occupational History   Not on file  Tobacco Use   Smoking status: Former    Types: Cigarettes    Quit date: 05/27/1978    Years since quitting: 43.6   Smokeless tobacco: Never  Vaping Use   Vaping Use: Never used  Substance and Sexual Activity   Alcohol use: No    Alcohol/week: 0.0 standard drinks of alcohol   Drug use: No   Sexual activity: Not on file  Other Topics Concern   Not on file  Social History Narrative   Lives in retirement community    Social Determinants of Health   Financial Resource Strain: Low Risk  (12/23/2017)   Overall Financial Resource Strain (CARDIA)    Difficulty of Paying Living Expenses: Not hard at all  Food Insecurity: No Food  Insecurity (12/23/2017)   Hunger Vital Sign    Worried About Running Out of Food in the Last Year: Never true    Ran Out of Food in the Last Year: Never true  Transportation Needs: No Transportation Needs (12/23/2017)   PRAPARE - Hydrologist (Medical): No    Lack of Transportation (Non-Medical): No  Physical Activity: Insufficiently Active (12/29/2018)   Exercise Vital Sign    Days of Exercise per Week: 3 days    Minutes of Exercise per Session: 20 min  Stress: No Stress Concern Present (12/23/2017)   [place]    Feeling of Stress : Not at all  Social Connections: Moderately Integrated (12/23/2017)   Social Connection and Isolation Panel [NHANES]    Frequency of Communication with Friends and Family: Twice a week    Frequency of Social Gatherings with Friends and Family: More than three times a week    Attends Religious Services: More than 4  times per year    Active Member of [employer] or Organizations: Yes    Attends Archivist Meetings: More than 4 times per year    Marital Status: Widowed  Intimate Partner Violence: Not At Risk (12/23/2017)   Humiliation, Afraid, Rape, and Kick questionnaire    Fear of Current or Ex-Partner: No    Emotionally Abused: No    Physically Abused: No    Sexually Abused: No     Constitutional: Denies fever, malaise, fatigue, headache or abrupt weight changes.  Respiratory: Denies difficulty breathing, shortness of breath, cough or sputum production.   Cardiovascular: Denies chest pain, chest tightness, palpitations or swelling in the hands or feet.  Musculoskeletal: Pt reports left elbow swelling. Denies decrease in range of motion, difficulty with gait, muscle pain or joint pain.  Skin: Denies redness, rashes, lesions or ulcercations.  Neurological: Denies numbness, tingling, weakness or problems with balance and coordination.    No other specific complaints in a complete review of systems (except as listed in HPI above).  Objective:   Physical Exam BP 127/84   Pulse 88   Ht '5\' 10"'$  (1.778 m)   Wt 159 lb 12.8 oz (72.5 kg)   SpO2 97%   BMI 22.93 kg/m   Wt Readings from Last 3 Encounters:  05/29/21 147 lb (66.7 kg)  04/17/21 147 lb (66.7 kg)  01/24/21 147 lb 3.2 oz (66.8 kg)    General: Appears his stated age, well developed, well nourished in NAD. Skin: Warm, dry and intact. No redness, warmth or rash noted of the left elbow. Cardiovascular: Normal rate and rhythm. Pulmonary/Chest: Normal effort and positive vesicular breath sounds. No respiratory distress. No wheezes, rales or ronchi noted.  Musculoskeletal: Normal flexion, extension and rotation of the left elbow.  Olecranon bursitis noted of the left elbow. Neurological: Alert and oriented.   BMET    Component Value Date/Time   NA 137 01/10/2021 0807   NA 140 09/04/2015 0824   K 4.8 01/10/2021 0807   CL 99  01/10/2021 0807   CO2 31 01/10/2021 0807   GLUCOSE 108 01/10/2021 0807   BUN 27 (H) 01/10/2021 0807   BUN 25 09/04/2015 0824   CREATININE 1.17 01/10/2021 0807   CALCIUM 9.7 01/10/2021 0807   GFRNONAA 57 (L) 12/31/2019 0922   GFRAA 66 12/31/2019 0922    Lipid Panel     Component Value Date/Time   CHOL 206 (H) 01/10/2021 0807   CHOL 182  09/04/2015 0824   TRIG 245 (H) 01/10/2021 0807   HDL 36 (L) 01/10/2021 0807   HDL 32 (L) 09/04/2015 0824   CHOLHDL 5.7 (H) 01/10/2021 0807   VLDL 51 (H) 06/25/2016 0001   LDLCALC 130 (H) 01/10/2021 0807    CBC    Component Value Date/Time   WBC 7.8 01/10/2021 0807   RBC 5.34 01/10/2021 0807   HGB 15.8 01/10/2021 0807   HGB 16.8 09/04/2015 0824   HCT 47.5 01/10/2021 0807   HCT 47.6 09/04/2015 0824   PLT 156 01/10/2021 0807   PLT 144 (L) 09/04/2015 0824   MCV 89.0 01/10/2021 0807   MCV 85 09/04/2015 0824   MCH 29.6 01/10/2021 0807   MCHC 33.3 01/10/2021 0807   RDW 13.1 01/10/2021 0807   RDW 13.6 09/04/2015 0824   LYMPHSABS 3,167 01/10/2021 0807   LYMPHSABS 2.2 09/04/2015 0824   MONOABS 580 06/25/2016 0001   EOSABS 172 01/10/2021 0807   EOSABS 0.1 09/04/2015 0824   BASOSABS 70 01/10/2021 0807   BASOSABS 0.0 09/04/2015 0824    Hgb A1C Lab Results  Component Value Date   HGBA1C 6.0 (H) 01/10/2021           Assessment & Plan:   Olecranon Bursitis, Left Elbow:  I do not want to withdraw the fluid given his history of diabetes even though his diabetes is well controlled due to concern for risk for infection I do not want to give him steroids given his history of diabetes He has had CKD in the past but last GFR was normal We will try naproxen 375 mg twice daily for 5 days Encourage rest, ice and compression-Ace wrap applied today.  Advised him to wear this during the day and take this off at night  Follow-up with PCP in 1 week to consider fluid removal versus steroid injection  Webb Silversmith, NP

## 2022-01-23 ENCOUNTER — Encounter: Payer: Self-pay | Admitting: Family Medicine

## 2022-01-23 ENCOUNTER — Ambulatory Visit (INDEPENDENT_AMBULATORY_CARE_PROVIDER_SITE_OTHER): Payer: Medicare HMO | Admitting: Family Medicine

## 2022-01-23 VITALS — BP 109/78 | HR 75 | Ht 70.0 in | Wt 162.4 lb

## 2022-01-23 DIAGNOSIS — M7022 Olecranon bursitis, left elbow: Secondary | ICD-10-CM | POA: Diagnosis not present

## 2022-01-23 MED ORDER — DICLOFENAC SODIUM 1 % EX GEL: 2.0000 g | Freq: Four times a day (QID) | CUTANEOUS | 2 refills | Status: AC | PRN

## 2022-01-23 NOTE — Progress Notes (Unsigned)
Subjective:    Patient ID: [name], male    DOB: 1933/11/18, 88 y.o.   MRN: [id number]  [name] is a 88 y.o. male presenting on 01/23/2022 for Joint Swelling   HPI  Left elbow Olecranon Bursitis Recently seen by Webb Silversmith, FNP here at [facility] on 01/17/22, he was given naproxen short term 5 days. And doing RICE therapy with ACE wrap Some improvement No pain or redness.      01/24/2021   10:35 AM 10/24/2020    2:05 PM 06/02/2020    8:18 AM  Depression screen PHQ 2/9  Decreased Interest 0 0 0  Down, Depressed, Hopeless 0 0 0  PHQ - 2 Score 0 0 0  Altered sleeping 0 0   Tired, decreased energy 0 0   Change in appetite 0 0   Feeling bad or failure about yourself  0 0   Trouble concentrating 0 0   Moving slowly or fidgety/restless 0 0   Suicidal thoughts 0 0   PHQ-9 Score 0 0   Difficult doing work/chores Not difficult at all Not difficult at all     Social History   Tobacco Use   Smoking status: Former    Types: Cigarettes    Quit date: 05/27/1978    Years since quitting: 43.6   Smokeless tobacco: Never  Vaping Use   Vaping Use: Never used  Substance Use Topics   Alcohol use: No    Alcohol/week: 0.0 standard drinks of alcohol   Drug use: No    Review of Systems Per HPI unless specifically indicated above     Objective:    BP 109/78   Pulse 75   Ht '5\' 10"'  (1.778 m)   Wt 162 lb 6.4 oz (73.7 kg)   SpO2 94%   BMI 23.30 kg/m   Wt Readings from Last 3 Encounters:  01/23/22 162 lb 6.4 oz (73.7 kg)  01/17/22 159 lb 12.8 oz (72.5 kg)  05/29/21 147 lb (66.7 kg)    Physical Exam Results for orders placed or performed in visit on 01/09/21  TSH  Result Value Ref Range   TSH 2.21 0.40 - 4.50 mIU/L  PSA  Result Value Ref Range   PSA 0.07 < OR = 4.00 ng/mL  Lipid panel  Result Value Ref Range   Cholesterol 206 (H) <200 mg/dL   HDL 36 (L) > OR = 40 mg/dL   Triglycerides 245 (H) <150 mg/dL   LDL Cholesterol (Calc) 130 (H) mg/dL (calc)   Total  CHOL/HDL Ratio 5.7 (H) <5.0 (calc)   Non-HDL Cholesterol (Calc) 170 (H) <130 mg/dL (calc)  COMPLETE METABOLIC PANEL WITH GFR  Result Value Ref Range   Glucose, Bld 108 65 - 139 mg/dL   BUN 27 (H) 7 - 25 mg/dL   Creat 1.17 0.70 - 1.22 mg/dL   eGFR 61 > OR = 60 mL/min/1.13m   BUN/Creatinine Ratio 23 (H) 6 - 22 (calc)   Sodium 137 135 - 146 mmol/L   Potassium 4.8 3.5 - 5.3 mmol/L   Chloride 99 98 - 110 mmol/L   CO2 31 20 - 32 mmol/L   Calcium 9.7 8.6 - 10.3 mg/dL   Total Protein 6.9 6.1 - 8.1 g/dL   Albumin 4.2 3.6 - 5.1 g/dL   Globulin 2.7 1.9 - 3.7 g/dL (calc)   AG Ratio 1.6 1.0 - 2.5 (calc)   Total Bilirubin 0.5 0.2 - 1.2 mg/dL   Alkaline phosphatase (APISO) 53 35 -  144 U/L   AST 12 10 - 35 U/L   ALT 5 (L) 9 - 46 U/L  CBC with Differential/Platelet  Result Value Ref Range   WBC 7.8 3.8 - 10.8 Thousand/uL   RBC 5.34 4.20 - 5.80 Million/uL   Hemoglobin 15.8 13.2 - 17.1 g/dL   HCT 47.5 38.5 - 50.0 %   MCV 89.0 80.0 - 100.0 fL   MCH 29.6 27.0 - 33.0 pg   MCHC 33.3 32.0 - 36.0 g/dL   RDW 13.1 11.0 - 15.0 %   Platelets 156 140 - 400 Thousand/uL   MPV 10.2 7.5 - 12.5 fL   Neutro Abs 3,650 1,500 - 7,800 cells/uL   Lymphs Abs 3,167 850 - 3,900 cells/uL   Absolute Monocytes 741 200 - 950 cells/uL   Eosinophils Absolute 172 15 - 500 cells/uL   Basophils Absolute 70 0 - 200 cells/uL   Neutrophils Relative % 46.8 %   Total Lymphocyte 40.6 %   Monocytes Relative 9.5 %   Eosinophils Relative 2.2 %   Basophils Relative 0.9 %  Hemoglobin A1c  Result Value Ref Range   Hgb A1c MFr Bld 6.0 (H) <5.7 % of total Hgb   Mean Plasma Glucose 126 mg/dL   eAG (mmol/L) 7.0 mmol/L      Assessment & Plan:   Problem List Items Addressed This Visit   None   No orders of the defined types were placed in this encounter.     Follow up plan: No follow-ups on file.   Nobie Putnam, [facility] 01/23/2022, 4:59 PM

## 2022-01-23 NOTE — Patient Instructions (Addendum)
Thank you for coming to the office today.  You have an Olecranon Bursitis (elbow swelling) - this is due to inflammation within the joint, it is a problem we commonly see in patients with arthritis or can be triggered by frequent pressure on elbow (such as leaning on it for long periods of time)  - Usually this will resolve with time, body will re-absorb the fluid and it will go down  Use RICE therapy: - R - Rest / relative rest with activity modification avoid overuse of joint - I - Ice packs (make sure you use a towel or sock / something to protect skin) - C - Compression with Flexible Sleeve or ACE wrap to apply pressure and reduce swelling allowing more support - E - Elevation - if significant swelling lift above heart level  Try the Diclofenac generic Voltaren  - It is safe to take Tylenol Ext Str '500mg'$  tabs - take 1 to 2 (max dose '1000mg'$ ) every 6 hours as needed for breakthrough pain, max 24 hour daily dose is 6 to 8 tablets or '4000mg'$   - If continued pressure and leaning on elbow it may last longer or may not go away, sometimes if it is persistent it may require drainage of fluid, however in most causes we are cautious to remove fluid because it will usually come back quickly  - Also given rx topical Diclofenac gel to reduce inflammation and swelling in joint, this is arthritis rx cream can use on knees as well - if cannot get covered, sometimes limited by insurance on this one, let me know  If worsening fever, redness, chills, pain or swelling then we may need to cover with alternative antibiotic or drain the elbow, please notify office and if worsening symptoms then we may have you go to hospital for evaluation.   Please schedule a Follow-up Appointment to: Return if symptoms worsen or fail to improve.  If you have any other questions or concerns, please feel free to call the office or send a message through [place]. You may also schedule an earlier appointment if  necessary.  Additionally, you may be receiving a survey about your experience at our office within a few days to 1 week by e-mail or mail. We value your feedback.  Nobie Putnam, DO [place]

## 2022-02-20 ENCOUNTER — Other Ambulatory Visit: Payer: Medicare HMO

## 2022-02-20 ENCOUNTER — Other Ambulatory Visit: Payer: Self-pay

## 2022-02-20 DIAGNOSIS — Z0000 Encounter for general adult medical examination without abnormal findings: Secondary | ICD-10-CM | POA: Diagnosis not present

## 2022-02-20 DIAGNOSIS — E1169 Type 2 diabetes mellitus with other specified complication: Secondary | ICD-10-CM | POA: Diagnosis not present

## 2022-02-20 DIAGNOSIS — E1142 Type 2 diabetes mellitus with diabetic polyneuropathy: Secondary | ICD-10-CM

## 2022-02-20 DIAGNOSIS — Z8546 Personal history of malignant neoplasm of prostate: Secondary | ICD-10-CM

## 2022-02-20 DIAGNOSIS — E785 Hyperlipidemia, unspecified: Secondary | ICD-10-CM | POA: Diagnosis not present

## 2022-02-20 DIAGNOSIS — I129 Hypertensive chronic kidney disease with stage 1 through stage 4 chronic kidney disease, or unspecified chronic kidney disease: Secondary | ICD-10-CM | POA: Diagnosis not present

## 2022-02-20 DIAGNOSIS — N183 Chronic kidney disease, stage 3 unspecified: Secondary | ICD-10-CM | POA: Diagnosis not present

## 2022-02-21 LAB — COMPREHENSIVE METABOLIC PANEL
AG Ratio: 1.5 (calc) (ref 1.0–2.5)
ALT: 8 U/L — ABNORMAL LOW (ref 9–46)
AST: 17 U/L (ref 10–35)
Albumin: 4.8 g/dL (ref 3.6–5.1)
Alkaline phosphatase (APISO): 49 U/L (ref 35–144)
BUN/Creatinine Ratio: 23 (calc) — ABNORMAL HIGH (ref 6–22)
BUN: 28 mg/dL — ABNORMAL HIGH (ref 7–25)
CO2: 26 mmol/L (ref 20–32)
Calcium: 10 mg/dL (ref 8.6–10.3)
Chloride: 99 mmol/L (ref 98–110)
Creat: 1.21 mg/dL (ref 0.70–1.22)
Globulin: 3.1 g/dL (calc) (ref 1.9–3.7)
Glucose, Bld: 164 mg/dL — ABNORMAL HIGH (ref 65–139)
Potassium: 4.8 mmol/L (ref 3.5–5.3)
Sodium: 137 mmol/L (ref 135–146)
Total Bilirubin: 0.6 mg/dL (ref 0.2–1.2)
Total Protein: 7.9 g/dL (ref 6.1–8.1)

## 2022-02-21 LAB — CBC WITH DIFFERENTIAL/PLATELET
Absolute Monocytes: 599 cells/uL (ref 200–950)
Basophils Absolute: 51 cells/uL (ref 0–200)
Basophils Relative: 0.7 %
Eosinophils Absolute: 161 cells/uL (ref 15–500)
Eosinophils Relative: 2.2 %
HCT: 50.3 % — ABNORMAL HIGH (ref 38.5–50.0)
Hemoglobin: 17.2 g/dL — ABNORMAL HIGH (ref 13.2–17.1)
Lymphs Abs: 2643 cells/uL (ref 850–3900)
MCH: 29.9 pg (ref 27.0–33.0)
MCHC: 34.2 g/dL (ref 32.0–36.0)
MCV: 87.3 fL (ref 80.0–100.0)
MPV: 10.2 fL (ref 7.5–12.5)
Monocytes Relative: 8.2 %
Neutro Abs: 3847 cells/uL (ref 1500–7800)
Neutrophils Relative %: 52.7 %
Platelets: 164 10*3/uL (ref 140–400)
RBC: 5.76 10*6/uL (ref 4.20–5.80)
RDW: 13.1 % (ref 11.0–15.0)
Total Lymphocyte: 36.2 %
WBC: 7.3 10*3/uL (ref 3.8–10.8)

## 2022-02-21 LAB — LIPID PANEL
Cholesterol: 238 mg/dL — ABNORMAL HIGH (ref <200)
HDL: 36 mg/dL — ABNORMAL LOW (ref >=40)
LDL Cholesterol (Calc): 146 mg/dL (calc) — ABNORMAL HIGH
Non-HDL Cholesterol (Calc): 202 mg/dL (calc) — ABNORMAL HIGH (ref <130)
Total CHOL/HDL Ratio: 6.6 (calc) — ABNORMAL HIGH (ref <5.0)
Triglycerides: 363 mg/dL — ABNORMAL HIGH (ref <150)

## 2022-02-21 LAB — HEMOGLOBIN A1C
Hgb A1c MFr Bld: 6.5 % of total Hgb — ABNORMAL HIGH (ref <5.7)
Mean Plasma Glucose: 140 mg/dL
eAG (mmol/L): 7.7 mmol/L

## 2022-02-21 LAB — TSH: TSH: 1.5 mIU/L (ref 0.40–4.50)

## 2022-02-21 LAB — PSA: PSA: 0.12 ng/mL (ref <=4.00)

## 2022-02-27 ENCOUNTER — Encounter: Payer: Self-pay | Admitting: Family Medicine

## 2022-02-27 ENCOUNTER — Ambulatory Visit (INDEPENDENT_AMBULATORY_CARE_PROVIDER_SITE_OTHER): Payer: Medicare HMO | Admitting: Family Medicine

## 2022-02-27 VITALS — BP 116/61 | HR 64 | Ht 70.0 in | Wt 162.2 lb

## 2022-02-27 DIAGNOSIS — Z0000 Encounter for general adult medical examination without abnormal findings: Secondary | ICD-10-CM | POA: Diagnosis not present

## 2022-02-27 DIAGNOSIS — E785 Hyperlipidemia, unspecified: Secondary | ICD-10-CM

## 2022-02-27 DIAGNOSIS — G720 Drug-induced myopathy: Secondary | ICD-10-CM

## 2022-02-27 DIAGNOSIS — E1169 Type 2 diabetes mellitus with other specified complication: Secondary | ICD-10-CM | POA: Diagnosis not present

## 2022-02-27 DIAGNOSIS — J3089 Other allergic rhinitis: Secondary | ICD-10-CM | POA: Diagnosis not present

## 2022-02-27 DIAGNOSIS — I129 Hypertensive chronic kidney disease with stage 1 through stage 4 chronic kidney disease, or unspecified chronic kidney disease: Secondary | ICD-10-CM | POA: Diagnosis not present

## 2022-02-27 DIAGNOSIS — G63 Polyneuropathy in diseases classified elsewhere: Secondary | ICD-10-CM | POA: Diagnosis not present

## 2022-02-27 DIAGNOSIS — Z23 Encounter for immunization: Secondary | ICD-10-CM

## 2022-02-27 DIAGNOSIS — E1142 Type 2 diabetes mellitus with diabetic polyneuropathy: Secondary | ICD-10-CM

## 2022-02-27 DIAGNOSIS — M159 Polyosteoarthritis, unspecified: Secondary | ICD-10-CM

## 2022-02-27 DIAGNOSIS — N183 Chronic kidney disease, stage 3 unspecified: Secondary | ICD-10-CM | POA: Diagnosis not present

## 2022-02-27 MED ORDER — AZELASTINE HCL 0.1 % NA SOLN: 2.0000 | Freq: Two times a day (BID) | NASAL | 12 refills | Status: DC

## 2022-02-27 MED ORDER — PREGABALIN 25 MG PO CAPS: 25.0000 mg | ORAL_CAPSULE | Freq: Two times a day (BID) | ORAL | 0 refills | Status: DC

## 2022-02-27 MED ORDER — MONTELUKAST SODIUM 10 MG PO TABS: 10.0000 mg | ORAL_TABLET | Freq: Every day | ORAL | 3 refills | Status: DC

## 2022-02-27 NOTE — Assessment & Plan Note (Signed)
Well controlled HTN Complication: CKD-III Improved Cr  Plan: 1. Continue Lisinopril 5mg daily 2. Improve hydration, avoid NSAIDs may use Tylenol and topical NSAID, lifestyle regular exercise, low sodium 3. Monitor BP outside office if he can occasionally 4. Follow-up 6 months 

## 2022-02-27 NOTE — Assessment & Plan Note (Signed)
Controlled History of statin intolerance  Plan: 1. Continue Zetia 2. Encourage improved lifestyle - low carb/cholesterol, reduce portion size, continue improving regular exercise

## 2022-02-27 NOTE — Patient Instructions (Addendum)
Thank you for coming to the office today.  Nose Sprays Fluticasone (Flonase) this is the nasal steroid, you have been on this a long time, and it sounds like it is not very effective.  Ipratropium (Atrovent) this is the decongestant spray, short term only, you have been on this before as well.  New rx Azelastine (Astelin) this is an allergy nasal spray  Allergy pills OTC Claritin (Loratadine), Zyrtec (Cetirizine), Allegra (Fexofenadine) you can choose one OTC med and take daily.  New rx Montelukast (singulair) once nightly allergy pill  Stop Gabapentin Start Pregabalin Lyrica '25mg'$  twice a day, we can dose increase in 3-4 weeks, let me know    Please schedule a Follow-up Appointment to: Return in about 4 months (around 06/30/2022) for 4 month follow up DM A1c Neuropathy med.  If you have any other questions or concerns, please feel free to call the office or send a message through [place]. You may also schedule an earlier appointment if necessary.  Additionally, you may be receiving a survey about your experience at our office within a few days to 1 week by e-mail or mail. We value your feedback.  Nobie Putnam, DO [place]

## 2022-02-27 NOTE — Progress Notes (Addendum)
Subjective:    Patient ID: [name], male    DOB: 06-19-1933, 88 y.o.   MRN: [id number]  [name] is a 88 y.o. male presenting on 02/27/2022 for Annual Exam   HPI  Here for Annual Physical and Lab Review.   Type 2 Diabetes Hyperlipidemia Hypertension   He is doing supplements and red light therapy, TENS machine Diet improvement similar to Keto diet. No sugars / grains / dairy. He uses almond milk Stable A1c 6.5 He is on Metformin XR '500mg'$  daily, asks about coming off of this.   On Zetia   On Lisinopril low dose '5mg'$ .  He has seen Vascular specialist in [place] and they were unable to help him restore circulation in lower extremity. He has some history of circulation and neuropathy He has poor balance  Interested to stop Gabapentin 600 TID He would like to trial Lyrica    Chronic Rhinitis he admits persistent issue with chronic rhinitis drainage in morning and throughout the day and also some sinus congestion pressure. Uses Flonase and Atrovent limited results   R Shoulder pain / Rotator Cuff Previously followed by [facility] Received cortisone injection previously 11/06/20     Hearing Loss     Health Maintenance:   Hx prostate cancer. Negative surveillance. 01/2022 0.12  Flu Shot today      02/27/2022    9:10 AM 01/24/2021   10:35 AM 10/24/2020    2:05 PM  Depression screen PHQ 2/9  Decreased Interest 1 0 0  Down, Depressed, Hopeless 1 0 0  PHQ - 2 Score 2 0 0  Altered sleeping 0 0 0  Tired, decreased energy 0 0 0  Change in appetite 0 0 0  Feeling bad or failure about yourself  0 0 0  Trouble concentrating 0 0 0  Moving slowly or fidgety/restless 0 0 0  Suicidal thoughts 0 0 0  PHQ-9 Score 2 0 0  Difficult doing work/chores Not difficult at all Not difficult at all Not difficult at all      02/27/2022    9:10 AM 01/24/2021   10:35 AM 10/24/2020    2:05 PM  GAD 7 : Generalized Anxiety Score  Nervous, Anxious, on  Edge 0 0 0  Control/stop worrying 0 0 0  Worry too much - different things 1 0 0  Trouble relaxing 0 0 0  Restless 0 0 0  Easily annoyed or irritable 1 0 0  Afraid - awful might happen 0 0 0  Total GAD 7 Score 2 0 0  Anxiety Difficulty Not difficult at all Not difficult at all Not difficult at all      Past Medical History:  Diagnosis Date   Diabetes mellitus without complication ([place])    Hyperlipidemia    Hypertension    Neuropathy    Prostate cancer [facility])    Past Surgical History:  Procedure Laterality Date   CATARACT EXTRACTION W/PHACO Left 04/24/2015   Procedure: CATARACT EXTRACTION PHACO AND INTRAOCULAR LENS PLACEMENT ([place]);  Surgeon: Estill Cotta, MD;  Location: [facility] ORS;  Service: Ophthalmology;  Laterality: Left;  [place] 01:12 AP% 25.7 CDE 28.30 fluid pack lot #[id number]   COLONOSCOPY     Social History   Socioeconomic History   Marital status: Widowed    Spouse name: Not on file   Number of children: Not on file   Years of education: Not on file   Highest education level: High school graduate  Occupational History  Not on file  Tobacco Use   Smoking status: Former    Types: Cigarettes    Quit date: 05/27/1978    Years since quitting: 43.7   Smokeless tobacco: Never  Vaping Use   Vaping Use: Never used  Substance and Sexual Activity   Alcohol use: No    Alcohol/week: 0.0 standard drinks of alcohol   Drug use: No   Sexual activity: Not on file  Other Topics Concern   Not on file  Social History Narrative   Lives in retirement community    Social Determinants of Health   Financial Resource Strain: Low Risk  (12/23/2017)   Overall Financial Resource Strain (CARDIA)    Difficulty of Paying Living Expenses: Not hard at all  Food Insecurity: No Food Insecurity (12/23/2017)   Hunger Vital Sign    Worried About Running Out of Food in the Last Year: Never true    Ran Out of Food in the Last Year: Never true  Transportation Needs: No Transportation Needs  (12/23/2017)   PRAPARE - Hydrologist (Medical): No    Lack of Transportation (Non-Medical): No  Physical Activity: Insufficiently Active (12/29/2018)   Exercise Vital Sign    Days of Exercise per Week: 3 days    Minutes of Exercise per Session: 20 min  Stress: No Stress Concern Present (12/23/2017)   [place]    Feeling of Stress : Not at all  Social Connections: Moderately Integrated (12/23/2017)   Social Connection and Isolation Panel [NHANES]    Frequency of Communication with Friends and Family: Twice a week    Frequency of Social Gatherings with Friends and Family: More than three times a week    Attends Religious Services: More than 4 times per year    Active Member of [employer] or Organizations: Yes    Attends Archivist Meetings: More than 4 times per year    Marital Status: Widowed  Intimate Partner Violence: Not At Risk (12/23/2017)   Humiliation, Afraid, Rape, and Kick questionnaire    Fear of Current or Ex-Partner: No    Emotionally Abused: No    Physically Abused: No    Sexually Abused: No   Family History  Problem Relation Age of Onset   Stroke Mother    Cancer Mother        colon cancer   COPD Father    COPD Brother    Hearing loss Son    Current Outpatient Medications on File Prior to Visit  Medication Sig   Alpha Lipoic Acid 200 MG CAPS Take 200 mg by mouth 3 (three) times daily.   colchicine 0.6 MG tablet If acute flare only - Day 1: take 2 pills first day, then repeat 1 pill within two hours. Day 2 - take 1 pill daily for up to max 7 days or stop when gout flare resolves.   diclofenac Sodium (VOLTAREN) 1 % GEL Apply 2 g topically 4 (four) times daily as needed (elbow bursitis).   ezetimibe (ZETIA) 10 MG tablet Take 1 tablet (10 mg total) by mouth daily.   fluticasone (FLONASE) 50 MCG/ACT nasal spray Place 2 sprays into both nostrils daily. Use for 4-6 weeks then  stop and use seasonally or as needed.   glucosamine-chondroitin 500-400 MG tablet Take 1 tablet by mouth 3 (three) times daily as needed.    lisinopril (ZESTRIL) 5 MG tablet Take 1 tablet (5 mg total) by mouth  daily.   metFORMIN (GLUCOPHAGE-XR) 500 MG 24 hr tablet Take 1 tablet (500 mg total) by mouth daily with breakfast.   No current facility-administered medications on file prior to visit.    Review of Systems  Constitutional:  Negative for activity change, appetite change, chills, diaphoresis, fatigue and fever.  HENT:  Negative for congestion and hearing loss.   Eyes:  Negative for visual disturbance.  Respiratory:  Negative for cough, chest tightness, shortness of breath and wheezing.   Cardiovascular:  Negative for chest pain, palpitations and leg swelling.  Gastrointestinal:  Negative for abdominal pain, constipation, diarrhea, nausea and vomiting.  Genitourinary:  Negative for dysuria, frequency and hematuria.  Musculoskeletal:  Negative for arthralgias and neck pain.  Skin:  Negative for rash.  Neurological:  Negative for dizziness, weakness, light-headedness, numbness and headaches.  Hematological:  Negative for adenopathy.  Psychiatric/Behavioral:  Negative for behavioral problems, dysphoric mood and sleep disturbance.    Per HPI unless specifically indicated above     Objective:    BP 116/61   Pulse 64   Ht '5\' 10"'$  (1.778 m)   Wt 162 lb 3.2 oz (73.6 kg)   SpO2 97%   BMI 23.27 kg/m   Wt Readings from Last 3 Encounters:  02/27/22 162 lb 3.2 oz (73.6 kg)  01/23/22 162 lb 6.4 oz (73.7 kg)  01/17/22 159 lb 12.8 oz (72.5 kg)    Physical Exam Vitals and nursing note reviewed.  Constitutional:      General: He is not in acute distress.    Appearance: He is well-developed. He is not diaphoretic.     Comments: Well-appearing, comfortable, cooperative  HENT:     Head: Normocephalic and atraumatic.  Eyes:     General:        Right eye: No discharge.        Left eye:  No discharge.     Conjunctiva/sclera: Conjunctivae normal.     Pupils: Pupils are equal, round, and reactive to light.  Neck:     Thyroid: No thyromegaly.  Cardiovascular:     Rate and Rhythm: Normal rate and regular rhythm.     Pulses: Normal pulses.     Heart sounds: Normal heart sounds. No murmur heard. Pulmonary:     Effort: Pulmonary effort is normal. No respiratory distress.     Breath sounds: Normal breath sounds. No wheezing or rales.  Abdominal:     General: Bowel sounds are normal. There is no distension.     Palpations: Abdomen is soft. There is no mass.     Tenderness: There is no abdominal tenderness.  Musculoskeletal:        General: No tenderness. Normal range of motion.     Cervical back: Normal range of motion and neck supple.     Comments: Upper / Lower Extremities: - Normal muscle tone, strength bilateral upper extremities 5/5, lower extremities 5/5  Lymphadenopathy:     Cervical: No cervical adenopathy.  Skin:    General: Skin is warm and dry.     Findings: No erythema or rash.  Neurological:     Mental Status: He is alert and oriented to person, place, and time.     Comments: Distal sensation intact to light touch all extremities  Psychiatric:        Mood and Affect: Mood normal.        Behavior: Behavior normal.        Thought Content: Thought content normal.     Comments: Well  groomed, good eye contact, normal speech and thoughts    Diabetic Foot Exam - Simple   Simple Foot Form Diabetic Foot exam was performed with the following findings: Yes 02/27/2022  9:42 AM  Visual Inspection See comments: Yes Sensation Testing Intact to touch and monofilament testing bilaterally: Yes Pulse Check Posterior Tibialis and Dorsalis pulse intact bilaterally: Yes Comments Reduced sensation to monofilament bilateral. Forefoot and heel callus formation without ulceration.      Results for orders placed or performed in visit on 02/20/22  HgB A1c  Result Value  Ref Range   Hgb A1c MFr Bld 6.5 (H) <5.7 % of total Hgb   Mean Plasma Glucose 140 mg/dL   eAG (mmol/L) 7.7 mmol/L  CBC with Differential  Result Value Ref Range   WBC 7.3 3.8 - 10.8 Thousand/uL   RBC 5.76 4.20 - 5.80 Million/uL   Hemoglobin 17.2 (H) 13.2 - 17.1 g/dL   HCT 50.3 (H) 38.5 - 50.0 %   MCV 87.3 80.0 - 100.0 fL   MCH 29.9 27.0 - 33.0 pg   MCHC 34.2 32.0 - 36.0 g/dL   RDW 13.1 11.0 - 15.0 %   Platelets 164 140 - 400 Thousand/uL   MPV 10.2 7.5 - 12.5 fL   Neutro Abs 3,847 1,500 - 7,800 cells/uL   Lymphs Abs 2,643 850 - 3,900 cells/uL   Absolute Monocytes 599 200 - 950 cells/uL   Eosinophils Absolute 161 15 - 500 cells/uL   Basophils Absolute 51 0 - 200 cells/uL   Neutrophils Relative % 52.7 %   Total Lymphocyte 36.2 %   Monocytes Relative 8.2 %   Eosinophils Relative 2.2 %   Basophils Relative 0.7 %  Comprehensive Metabolic Panel (CMET)  Result Value Ref Range   Glucose, Bld 164 (H) 65 - 139 mg/dL   BUN 28 (H) 7 - 25 mg/dL   Creat 1.21 0.70 - 1.22 mg/dL   BUN/Creatinine Ratio 23 (H) 6 - 22 (calc)   Sodium 137 135 - 146 mmol/L   Potassium 4.8 3.5 - 5.3 mmol/L   Chloride 99 98 - 110 mmol/L   CO2 26 20 - 32 mmol/L   Calcium 10.0 8.6 - 10.3 mg/dL   Total Protein 7.9 6.1 - 8.1 g/dL   Albumin 4.8 3.6 - 5.1 g/dL   Globulin 3.1 1.9 - 3.7 g/dL (calc)   AG Ratio 1.5 1.0 - 2.5 (calc)   Total Bilirubin 0.6 0.2 - 1.2 mg/dL   Alkaline phosphatase (APISO) 49 35 - 144 U/L   AST 17 10 - 35 U/L   ALT 8 (L) 9 - 46 U/L  Lipid panel  Result Value Ref Range   Cholesterol 238 (H) <200 mg/dL   HDL 36 (L) > OR = 40 mg/dL   Triglycerides 363 (H) <150 mg/dL   LDL Cholesterol (Calc) 146 (H) mg/dL (calc)   Total CHOL/HDL Ratio 6.6 (H) <5.0 (calc)   Non-HDL Cholesterol (Calc) 202 (H) <130 mg/dL (calc)  PSA  Result Value Ref Range   PSA 0.12 < OR = 4.00 ng/mL  TSH  Result Value Ref Range   TSH 1.50 0.40 - 4.50 mIU/L      Assessment & Plan:   Problem List Items Addressed This  Visit     Allergic rhinitis    Chronic sinusitis Thought vasomotor / allergic components Failed Flonase, Atrovent Has seen ENT Start Azelastine Start Singulair      Relevant Medications   azelastine (ASTELIN) 0.1 % nasal spray  montelukast (SINGULAIR) 10 MG tablet   Benign hypertension with CKD (chronic kidney disease) stage III (HCC)    Well controlled HTN Complication: CKD-III Improved Cr  Plan: 1. Continue Lisinopril '5mg'$  daily 2. Improve hydration, avoid NSAIDs may use Tylenol and topical NSAID, lifestyle regular exercise, low sodium 3. Monitor BP outside office if he can occasionally 4. Follow-up 6 months      Drug-induced myopathy    Statin induced myopathy failed rosuvastatin, simvastatin.  On Zetia      Hyperlipidemia associated with type 2 diabetes mellitus ([place])    Controlled History of statin intolerance  Plan: 1. Continue Zetia 2. Encourage improved lifestyle - low carb/cholesterol, reduce portion size, continue improving regular exercise      Polyneuropathy associated with underlying disease (HCC)    Chronic peripheral polyneuropathy Likely secondary to DM Failed gabapentin, alpha lipoic acid  Goal to trial next med option with Lyrica as discussed Discontinue Gabapentin 600 TID, restart trial on Lyrica '25mg'$  BID dosing, inc to TID vs inc dose if indicated, gave him 30 day trial he can contact us if it is working or would like new dosage      Relevant Medications   pregabalin (LYRICA) 25 MG capsule   Primary osteoarthritis involving multiple joints   Type 2 diabetes mellitus with diabetic polyneuropathy ([place])    improved A1c to 6.5 on lifestyle overhaul Complications - CKD-III, peripheral neuropathy, other including hyperlipidemia specifically hypertriglyceridemia - increases risk of future cardiovascular complications    Plan:  1. Continue Metformin XR '500mg'$  2. Encourage improved lifestyle - low carb, low sugar diet, reduce portion size,  continue improving regular exercise 3. Continue ACEi 4. Continue Gabapentin - reduce dosage as advised 5. F/u with Dr Phoebe Sharps      Relevant Medications   pregabalin (LYRICA) 25 MG capsule   Other Relevant Orders   Urine Microalbumin w/creat. ratio   Other Visit Diagnoses     Annual physical exam    -  Primary   Needs flu shot       Relevant Orders   Flu Vaccine QUAD High Dose(Fluad) (Completed)       Updated Health Maintenance information Reviewed recent lab results with patient Encouraged improvement to lifestyle with diet and exercise   Chronic bilateral diabetic neuropathy in both feet, as complication of diabetes Evidence of callus formation both feet, heels and mid foot  Completed DM Foot Exam today in office, 02/27/22. See exam note.  Plan - Proceed with ordering Diabetic Shoes when order form is available from [facility] - Patient would benefit from Diabetic Shoes due to neuropathy with callus formation. Diabetes control is improving on current regimen, and I am continuing to monitor and manage diabetes.   Meds ordered this encounter  Medications   azelastine (ASTELIN) 0.1 % nasal spray    Sig: Place 2 sprays into both nostrils 2 (two) times daily. Use in each nostril as directed    Dispense:  30 mL    Refill:  12   montelukast (SINGULAIR) 10 MG tablet    Sig: Take 1 tablet (10 mg total) by mouth at bedtime.    Dispense:  90 tablet    Refill:  3   pregabalin (LYRICA) 25 MG capsule    Sig: Take 1 capsule (25 mg total) by mouth 2 (two) times daily.    Dispense:  60 capsule    Refill:  0    Discontinue Gabapentin. Start Lyrica      Follow up  plan: Return in about 4 months (around 06/30/2022) for 4 month follow up DM A1c Neuropathy med.  Nobie Putnam, [facility] 02/27/2022, 9:34 AM

## 2022-02-27 NOTE — Assessment & Plan Note (Signed)
Statin induced myopathy failed rosuvastatin, simvastatin.  On Zetia

## 2022-02-27 NOTE — Assessment & Plan Note (Addendum)
Chronic peripheral polyneuropathy Likely secondary to DM Failed gabapentin, alpha lipoic acid  Goal to trial next med option with Lyrica as discussed Discontinue Gabapentin 600 TID, restart trial on Lyrica '25mg'$  BID dosing, inc to TID vs inc dose if indicated, gave him 30 day trial he can contact us if it is working or would like new dosage

## 2022-02-27 NOTE — Assessment & Plan Note (Signed)
Chronic sinusitis Thought vasomotor / allergic components Failed Flonase, Atrovent Has seen ENT Start Azelastine Start Singulair

## 2022-02-27 NOTE — Assessment & Plan Note (Signed)
improved A1c to 6.5 on lifestyle overhaul Complications - CKD-III, peripheral neuropathy, other including hyperlipidemia specifically hypertriglyceridemia - increases risk of future cardiovascular complications    Plan:  1. Continue Metformin XR '500mg'$  2. Encourage improved lifestyle - low carb, low sugar diet, reduce portion size, continue improving regular exercise 3. Continue ACEi 4. Continue Gabapentin - reduce dosage as advised 5. F/u with Dr Phoebe Sharps

## 2022-02-28 LAB — MICROALBUMIN / CREATININE URINE RATIO
Creatinine, Urine: 74 mg/dL (ref 20–320)
Microalb Creat Ratio: 8 mcg/mg creat (ref <30)
Microalb, Ur: 0.6 mg/dL

## 2022-03-20 DIAGNOSIS — H0288B Meibomian gland dysfunction left eye, upper and lower eyelids: Secondary | ICD-10-CM | POA: Diagnosis not present

## 2022-03-20 DIAGNOSIS — H5203 Hypermetropia, bilateral: Secondary | ICD-10-CM | POA: Diagnosis not present

## 2022-03-20 DIAGNOSIS — H35373 Puckering of macula, bilateral: Secondary | ICD-10-CM | POA: Diagnosis not present

## 2022-03-20 DIAGNOSIS — H2511 Age-related nuclear cataract, right eye: Secondary | ICD-10-CM | POA: Diagnosis not present

## 2022-03-20 DIAGNOSIS — H0288A Meibomian gland dysfunction right eye, upper and lower eyelids: Secondary | ICD-10-CM | POA: Diagnosis not present

## 2022-03-20 DIAGNOSIS — E119 Type 2 diabetes mellitus without complications: Secondary | ICD-10-CM | POA: Diagnosis not present

## 2022-03-20 LAB — HM DIABETES EYE EXAM

## 2022-03-26 ENCOUNTER — Ambulatory Visit: Payer: PPO

## 2022-03-29 ENCOUNTER — Ambulatory Visit: Payer: Self-pay | Admitting: *Deleted

## 2022-03-29 DIAGNOSIS — G63 Polyneuropathy in diseases classified elsewhere: Secondary | ICD-10-CM

## 2022-03-29 MED ORDER — PREGABALIN 50 MG PO CAPS: 50.0000 mg | ORAL_CAPSULE | Freq: Two times a day (BID) | ORAL | 2 refills | Status: DC

## 2022-03-29 NOTE — Telephone Encounter (Signed)
Per agent: "The patient has called to share that they're continuing to experience discomfort in their feet and legs   The patient has been previously prescribed pregabalin (LYRICA) 25 MG capsule [[id number]] and would like the medication increased (as previously discussed with their doctor)   The patient shares that the discomfort in their feet and legs has been unaffected by the medication   Please contact further when possible"     Pt states med is "Not helping much at all." States discussed dose increase at OV. Would like  RX sent to [facility]. No new symptoms "Just not helping."  Reason for Disposition  [1] Pharmacy calling with prescription question AND [2] triager unable to answer question  Answer Assessment - Initial Assessment Questions 1. NAME of MEDICINE: "What medicine(s) are you calling about?"     Lyrica 2. QUESTION: "What is your question?" (e.g., double dose of medicine, side effect)     Need to increase dose 3. PRESCRIBER: "Who prescribed the medicine?" Reason: if prescribed by specialist, call should be referred to that group.     PCP  Protocols used: Medication Question Call-A-AH

## 2022-03-29 NOTE — Telephone Encounter (Signed)
Dose increase Lyrica from 25 TWICE A DAY to '50mg'$  TWICE A DAY  New rx sent  Please notify patient of new dose increase and new rx.  Let him know that if it helps but does not last long enough I would order 1 pill 3 times a day instead of twice a day.  Or if it still is not strong enough we can again go up to '75mg'$  next  Nobie Putnam, [facility] 03/29/2022, 12:51 PM

## 2022-04-12 ENCOUNTER — Ambulatory Visit (INDEPENDENT_AMBULATORY_CARE_PROVIDER_SITE_OTHER): Payer: Medicare HMO

## 2022-04-12 VITALS — Ht 70.0 in | Wt 162.0 lb

## 2022-04-12 DIAGNOSIS — Z0000 Encounter for general adult medical examination without abnormal findings: Secondary | ICD-10-CM | POA: Diagnosis not present

## 2022-04-12 NOTE — Patient Instructions (Signed)
[name] , Thank you for taking time to come for your Medicare Wellness Visit. I appreciate your ongoing commitment to your health goals. Please review the following plan we discussed and let me know if I can assist you in the future.   Screening recommendations/referrals: Colonoscopy: aged out Recommended yearly ophthalmology/optometry visit for glaucoma screening and checkup Recommended yearly dental visit for hygiene and checkup  Vaccinations: Influenza vaccine: 02/27/22 Pneumococcal vaccine: 03/20/16 Tdap vaccine: 06/24/14 Shingles vaccine: Zostavax 09/24/13   Covid-19: 06/29/19, 07/27/19  Advanced directives: no  Conditions/risks identified: none  Next appointment: Follow up in one year for your annual wellness visit. 04/18/23 @ 9:15 am by phone  Preventive Care 86 Years and Older, Male Preventive care refers to lifestyle choices and visits with your health care provider that can promote health and wellness. What does preventive care include? A yearly physical exam. This is also called an annual well check. Dental exams once or twice a year. Routine eye exams. Ask your health care provider how often you should have your eyes checked. Personal lifestyle choices, including: Daily care of your teeth and gums. Regular physical activity. Eating a healthy diet. Avoiding tobacco and drug use. Limiting alcohol use. Practicing safe sex. Taking low doses of aspirin every day. Taking vitamin and mineral supplements as recommended by your health care provider. What happens during an annual well check? The services and screenings done by your health care provider during your annual well check will depend on your age, overall health, lifestyle risk factors, and family history of disease. Counseling  Your health care provider may ask you questions about your: Alcohol use. Tobacco use. Drug use. Emotional well-being. Home and relationship well-being. Sexual activity. Eating  habits. History of falls. Memory and ability to understand (cognition). Work and work Statistician. Screening  You may have the following tests or measurements: Height, weight, and BMI. Blood pressure. Lipid and cholesterol levels. These may be checked every 5 years, or more frequently if you are over 86 years old. Skin check. Lung cancer screening. You may have this screening every year starting at age 78 if you have a 30-pack-year history of smoking and currently smoke or have quit within the past 15 years. Fecal occult blood test (FOBT) of the stool. You may have this test every year starting at age 52. Flexible sigmoidoscopy or colonoscopy. You may have a sigmoidoscopy every 5 years or a colonoscopy every 10 years starting at age 22. Prostate cancer screening. Recommendations will vary depending on your family history and other risks. Hepatitis C blood test. Hepatitis B blood test. Sexually transmitted disease (STD) testing. Diabetes screening. This is done by checking your blood sugar (glucose) after you have not eaten for a while (fasting). You may have this done every 1-3 years. Abdominal aortic aneurysm (AAA) screening. You may need this if you are a current or former smoker. Osteoporosis. You may be screened starting at age 1 if you are at high risk. Talk with your health care provider about your test results, treatment options, and if necessary, the need for more tests. Vaccines  Your health care provider may recommend certain vaccines, such as: Influenza vaccine. This is recommended every year. Tetanus, diphtheria, and acellular pertussis (Tdap, Td) vaccine. You may need a Td booster every 10 years. Zoster vaccine. You may need this after age 8. Pneumococcal 13-valent conjugate (PCV13) vaccine. One dose is recommended after age 30. Pneumococcal polysaccharide (PPSV23) vaccine. One dose is recommended after age 66. Talk to your health  care provider about which screenings and  vaccines you need and how often you need them. This information is not intended to replace advice given to you by your health care provider. Make sure you discuss any questions you have with your health care provider. Document Released: 06/09/2015 Document Revised: 01/31/2016 Document Reviewed: 03/14/2015 Elsevier Interactive Patient Education  2017 [place] Prevention in the Home Falls can cause injuries. They can happen to people of all ages. There are many things you can do to make your home safe and to help prevent falls. What can I do on the outside of my home? Regularly fix the edges of walkways and driveways and fix any cracks. Remove anything that might make you trip as you walk through a door, such as a raised step or threshold. Trim any bushes or trees on the path to your home. Use bright outdoor lighting. Clear any walking paths of anything that might make someone trip, such as rocks or tools. Regularly check to see if handrails are loose or broken. Make sure that both sides of any steps have handrails. Any raised decks and porches should have guardrails on the edges. Have any leaves, snow, or ice cleared regularly. Use sand or salt on walking paths during winter. Clean up any spills in your garage right away. This includes oil or grease spills. What can I do in the bathroom? Use night lights. Install grab bars by the toilet and in the tub and shower. Do not use towel bars as grab bars. Use non-skid mats or decals in the tub or shower. If you need to sit down in the shower, use a plastic, non-slip stool. Keep the floor dry. Clean up any water that spills on the floor as soon as it happens. Remove soap buildup in the tub or shower regularly. Attach bath mats securely with double-sided non-slip rug tape. Do not have throw rugs and other things on the floor that can make you trip. What can I do in the bedroom? Use night lights. Make sure that you have a light by your  bed that is easy to reach. Do not use any sheets or blankets that are too big for your bed. They should not hang down onto the floor. Have a firm chair that has side arms. You can use this for support while you get dressed. Do not have throw rugs and other things on the floor that can make you trip. What can I do in the kitchen? Clean up any spills right away. Avoid walking on wet floors. Keep items that you use a lot in easy-to-reach places. If you need to reach something above you, use a strong step stool that has a grab bar. Keep electrical cords out of the way. Do not use floor polish or wax that makes floors slippery. If you must use wax, use non-skid floor wax. Do not have throw rugs and other things on the floor that can make you trip. What can I do with my stairs? Do not leave any items on the stairs. Make sure that there are handrails on both sides of the stairs and use them. Fix handrails that are broken or loose. Make sure that handrails are as long as the stairways. Check any carpeting to make sure that it is firmly attached to the stairs. Fix any carpet that is loose or worn. Avoid having throw rugs at the top or bottom of the stairs. If you do have throw rugs, attach them to  the floor with carpet tape. Make sure that you have a light switch at the top of the stairs and the bottom of the stairs. If you do not have them, ask someone to add them for you. What else can I do to help prevent falls? Wear shoes that: Do not have high heels. Have rubber bottoms. Are comfortable and fit you well. Are closed at the toe. Do not wear sandals. If you use a stepladder: Make sure that it is fully opened. Do not climb a closed stepladder. Make sure that both sides of the stepladder are locked into place. Ask someone to hold it for you, if possible. Clearly mark and make sure that you can see: Any grab bars or handrails. First and last steps. Where the edge of each step is. Use tools that  help you move around (mobility aids) if they are needed. These include: Canes. Walkers. Scooters. Crutches. Turn on the lights when you go into a dark area. Replace any light bulbs as soon as they burn out. Set up your furniture so you have a clear path. Avoid moving your furniture around. If any of your floors are uneven, fix them. If there are any pets around you, be aware of where they are. Review your medicines with your doctor. Some medicines can make you feel dizzy. This can increase your chance of falling. Ask your doctor what other things that you can do to help prevent falls. This information is not intended to replace advice given to you by your health care provider. Make sure you discuss any questions you have with your health care provider. Document Released: 03/09/2009 Document Revised: 10/19/2015 Document Reviewed: 06/17/2014 Elsevier Interactive Patient Education  2017 [employer].

## 2022-04-12 NOTE — Progress Notes (Signed)
Virtual Visit via Telephone Note  I connected with  [name] on 04/12/22 at 10:00 AM EST by telephone and verified that I am speaking with the correct person using two identifiers.  Location: Patient: home Provider: [facility] Persons participating in the virtual visit: [place]   I discussed the limitations, risks, security and privacy concerns of performing an evaluation and management service by telephone and the availability of in person appointments. The patient expressed understanding and agreed to proceed.  Interactive audio and video telecommunications were attempted between this nurse and patient, however failed, due to patient having technical difficulties OR patient did not have access to video capability.  We continued and completed visit with audio only.  Some vital signs may be absent or patient reported.   Dionisio David, LPN  Subjective:   [name] is a 88 y.o. male who presents for Medicare Annual/Subsequent preventive examination.  Review of Systems     Cardiac Risk Factors include: advanced age (>61mn, >>65women);male gender;diabetes mellitus;dyslipidemia     Objective:    Today's Vitals   04/12/22 0955  PainSc: 4    There is no height or weight on file to calculate BMI.     04/12/2022   10:01 AM 12/29/2018    9:36 AM 12/23/2017   10:05 AM 12/13/2016    5:07 PM 10/22/2016    1:58 PM 04/24/2015    7:07 AM  Advanced Directives  Does Patient Have a Medical Advance Directive? No Yes No No No No  Type of Advance Directive  Living will;Healthcare Power of Attorney      Copy of [place] Chart?  No - copy requested      Would patient like information on creating a medical advance directive? No - Patient declined  Yes (MAU/Ambulatory/Procedural Areas - Information given)  No - Patient declined No - patient declined information    Current Medications (verified) Outpatient Encounter Medications as of 04/12/2022   Medication Sig   azelastine (ASTELIN) 0.1 % nasal spray Place 2 sprays into both nostrils 2 (two) times daily. Use in each nostril as directed   colchicine 0.6 MG tablet If acute flare only - Day 1: take 2 pills first day, then repeat 1 pill within two hours. Day 2 - take 1 pill daily for up to max 7 days or stop when gout flare resolves.   diclofenac Sodium (VOLTAREN) 1 % GEL Apply 2 g topically 4 (four) times daily as needed (elbow bursitis).   fluticasone (FLONASE) 50 MCG/ACT nasal spray Place 2 sprays into both nostrils daily. Use for 4-6 weeks then stop and use seasonally or as needed.   lisinopril (ZESTRIL) 5 MG tablet Take 1 tablet (5 mg total) by mouth daily.   metFORMIN (GLUCOPHAGE-XR) 500 MG 24 hr tablet Take 1 tablet (500 mg total) by mouth daily with breakfast.   montelukast (SINGULAIR) 10 MG tablet Take 1 tablet (10 mg total) by mouth at bedtime.   pregabalin (LYRICA) 50 MG capsule Take 1 capsule (50 mg total) by mouth 2 (two) times daily.   Alpha Lipoic Acid 200 MG CAPS Take 200 mg by mouth 3 (three) times daily. (Patient not taking: Reported on 04/12/2022)   ezetimibe (ZETIA) 10 MG tablet Take 1 tablet (10 mg total) by mouth daily. (Patient not taking: Reported on 04/12/2022)   glucosamine-chondroitin 500-400 MG tablet Take 1 tablet by mouth 3 (three) times daily as needed.  (Patient not taking: Reported on 04/12/2022)  No facility-administered encounter medications on file as of 04/12/2022.    Allergies (verified) Patient has no known allergies.   History: Past Medical History:  Diagnosis Date   Diabetes mellitus without complication ([place])    Hyperlipidemia    Hypertension    Neuropathy    Prostate cancer [facility])    Past Surgical History:  Procedure Laterality Date   CATARACT EXTRACTION W/PHACO Left 04/24/2015   Procedure: CATARACT EXTRACTION PHACO AND INTRAOCULAR LENS PLACEMENT (IOC);  Surgeon: Estill Cotta, MD;  Location: [facility] ORS;  Service: Ophthalmology;   Laterality: Left;  [place] 01:12 AP% 25.7 CDE 28.30 fluid pack lot #[id number]   COLONOSCOPY     Family History  Problem Relation Age of Onset   Stroke Mother    Cancer Mother        colon cancer   COPD Father    COPD Brother    Hearing loss Son    Social History   Socioeconomic History   Marital status: Widowed    Spouse name: Not on file   Number of children: Not on file   Years of education: Not on file   Highest education level: High school graduate  Occupational History   Not on file  Tobacco Use   Smoking status: Former    Types: Cigarettes    Quit date: 05/27/1978    Years since quitting: 43.9   Smokeless tobacco: Never  Vaping Use   Vaping Use: Never used  Substance and Sexual Activity   Alcohol use: No    Alcohol/week: 0.0 standard drinks of alcohol   Drug use: No   Sexual activity: Not on file  Other Topics Concern   Not on file  Social History Narrative   Lives in retirement community    Social Determinants of Health   Financial Resource Strain: Low Risk  (04/12/2022)   Overall Financial Resource Strain (CARDIA)    Difficulty of Paying Living Expenses: Not hard at all  Food Insecurity: No Food Insecurity (04/12/2022)   Hunger Vital Sign    Worried About Running Out of Food in the Last Year: Never true    Agenda in the Last Year: Never true  Transportation Needs: No Transportation Needs (04/12/2022)   PRAPARE - Hydrologist (Medical): No    Lack of Transportation (Non-Medical): No  Physical Activity: Insufficiently Active (04/12/2022)   Exercise Vital Sign    Days of Exercise per Week: 2 days    Minutes of Exercise per Session: 20 min  Stress: No Stress Concern Present (04/12/2022)   [place]    Feeling of Stress : Only a little  Social Connections: Moderately Isolated (04/12/2022)   Social Connection and Isolation Panel [NHANES]    Frequency of  Communication with Friends and Family: Three times a week    Frequency of Social Gatherings with Friends and Family: Once a week    Attends Religious Services: More than 4 times per year    Active Member of [employer] or Organizations: No    Attends Archivist Meetings: Never    Marital Status: Widowed    Tobacco Counseling Counseling given: Not Answered   Clinical Intake:  Pre-visit preparation completed: Yes  Pain : 0-10 Pain Score: 4  Pain Location: Foot     Nutritional Risks: None Diabetes: Yes CBG done?: No Did pt. bring in CBG monitor from home?: No  How often do you need to have  someone help you when you read instructions, pamphlets, or other written materials from your doctor or pharmacy?: 1 - Never  Diabetic?yes Nutrition Risk Assessment:  Has the patient had any N/V/D within the last 2 months?  No  Does the patient have any non-healing wounds?  No  Has the patient had any unintentional weight loss or weight gain?  No   Diabetes:  Is the patient diabetic?  Yes  If diabetic, was a CBG obtained today?  No  Did the patient bring in their glucometer from home?  No  How often do you monitor your CBG's? occasionally.   Financial Strains and Diabetes Management:  Are you having any financial strains with the device, your supplies or your medication? No .  Does the patient want to be seen by Chronic Care Management for management of their diabetes?  No  Would the patient like to be referred to a Nutritionist or for Diabetic Management?  No   Diabetic Exams:  Diabetic Eye Exam: Completed 03/20/22. Pt has been advised about the importance in completing this exam.   Diabetic Foot Exam: Completed 02/27/22. Pt has been advised about the importance in completing this exam.   Interpreter Needed?: No  Information entered by :: Kirke Shaggy, LPN   Activities of Daily Living    04/12/2022   10:01 AM  In your present state of health, do you have any  difficulty performing the following activities:  Hearing? 1  Vision? 0  Difficulty concentrating or making decisions? 0  Walking or climbing stairs? 1  Dressing or bathing? 0  Doing errands, shopping? 0  Preparing Food and eating ? N  Using the Toilet? N  In the past six months, have you accidently leaked urine? N  Do you have problems with loss of bowel control? N  Managing your Medications? N  Managing your Finances? N  Housekeeping or managing your Housekeeping? N    Patient Care Team: Olin Hauser, DO as PCP - General (Family Medicine)  Indicate any recent Medical Services you may have received from other than [facility] providers in the past year (date may be approximate).     Assessment:   This is a routine wellness examination for [place].  Hearing/Vision screen Hearing Screening - Comments:: Wears aids Vision Screening - Comments:: Wears glasses- Dr.Woodard   Dietary issues and exercise activities discussed: Current Exercise Habits: Home exercise routine, Type of exercise: walking, Time (Minutes): 20, Frequency (Times/Week): 2, Weekly Exercise (Minutes/Week): 40, Intensity: Mild   Goals Addressed             This Visit's Progress    DIET - EAT MORE FRUITS AND VEGETABLES         Depression Screen    04/12/2022    9:59 AM 02/27/2022    9:10 AM 01/24/2021   10:35 AM 10/24/2020    2:05 PM 06/02/2020    8:18 AM 01/10/2020    2:04 PM 10/08/2019    2:20 PM  PHQ 2/9 Scores  PHQ - 2 Score 0 2 0 0 0 0 0  PHQ- 9 Score 0 2 0 0       Fall Risk    04/12/2022   10:01 AM 02/27/2022    9:10 AM 01/24/2021   10:35 AM 10/24/2020    2:04 PM 07/19/2020   10:45 AM  Fall Risk   Falls in the past year? 0 0 0 0 0  Number falls in past yr: 0 0 0 0 0  Injury with Fall? 0 0 0 0 0  Risk for fall due to : No Fall Risks No Fall Risks     Follow up Falls prevention discussed;Falls evaluation completed Falls evaluation completed Falls evaluation completed Falls evaluation  completed     FALL RISK PREVENTION PERTAINING TO THE HOME:  Any stairs in or around the home? No  If so, are there any without handrails? No  Home free of loose throw rugs in walkways, pet beds, electrical cords, etc? Yes  Adequate lighting in your home to reduce risk of falls? Yes   ASSISTIVE DEVICES UTILIZED TO PREVENT FALLS:  Life alert? No  Use of a cane, walker or w/c? No  Grab bars in the bathroom? No  Shower chair or bench in shower? No  Elevated toilet seat or a handicapped toilet? No    Cognitive Function:        04/12/2022   10:04 AM 12/29/2018    9:36 AM 12/23/2017   10:07 AM 10/22/2016    1:59 PM  6CIT Screen  What Year? 0 points 0 points 0 points 0 points  What month? 0 points 0 points 0 points 0 points  What time? 0 points 0 points 0 points 0 points  Count back from 20 0 points 0 points 0 points 0 points  Months in reverse 0 points 0 points 0 points 0 points  Repeat phrase 2 points 0 points 2 points 2 points  Total Score 2 points 0 points 2 points 2 points    Immunizations Immunization History  Administered Date(s) Administered   Fluad Quad(high Dose 65+) 02/27/2022   Influenza, High Dose Seasonal PF 03/20/2016   Influenza-Unspecified 03/23/2015, 03/11/2017, 02/26/2018   Moderna Sars-Covid-2 Vaccination 06/29/2019, 07/27/2019   Pneumococcal Conjugate-13 03/20/2016   Pneumococcal Polysaccharide-23 05/07/2013    TDAP status: Up to date  Flu Vaccine status: Up to date  Pneumococcal vaccine status: Up to date  Covid-19 vaccine status: Completed vaccines  Qualifies for Shingles Vaccine? Yes   Zostavax completed Yes   Shingrix Completed?: No.    Education has been provided regarding the importance of this vaccine. Patient has been advised to call insurance company to determine out of pocket expense if they have not yet received this vaccine. Advised may also receive vaccine at local pharmacy or Health Dept. Verbalized acceptance and  understanding.  Screening Tests Health Maintenance  Topic Date Due   Zoster Vaccines- Shingrix (1 of 2) Never done   COVID-19 Vaccine (3 - Moderna risk series) 08/24/2019   HEMOGLOBIN A1C  08/21/2022   FOOT EXAM  02/28/2023   OPHTHALMOLOGY EXAM  03/21/2023   Medicare Annual Wellness (AWV)  04/13/2023   Pneumonia Vaccine 26+ Years old  Completed   INFLUENZA VACCINE  Completed   HPV VACCINES  Aged Out    Health Maintenance  Health Maintenance Due  Topic Date Due   Zoster Vaccines- Shingrix (1 of 2) Never done   COVID-19 Vaccine (3 - Moderna risk series) 08/24/2019    Colorectal cancer screening: No longer required.   Lung Cancer Screening: (Low Dose CT Chest recommended if Age 42-80 years, 30 pack-year currently smoking OR have quit w/in 15years.) does qualify.   Additional Screening:  Hepatitis C Screening: does not qualify; Completed no  Vision Screening: Recommended annual ophthalmology exams for early detection of glaucoma and other disorders of the eye. Is the patient up to date with their annual eye exam?  Yes  Who is the provider or what is the  name of the office in which the patient attends annual eye exams? Dr.Woodard If pt is not established with a provider, would they like to be referred to a provider to establish care? No .   Dental Screening: Recommended annual dental exams for proper oral hygiene  Community Resource Referral / Chronic Care Management: CRR required this visit?  No   CCM required this visit?  No      Plan:     I have personally reviewed and noted the following in the patient's chart:   Medical and social history Use of alcohol, tobacco or illicit drugs  Current medications and supplements including opioid prescriptions. Patient is not currently taking opioid prescriptions. Functional ability and status Nutritional status Physical activity Advanced directives List of other physicians Hospitalizations, surgeries, and ER visits in  previous 12 months Vitals Screenings to include cognitive, depression, and falls Referrals and appointments  In addition, I have reviewed and discussed with patient certain preventive protocols, quality metrics, and best practice recommendations. A written personalized care plan for preventive services as well as general preventive health recommendations were provided to patient.     Dionisio David, LPN   28/31/5176   Nurse Notes: none

## 2022-05-01 ENCOUNTER — Telehealth: Payer: Self-pay

## 2022-05-01 DIAGNOSIS — G63 Polyneuropathy in diseases classified elsewhere: Secondary | ICD-10-CM

## 2022-05-01 DIAGNOSIS — E1142 Type 2 diabetes mellitus with diabetic polyneuropathy: Secondary | ICD-10-CM

## 2022-05-01 MED ORDER — PREGABALIN 75 MG PO CAPS: 75.0000 mg | ORAL_CAPSULE | Freq: Two times a day (BID) | ORAL | 1 refills | Status: DC

## 2022-05-01 NOTE — Telephone Encounter (Signed)
Copied from [place] [phone]. Topic: General - Inquiry >> May 01, 2022  8:36 AM Leilani Able wrote: Reason for CRM: pregabalin (LYRICA) 50 MG capsule re med, pt has called in after 30 days on increase  of med with additional refills available stating that may need to increase dosage again, and do for 90 days. Agent shared with pt would relay but since no appt since Oct may need to make appt for increase, pt denied appt and said he would try it another 30 days.

## 2022-05-01 NOTE — Telephone Encounter (Signed)
Called patient. Confirmed the Lyrica '50mg'$  twice a day is not effective, he prefers dose increase, new rx sent '75mg'$  TWICE A DAY dosing he prefers 90 day supply actually, sent rx now  Nobie Putnam, [facility] 05/01/2022, 4:59 PM

## 2022-05-31 ENCOUNTER — Other Ambulatory Visit: Payer: Self-pay | Admitting: Family Medicine

## 2022-05-31 ENCOUNTER — Telehealth: Payer: Self-pay

## 2022-05-31 DIAGNOSIS — I129 Hypertensive chronic kidney disease with stage 1 through stage 4 chronic kidney disease, or unspecified chronic kidney disease: Secondary | ICD-10-CM

## 2022-05-31 DIAGNOSIS — E1142 Type 2 diabetes mellitus with diabetic polyneuropathy: Secondary | ICD-10-CM

## 2022-05-31 NOTE — Telephone Encounter (Signed)
Handwritten rx pad was written and ready to be faxed to [facility] location as he requests.  Usually handwritten rx is not sufficient, they will need to send [place] an order form that we will then fill out next and send with records.  Nobie Putnam, [facility] 05/31/2022, 6:17 PM

## 2022-05-31 NOTE — Telephone Encounter (Signed)
Copied from [place] [id number]. Topic: General - Other >> May 31, 2022 12:52 PM Chapman Fitch wrote: [facility] has pulled out of pts insurance network / so he needs RX for diabetic shoes resent to another location / pt would like this sent to [facility] on [address] / phone: [phone] fax# [phone]/[id number] advise when sent

## 2022-05-31 NOTE — Telephone Encounter (Signed)
Requested Prescriptions  Pending Prescriptions Disp Refills   lisinopril (ZESTRIL) 5 MG tablet [Pharmacy Med Name: LISINOPRIL 5 MG TABLET] 90 tablet 0    Sig: Take 1 tablet (5 mg total) by mouth daily.     Cardiovascular:  ACE Inhibitors Passed - 05/31/2022  8:45 AM      Passed - Cr in normal range and within 180 days    Creat  Date Value Ref Range Status  02/20/2022 1.21 0.70 - 1.22 mg/dL Final   Creatinine, Urine  Date Value Ref Range Status  02/27/2022 74 20 - 320 mg/dL Final         Passed - K in normal range and within 180 days    Potassium  Date Value Ref Range Status  02/20/2022 4.8 3.5 - 5.3 mmol/L Final         Passed - Patient is not pregnant      Passed - Last BP in normal range    BP Readings from Last 1 Encounters:  02/27/22 116/61         Passed - Valid encounter within last 6 months    Recent Outpatient Visits           3 months ago Annual physical exam   [place], DO   4 months ago Olecranon bursitis of left elbow   [place], DO   4 months ago Olecranon bursitis of left elbow   [facility] [place], Coralie Keens, NP   1 year ago Acute non-recurrent frontal sinusitis   [place], DO   1 year ago Acute non-recurrent frontal sinusitis   [facility] [place], Devonne Doughty, [place]

## 2022-07-24 ENCOUNTER — Ambulatory Visit: Payer: Medicare HMO | Admitting: Family Medicine

## 2022-07-26 ENCOUNTER — Ambulatory Visit: Payer: Medicare HMO | Admitting: Family Medicine

## 2022-07-26 ENCOUNTER — Ambulatory Visit (INDEPENDENT_AMBULATORY_CARE_PROVIDER_SITE_OTHER): Payer: Medicare HMO | Admitting: Family Medicine

## 2022-07-26 ENCOUNTER — Encounter: Payer: Self-pay | Admitting: Family Medicine

## 2022-07-26 ENCOUNTER — Other Ambulatory Visit: Payer: Self-pay | Admitting: Family Medicine

## 2022-07-26 VITALS — BP 104/64 | HR 79 | Temp 96.9°F | Ht 70.0 in | Wt 167.0 lb

## 2022-07-26 DIAGNOSIS — E538 Deficiency of other specified B group vitamins: Secondary | ICD-10-CM

## 2022-07-26 DIAGNOSIS — R351 Nocturia: Secondary | ICD-10-CM

## 2022-07-26 DIAGNOSIS — N183 Chronic kidney disease, stage 3 unspecified: Secondary | ICD-10-CM

## 2022-07-26 DIAGNOSIS — G63 Polyneuropathy in diseases classified elsewhere: Secondary | ICD-10-CM

## 2022-07-26 DIAGNOSIS — E1142 Type 2 diabetes mellitus with diabetic polyneuropathy: Secondary | ICD-10-CM

## 2022-07-26 DIAGNOSIS — I129 Hypertensive chronic kidney disease with stage 1 through stage 4 chronic kidney disease, or unspecified chronic kidney disease: Secondary | ICD-10-CM

## 2022-07-26 DIAGNOSIS — E1169 Type 2 diabetes mellitus with other specified complication: Secondary | ICD-10-CM

## 2022-07-26 LAB — POCT GLYCOSYLATED HEMOGLOBIN (HGB A1C): Hemoglobin A1C: 7.6 % — AB (ref 4.0–5.6)

## 2022-07-26 NOTE — Patient Instructions (Addendum)
Thank you for coming to the office today.  Currently on Pregabalin Lyrica '75mg'$  twice a day  Dose increase to Lyrica '75mg'$  x 2 = '150mg'$  in evening and keep '75mg'$  in AM  After 1-2 weeks, you can go up to Lyrica '75mg'$  x 2 = '150mg'$  TWICE A DAY  Let me know if this is working any better, if you prefer 1 in AM and 2 PM or if you prefer 2 twice a day.   Diabetic Foot Exam update today  I will fax your form and patient notes next week to [facility] so they can keep it on file for April to get your new diabetic shoes.  A1c today, pending result.   DUE for FASTING BLOOD WORK (no food or drink after midnight before the lab appointment, only water or coffee without cream/sugar on the morning of)  SCHEDULE "Lab Only" visit in the morning at the clinic for lab draw in 7 MONTHS   - Make sure Lab Only appointment is at about 1 week before your next appointment, so that results will be available  For Lab Results, once available within 2-3 days of blood draw, you can can log in to MyChart online to view your results and a brief explanation. Also, we can discuss results at next follow-up visit.    Please schedule a Follow-up Appointment to: Return in about 7 months (around 02/25/2023) for 7 month fasting lab only then 1 week later Annual Physical.  If you have any other questions or concerns, please feel free to call the office or send a message through [facility]. You may also schedule an earlier appointment if necessary.  Additionally, you may be receiving a survey about your experience at our office within a few days to 1 week by e-mail or mail. We value your feedback.  Nobie Putnam, DO [place]

## 2022-07-26 NOTE — Assessment & Plan Note (Signed)
Well controlled HTN Complication: CKD-III Improved Cr  Plan: 1. Continue Lisinopril '5mg'$  daily 2. Improve hydration, avoid NSAIDs may use Tylenol and topical NSAID, lifestyle regular exercise, low sodium 3. Monitor BP outside office if he can occasionally 4. Follow-up 6 months

## 2022-07-26 NOTE — Progress Notes (Signed)
Subjective:    Patient ID: [name], male    DOB: [birth year], 89 y.o.   MRN: [id number]  [name] is a 89 y.o. male presenting on 07/26/2022 for Diabetes   HPI  Type 2 Diabetes with Neuropathy Hyperlipidemia Hypertension  Here today for A1c check and Diabetic Foot exam. He needs new order DM Shoes.  Diet improvement similar to Keto diet. No sugars / grains / dairy. He uses almond milk He is on Metformin XR '500mg'$  daily   On Zetia   On Lisinopril low dose '5mg'$ .   He has seen Vascular specialist in [place] and they were unable to help him restore circulation in lower extremity. He has some history of circulation and neuropathy He has poor balance   Update interval since last visit. He has been discontinued off Gabapentin He has tried Lyrica '50mg'$  then increased to Lyrica '75mg'$  twice a day.  Chiropractor has new treatment for feet.      07/26/2022    2:39 PM 04/12/2022    9:59 AM 02/27/2022    9:10 AM  Depression screen PHQ 2/9  Decreased Interest 0 0 1  Down, Depressed, Hopeless 0 0 1  PHQ - 2 Score 0 0 2  Altered sleeping 0 0 0  Tired, decreased energy 1 0 0  Change in appetite 0 0 0  Feeling bad or failure about yourself  0 0 0  Trouble concentrating 0 0 0  Moving slowly or fidgety/restless 0 0 0  Suicidal thoughts 0 0 0  PHQ-9 Score 1 0 2  Difficult doing work/chores Not difficult at all Not difficult at all Not difficult at all    Social History   Tobacco Use   Smoking status: Former    Types: Cigarettes    Quit date: 05/27/1978    Years since quitting: 44.1   Smokeless tobacco: Never  Vaping Use   Vaping Use: Never used  Substance Use Topics   Alcohol use: No    Alcohol/week: 0.0 standard drinks of alcohol   Drug use: No    Review of Systems Per HPI unless specifically indicated above     Objective:    BP 104/64 (BP Location: Right Arm, Patient Position: Sitting, Cuff Size: Normal)   Pulse 79   Temp (!) 96.9 F (36.1 C)  (Temporal)   Wt 167 lb (75.8 kg)   SpO2 99%   BMI 23.96 kg/m   Wt Readings from Last 3 Encounters:  07/26/22 167 lb (75.8 kg)  04/12/22 162 lb (73.5 kg)  02/27/22 162 lb 3.2 oz (73.6 kg)    Physical Exam Vitals and nursing note reviewed.  Constitutional:      General: He is not in acute distress.    Appearance: He is well-developed. He is not diaphoretic.     Comments: Well-appearing, comfortable, cooperative  HENT:     Head: Normocephalic and atraumatic.  Eyes:     General:        Right eye: No discharge.        Left eye: No discharge.     Conjunctiva/sclera: Conjunctivae normal.  Neck:     Thyroid: No thyromegaly.  Cardiovascular:     Rate and Rhythm: Normal rate and regular rhythm.     Pulses: Normal pulses.     Heart sounds: Normal heart sounds. No murmur heard. Pulmonary:     Effort: Pulmonary effort is normal. No respiratory distress.     Breath sounds: Normal breath sounds. No wheezing  or rales.  Musculoskeletal:        General: Normal range of motion.     Cervical back: Normal range of motion and neck supple.  Lymphadenopathy:     Cervical: No cervical adenopathy.  Skin:    General: Skin is warm and dry.     Findings: No erythema or rash.  Neurological:     Mental Status: He is alert and oriented to person, place, and time. Mental status is at baseline.  Psychiatric:        Behavior: Behavior normal.     Comments: Well groomed, good eye contact, normal speech and thoughts     Recent Labs    02/20/22 0833 07/26/22 1805  HGBA1C 6.5* 7.6*     Diabetic Foot Exam - Simple   Simple Foot Form Diabetic Foot exam was performed with the following findings: Yes 07/26/2022  3:11 PM  Visual Inspection See comments: Yes Sensation Testing See comments: Yes Pulse Check Posterior Tibialis and Dorsalis pulse intact bilaterally: Yes Comments Reduced sensation to monofilament bilateral. Forefoot and heel callus formation without ulceration.      Results for  orders placed or performed in visit on 07/26/22  POCT glycosylated hemoglobin (Hb A1C)  Result Value Ref Range   Hemoglobin A1C 7.6 (A) 4.0 - 5.6 %      Assessment & Plan:   Problem List Items Addressed This Visit     Benign hypertension with CKD (chronic kidney disease) stage III ([place])    Well controlled HTN Complication: CKD-III Improved Cr  Plan: 1. Continue Lisinopril '5mg'$  daily 2. Improve hydration, avoid NSAIDs may use Tylenol and topical NSAID, lifestyle regular exercise, low sodium 3. Monitor BP outside office if he can occasionally 4. Follow-up 6 months      Polyneuropathy associated with underlying disease ([place])   Type 2 diabetes mellitus with diabetic polyneuropathy (HCC) - Primary    A1c elevated to 7.6 Complications - CKD-III, peripheral neuropathy, other including hyperlipidemia specifically hypertriglyceridemia - increases risk of future cardiovascular complications    Plan:  1. Continue Metformin XR '500mg'$  2. Encourage improved lifestyle - low carb, low sugar diet, reduce portion size, continue improving regular exercise 3. Continue ACEi On Lyrica for neuropathy, off Gabapentin. - Dose increase Lyrica trial '75mg'$ , x 1 in AM and x 2 in PM, if successful can continue or can try 75 x 2 = '150mg'$  TWICE A DAY we can re order if/when need.      Relevant Orders   POCT glycosylated hemoglobin (Hb A1C) (Completed)    No orders of the defined types were placed in this encounter.  Chronic bilateral diabetic neuropathy in both feet, as complication of diabetes Evidence of callus formation both feet, heels and mid foot   Completed DM Foot Exam today in office, 07/26/22. See exam note.   Plan - Proceed with ordering Diabetic Shoes when order form is available from [facility] - Patient would benefit from Diabetic Shoes due to neuropathy with callus formation. Diabetes control is improving on current regimen, and I am continuing to monitor and manage  diabetes.   Follow up plan: Return in about 7 months (around 02/25/2023) for 7 month fasting lab only then 1 week later Annual Physical.  Future labs 92/2024   Nobie Putnam, [facility] 07/26/2022, 3:03 PM

## 2022-07-26 NOTE — Assessment & Plan Note (Signed)
A1c elevated to 7.6 Complications - CKD-III, peripheral neuropathy, other including hyperlipidemia specifically hypertriglyceridemia - increases risk of future cardiovascular complications    Plan:  1. Continue Metformin XR '500mg'$  2. Encourage improved lifestyle - low carb, low sugar diet, reduce portion size, continue improving regular exercise 3. Continue ACEi On Lyrica for neuropathy, off Gabapentin. - Dose increase Lyrica trial '75mg'$ , x 1 in AM and x 2 in PM, if successful can continue or can try 75 x 2 = '150mg'$  TWICE A DAY we can re order if/when need.

## 2022-08-28 DIAGNOSIS — E1142 Type 2 diabetes mellitus with diabetic polyneuropathy: Secondary | ICD-10-CM | POA: Diagnosis not present

## 2022-08-31 ENCOUNTER — Other Ambulatory Visit: Payer: Self-pay | Admitting: Family Medicine

## 2022-08-31 DIAGNOSIS — E1142 Type 2 diabetes mellitus with diabetic polyneuropathy: Secondary | ICD-10-CM

## 2022-08-31 DIAGNOSIS — N183 Chronic kidney disease, stage 3 unspecified: Secondary | ICD-10-CM

## 2022-09-02 NOTE — Telephone Encounter (Signed)
Requested Prescriptions  Pending Prescriptions Disp Refills   lisinopril (ZESTRIL) 5 MG tablet [Pharmacy Med Name: LISINOPRIL 5 MG TABLET] 90 tablet 1    Sig: Take 1 tablet (5 mg total) by mouth daily.     Cardiovascular:  ACE Inhibitors Failed - 08/31/2022 10:58 AM      Failed - Cr in normal range and within 180 days    Creat  Date Value Ref Range Status  02/20/2022 1.21 0.70 - 1.22 mg/dL Final   Creatinine, Urine  Date Value Ref Range Status  02/27/2022 74 20 - 320 mg/dL Final         Failed - K in normal range and within 180 days    Potassium  Date Value Ref Range Status  02/20/2022 4.8 3.5 - 5.3 mmol/L Final         Passed - Patient is not pregnant      Passed - Last BP in normal range    BP Readings from Last 1 Encounters:  07/26/22 104/64         Passed - Valid encounter within last 6 months    Recent Outpatient Visits           1 month ago Type 2 diabetes mellitus with diabetic polyneuropathy, without long-term current use of insulin [facility])   [place] [facility] [place], Netta Neat, DO   6 months ago Annual physical exam   [place] [facility] [name], DO   7 months ago Olecranon bursitis of left elbow   [place] [facility] [place], Netta Neat, DO   7 months ago Olecranon bursitis of left elbow   [place] [facility] [place], Salvadore [place], NP   1 year ago Acute non-recurrent frontal sinusitis   [place] [facility] Althe[place] Charon, Netta Neat, DO       Future Appointments             In 5 months Althea Charon, Netta Neat, DO  [facility], [facility]

## 2022-09-16 ENCOUNTER — Other Ambulatory Visit: Payer: Self-pay | Admitting: Family Medicine

## 2022-09-16 DIAGNOSIS — E1142 Type 2 diabetes mellitus with diabetic polyneuropathy: Secondary | ICD-10-CM

## 2022-09-17 NOTE — Telephone Encounter (Signed)
Requested Prescriptions  Pending Prescriptions Disp Refills   metFORMIN (GLUCOPHAGE-XR) 500 MG 24 hr tablet [Pharmacy Med Name: METFORMIN HCL ER 500 MG TABLET] 90 tablet 0    Sig: Take 1 tablet (500 mg total) by mouth daily with breakfast.     Endocrinology:  Diabetes - Biguanides Failed - 09/16/2022  9:08 AM      Failed - eGFR in normal range and within 360 days    GFR, Est African American  Date Value Ref Range Status  12/31/2019 66 > OR = 60 mL/min/1.45m2 Final   GFR, Est Non African American  Date Value Ref Range Status  12/31/2019 57 (L) > OR = 60 mL/min/1.49m2 Final   eGFR  Date Value Ref Range Status  01/10/2021 61 > OR = 60 mL/min/1.69m2 Final    Comment:    The eGFR is based on the CKD-EPI 2021 equation. To calculate  the new eGFR from a previous Creatinine or Cystatin C result, go to https://www.kidney.org/professionals/ kdoqi/gfr%5Fcalculator          Failed - B12 Level in normal range and within 720 days    No results found for: "VITAMINB12"       Passed - Cr in normal range and within 360 days    Creat  Date Value Ref Range Status  02/20/2022 1.21 0.70 - 1.22 mg/dL Final   Creatinine, Urine  Date Value Ref Range Status  02/27/2022 74 20 - 320 mg/dL Final         Passed - HBA1C is between 0 and 7.9 and within 180 days    Hemoglobin A1C  Date Value Ref Range Status  07/26/2022 7.6 (A) 4.0 - 5.6 % Final   Hgb A1c MFr Bld  Date Value Ref Range Status  02/20/2022 6.5 (H) <5.7 % of total Hgb Final    Comment:    For someone without known diabetes, a hemoglobin A1c value of 6.5% or greater indicates that they may have  diabetes and this should be confirmed with a follow-up  test. . For someone with known diabetes, a value <7% indicates  that their diabetes is well controlled and a value  greater than or equal to 7% indicates suboptimal  control. A1c targets should be individualized based on  duration of diabetes, age, comorbid conditions, and  other  considerations. . Currently, no consensus exists regarding use of hemoglobin A1c for diagnosis of diabetes for children. Verna Czech - Valid encounter within last 6 months    Recent Outpatient Visits           1 month ago Type 2 diabetes mellitus with diabetic polyneuropathy, without long-term current use of insulin [facility])   [place] [facility] [place], Netta Neat, DO   6 months ago Annual physical exam   [place] [facility] [name], DO   7 months ago Olecranon bursitis of left elbow   [facility] [name], DO   8 months ago Olecranon bursitis of left elbow   [place] [facility] [place], Salvadore [place], NP   1 year ago Acute non-recurrent frontal sinusitis   [place] [facility] [name], DO       Future Appointments             In 5 months Althea Charon, Netta Neat, DO [place] [facility], [facility]  Passed - CBC within normal limits and completed in the last 12 months    WBC  Date Value Ref Range Status  02/20/2022 7.3 3.8 - 10.8 Thousand/uL Final   RBC  Date Value Ref Range Status  02/20/2022 5.76 4.20 - 5.80 Million/uL Final   Hemoglobin  Date Value Ref Range Status  02/20/2022 17.2 (H) 13.2 - 17.1 g/dL Final  16/02/9603 54.0 12.6 - 17.7 g/dL Final   HCT  Date Value Ref Range Status  02/20/2022 50.3 (H) 38.5 - 50.0 % Final   Hematocrit  Date Value Ref Range Status  09/04/2015 47.6 37.5 - 51.0 % Final   MCHC  Date Value Ref Range Status  02/20/2022 34.2 32.0 - 36.0 g/dL Final   [facility]  Date Value Ref Range Status  02/20/2022 29.9 27.0 - 33.0 pg Final   MCV  Date Value Ref Range Status  02/20/2022 87.3 80.0 - 100.0 fL Final  09/04/2015 85 79 - 97 fL Final   No results found for: "PLTCOUNTKUC", "LABPLAT", "POCPLA" RDW  Date Value Ref Range Status  02/20/2022 13.1 11.0 -  15.0 % Final  09/04/2015 13.6 12.3 - 15.4 % Final

## 2022-10-28 ENCOUNTER — Other Ambulatory Visit: Payer: Self-pay | Admitting: Family Medicine

## 2022-10-28 DIAGNOSIS — E1142 Type 2 diabetes mellitus with diabetic polyneuropathy: Secondary | ICD-10-CM

## 2022-10-28 DIAGNOSIS — G63 Polyneuropathy in diseases classified elsewhere: Secondary | ICD-10-CM

## 2022-10-28 NOTE — Telephone Encounter (Signed)
Requested medication (s) are due for refill today: yes  Requested medication (s) are on the active medication list: yes  Last refill:  05/01/22 #180/1  Future visit scheduled: yes  Notes to clinic:  Unable to refill per protocol, cannot delegate.    Requested Prescriptions  Pending Prescriptions Disp Refills   pregabalin (LYRICA) 75 MG capsule [Pharmacy Med Name: PREGABALIN75 MG CAPSULE] 180 capsule 0    Sig: Take 1 capsule (75 mg total) by mouth 2 (two) times daily.     Not Delegated - Neurology:  Anticonvulsants - Controlled - pregabalin Failed - 10/28/2022  9:19 AM      Failed - This refill cannot be delegated      Passed - Cr in normal range and within 360 days    Creat  Date Value Ref Range Status  02/20/2022 1.21 0.70 - 1.22 mg/dL Final   Creatinine, Urine  Date Value Ref Range Status  02/27/2022 74 20 - 320 mg/dL Final         Passed - Completed PHQ-2 or PHQ-9 in the last 360 days      Passed - Valid encounter within last 12 months    Recent Outpatient Visits           3 months ago Type 2 diabetes mellitus with diabetic polyneuropathy, without long-term current use of insulin [facility])   [place] [facility] [place], Netta Neat, DO   8 months ago Annual physical exam   [place] [facility] [name], DO   9 months ago Olecranon bursitis of left elbow   [place] [facility] [name], DO   9 months ago Olecranon bursitis of left elbow   [place] [facility] [place], Salvadore [place], NP   1 year ago Acute non-recurrent frontal sinusitis   [place] [facility] [place] Althea Charon, Netta Neat, DO       Future Appointments             In 4 months Althea Charon, Netta Neat, DO [place] [facility], [facility]

## 2022-12-16 ENCOUNTER — Other Ambulatory Visit: Payer: Self-pay | Admitting: Family Medicine

## 2022-12-16 DIAGNOSIS — E1142 Type 2 diabetes mellitus with diabetic polyneuropathy: Secondary | ICD-10-CM

## 2022-12-17 NOTE — Telephone Encounter (Signed)
Requested Prescriptions  Pending Prescriptions Disp Refills   metFORMIN (GLUCOPHAGE-XR) 500 MG 24 hr tablet [Pharmacy Med Name: METFORMIN HCL ER 500 MG TABLET] 90 tablet 0    Sig: Take 1 tablet (500 mg total) by mouth daily with breakfast.     Endocrinology:  Diabetes - Biguanides Failed - 12/16/2022  9:57 AM      Failed - eGFR in normal range and within 360 days    GFR, Est African American  Date Value Ref Range Status  12/31/2019 66 > OR = 60 mL/min/1.50m2 Final   GFR, Est Non African American  Date Value Ref Range Status  12/31/2019 57 (L) > OR = 60 mL/min/1.73m2 Final   eGFR  Date Value Ref Range Status  01/10/2021 61 > OR = 60 mL/min/1.3m2 Final    Comment:    The eGFR is based on the CKD-EPI 2021 equation. To calculate  the new eGFR from a previous Creatinine or Cystatin C result, go to https://www.kidney.org/professionals/ kdoqi/gfr%5Fcalculator          Failed - B12 Level in normal range and within 720 days    No results found for: "VITAMINB12"       Passed - Cr in normal range and within 360 days    Creat  Date Value Ref Range Status  02/20/2022 1.21 0.70 - 1.22 mg/dL Final   Creatinine, Urine  Date Value Ref Range Status  02/27/2022 74 20 - 320 mg/dL Final         Passed - HBA1C is between 0 and 7.9 and within 180 days    Hemoglobin A1C  Date Value Ref Range Status  07/26/2022 7.6 (A) 4.0 - 5.6 % Final   Hgb A1c MFr Bld  Date Value Ref Range Status  02/20/2022 6.5 (H) <5.7 % of total Hgb Final    Comment:    For someone without known diabetes, a hemoglobin A1c value of 6.5% or greater indicates that they may have  diabetes and this should be confirmed with a follow-up  test. . For someone with known diabetes, a value <7% indicates  that their diabetes is well controlled and a value  greater than or equal to 7% indicates suboptimal  control. A1c targets should be individualized based on  duration of diabetes, age, comorbid conditions, and  other  considerations. . Currently, no consensus exists regarding use of hemoglobin A1c for diagnosis of diabetes for children. Verna Czech - Valid encounter within last 6 months    Recent Outpatient Visits           4 months ago Type 2 diabetes mellitus with diabetic polyneuropathy, without long-term current use of insulin [facility])   [place] [facility] [name], DO   9 months ago Annual physical exam   [place] [facility] [name], DO   10 months ago Olecranon bursitis of left elbow   [place] [facility] [name], DO   11 months ago Olecranon bursitis of left elbow   [place] [facility] [place], Salvadore Oxford, NP   1 year ago Acute non-recurrent frontal sinusitis   [place] [facility] [name], DO       Future Appointments             In 2 months Althea Charon, Netta Neat, DO  [facility], [facility]  Passed - CBC within normal limits and completed in the last 12 months    WBC  Date Value Ref Range Status  02/20/2022 7.3 3.8 - 10.8 Thousand/uL Final   RBC  Date Value Ref Range Status  02/20/2022 5.76 4.20 - 5.80 Million/uL Final   Hemoglobin  Date Value Ref Range Status  02/20/2022 17.2 (H) 13.2 - 17.1 g/dL Final  16/02/9603 54.0 12.6 - 17.7 g/dL Final   HCT  Date Value Ref Range Status  02/20/2022 50.3 (H) 38.5 - 50.0 % Final   Hematocrit  Date Value Ref Range Status  09/04/2015 47.6 37.5 - 51.0 % Final   MCHC  Date Value Ref Range Status  02/20/2022 34.2 32.0 - 36.0 g/dL Final   [facility]  Date Value Ref Range Status  02/20/2022 29.9 27.0 - 33.0 pg Final   MCV  Date Value Ref Range Status  02/20/2022 87.3 80.0 - 100.0 fL Final  09/04/2015 85 79 - 97 fL Final   No results found for: "PLTCOUNTKUC", "LABPLAT", "POCPLA" RDW  Date Value Ref Range Status  02/20/2022 13.1  11.0 - 15.0 % Final  09/04/2015 13.6 12.3 - 15.4 % Final

## 2023-01-28 ENCOUNTER — Ambulatory Visit: Payer: Medicare HMO | Admitting: Podiatry

## 2023-01-28 DIAGNOSIS — M79674 Pain in right toe(s): Secondary | ICD-10-CM

## 2023-01-28 DIAGNOSIS — M79675 Pain in left toe(s): Secondary | ICD-10-CM

## 2023-01-28 DIAGNOSIS — B351 Tinea unguium: Secondary | ICD-10-CM | POA: Diagnosis not present

## 2023-01-28 NOTE — Progress Notes (Signed)
  Subjective:  Patient ID: [name], male    DOB: [birth year],  MRN: [id number]  Chief Complaint  Patient presents with   Diabetes    Nail trim    89 y.o. male returns for the above complaint.  Patient presents with thickened onychodystrophy mycotic toenails x 10 mild pain on palpation worse with ambulation worse with pressure he would like for me debride down is not able to do it himself.  Objective:  There were no vitals filed for this visit. Podiatric Exam: Vascular: dorsalis pedis and posterior tibial pulses are palpable bilateral. Capillary return is immediate. Temperature gradient is WNL. Skin turgor WNL  Sensorium: Normal Semmes Weinstein monofilament test. Normal tactile sensation bilaterally. Nail Exam: Pt has thick disfigured discolored nails with subungual debris noted bilateral entire nail hallux through fifth toenails.  Pain on palpation to the nails. Ulcer Exam: There is no evidence of ulcer or pre-ulcerative changes or infection. Orthopedic Exam: Muscle tone and strength are WNL. No limitations in general ROM. No crepitus or effusions noted.  Skin: No Porokeratosis. No infection or ulcers    Assessment & Plan:   1. Pain due to onychomycosis of toenails of both feet     Patient was evaluated and treated and all questions answered.  Onychomycosis with pain  -Nails palliatively debrided as below. -Educated on self-care  Procedure: Nail Debridement Rationale: pain  Type of Debridement: manual, sharp debridement. Instrumentation: Nail nipper, rotary burr. Number of Nails: 10  Procedures and Treatment: Consent by patient was obtained for treatment procedures. The patient understood the discussion of treatment and procedures well. All questions were answered thoroughly reviewed. Debridement of mycotic and hypertrophic toenails, 1 through 5 bilateral and clearing of subungual debris. No ulceration, no infection noted.  Return Visit-Office Procedure: Patient  instructed to return to the office for a follow up visit 3 months for continued evaluation and treatment.  Nicholes Rough, DPM    No follow-ups on file.

## 2023-02-19 ENCOUNTER — Other Ambulatory Visit: Payer: Medicare HMO

## 2023-02-19 DIAGNOSIS — I129 Hypertensive chronic kidney disease with stage 1 through stage 4 chronic kidney disease, or unspecified chronic kidney disease: Secondary | ICD-10-CM | POA: Diagnosis not present

## 2023-02-19 DIAGNOSIS — E1142 Type 2 diabetes mellitus with diabetic polyneuropathy: Secondary | ICD-10-CM

## 2023-02-19 DIAGNOSIS — N183 Chronic kidney disease, stage 3 unspecified: Secondary | ICD-10-CM | POA: Diagnosis not present

## 2023-02-19 DIAGNOSIS — E785 Hyperlipidemia, unspecified: Secondary | ICD-10-CM | POA: Diagnosis not present

## 2023-02-19 DIAGNOSIS — R351 Nocturia: Secondary | ICD-10-CM

## 2023-02-19 DIAGNOSIS — G63 Polyneuropathy in diseases classified elsewhere: Secondary | ICD-10-CM | POA: Diagnosis not present

## 2023-02-19 DIAGNOSIS — E1169 Type 2 diabetes mellitus with other specified complication: Secondary | ICD-10-CM

## 2023-02-19 DIAGNOSIS — E538 Deficiency of other specified B group vitamins: Secondary | ICD-10-CM | POA: Diagnosis not present

## 2023-02-20 LAB — CBC WITH DIFFERENTIAL/PLATELET
Absolute Monocytes: 707 cells/uL (ref 200–950)
Basophils Absolute: 61 cells/uL (ref 0–200)
Basophils Relative: 0.9 %
Eosinophils Absolute: 109 cells/uL (ref 15–500)
Eosinophils Relative: 1.6 %
HCT: 51.5 % — ABNORMAL HIGH (ref 38.5–50.0)
Hemoglobin: 17.2 g/dL — ABNORMAL HIGH (ref 13.2–17.1)
Lymphs Abs: 3040 cells/uL (ref 850–3900)
MCH: 29.1 pg (ref 27.0–33.0)
MCHC: 33.4 g/dL (ref 32.0–36.0)
MCV: 87.1 fL (ref 80.0–100.0)
MPV: 9.7 fL (ref 7.5–12.5)
Monocytes Relative: 10.4 %
Neutro Abs: 2883 cells/uL (ref 1500–7800)
Neutrophils Relative %: 42.4 %
Platelets: 175 10*3/uL (ref 140–400)
RBC: 5.91 10*6/uL — ABNORMAL HIGH (ref 4.20–5.80)
RDW: 13.5 % (ref 11.0–15.0)
Total Lymphocyte: 44.7 %
WBC: 6.8 10*3/uL (ref 3.8–10.8)

## 2023-02-20 LAB — HEMOGLOBIN A1C
Hgb A1c MFr Bld: 7.3 % of total Hgb — ABNORMAL HIGH (ref <5.7)
Mean Plasma Glucose: 163 mg/dL
eAG (mmol/L): 9 mmol/L

## 2023-02-20 LAB — COMPLETE METABOLIC PANEL WITH GFR
AG Ratio: 1.5 (calc) (ref 1.0–2.5)
ALT: 12 U/L (ref 9–46)
AST: 20 U/L (ref 10–35)
Albumin: 4.5 g/dL (ref 3.6–5.1)
Alkaline phosphatase (APISO): 44 U/L (ref 35–144)
BUN/Creatinine Ratio: 16 (calc) (ref 6–22)
BUN: 20 mg/dL (ref 7–25)
CO2: 27 mmol/L (ref 20–32)
Calcium: 10.5 mg/dL — ABNORMAL HIGH (ref 8.6–10.3)
Chloride: 100 mmol/L (ref 98–110)
Creat: 1.29 mg/dL — ABNORMAL HIGH (ref 0.70–1.22)
Globulin: 3 g/dL (calc) (ref 1.9–3.7)
Glucose, Bld: 143 mg/dL — ABNORMAL HIGH (ref 65–99)
Potassium: 4.8 mmol/L (ref 3.5–5.3)
Sodium: 137 mmol/L (ref 135–146)
Total Bilirubin: 0.5 mg/dL (ref 0.2–1.2)
Total Protein: 7.5 g/dL (ref 6.1–8.1)
eGFR: 54 mL/min/{1.73_m2} — ABNORMAL LOW (ref >=60)

## 2023-02-20 LAB — LIPID PANEL
Cholesterol: 217 mg/dL — ABNORMAL HIGH (ref <200)
HDL: 36 mg/dL — ABNORMAL LOW (ref >=40)
LDL Cholesterol (Calc): 133 mg/dL (calc) — ABNORMAL HIGH
Non-HDL Cholesterol (Calc): 181 mg/dL (calc) — ABNORMAL HIGH (ref <130)
Total CHOL/HDL Ratio: 6 (calc) — ABNORMAL HIGH (ref <5.0)
Triglycerides: 337 mg/dL — ABNORMAL HIGH (ref <150)

## 2023-02-20 LAB — VITAMIN B12: Vitamin B-12: 622 pg/mL (ref 200–1100)

## 2023-02-20 LAB — PSA: PSA: 0.15 ng/mL (ref <=4.00)

## 2023-02-20 LAB — TSH: TSH: 1.45 mIU/L (ref 0.40–4.50)

## 2023-02-27 ENCOUNTER — Encounter: Payer: Self-pay | Admitting: Family Medicine

## 2023-02-27 ENCOUNTER — Ambulatory Visit: Payer: Medicare HMO | Admitting: Family Medicine

## 2023-02-27 VITALS — BP 134/78 | HR 75 | Ht 70.0 in | Wt 168.0 lb

## 2023-02-27 DIAGNOSIS — E1142 Type 2 diabetes mellitus with diabetic polyneuropathy: Secondary | ICD-10-CM | POA: Diagnosis not present

## 2023-02-27 DIAGNOSIS — N183 Chronic kidney disease, stage 3 unspecified: Secondary | ICD-10-CM

## 2023-02-27 DIAGNOSIS — E785 Hyperlipidemia, unspecified: Secondary | ICD-10-CM

## 2023-02-27 DIAGNOSIS — Z0000 Encounter for general adult medical examination without abnormal findings: Secondary | ICD-10-CM | POA: Diagnosis not present

## 2023-02-27 DIAGNOSIS — E1169 Type 2 diabetes mellitus with other specified complication: Secondary | ICD-10-CM | POA: Diagnosis not present

## 2023-02-27 DIAGNOSIS — I129 Hypertensive chronic kidney disease with stage 1 through stage 4 chronic kidney disease, or unspecified chronic kidney disease: Secondary | ICD-10-CM | POA: Diagnosis not present

## 2023-02-27 MED ORDER — EZETIMIBE 10 MG PO TABS
10.0000 mg | ORAL_TABLET | Freq: Every day | ORAL | 3 refills | Status: DC
Start: 2023-02-27 — End: 2024-03-23

## 2023-02-27 MED ORDER — METFORMIN HCL ER 500 MG PO TB24
500.0000 mg | ORAL_TABLET | Freq: Every day | ORAL | 3 refills | Status: DC
Start: 2023-02-27 — End: 2024-03-23

## 2023-02-27 MED ORDER — LISINOPRIL 5 MG PO TABS
5.0000 mg | ORAL_TABLET | Freq: Every day | ORAL | 3 refills | Status: DC
Start: 2023-02-27 — End: 2023-05-12

## 2023-02-27 NOTE — Progress Notes (Signed)
Subjective:    Patient ID: [name], male    DOB: 04/16/1934, 89 y.o.   MRN: [id number]  [name] is a 89 y.o. male presenting on 02/27/2023 for Annual Exam   HPI  Discussed the use of AI scribe software for clinical note transcription with the patient, who gave verbal consent to proceed.    Here for Annual Physical and Lab Review.  The patient, an 89 year old with a history of diabetes, prostate cancer, and neuropathy, presents for a routine follow-up.   Type 2 Diabetes with Neuropathy Hyperlipidemia Hypertension  Improved A1c from 7.6 down to 7.3 Glucometer not as reliable  CBG 114, avg >100  Followed by Podiatrist for nail trimming and he has good circulation but neuropathy with pain and numb. Using nerve cream for feet He has diabetic shoes  Diet improvement similar to Keto diet. No sugars / grains / dairy. He uses almond milk He is on Metformin XR 500mg  daily   Seems to be off Zetia currently needs new order   On Lisinopril low dose 5mg .   He has seen Vascular specialist in [place]   On Lyrica Pregabalin 75mg  TWICE A DAY - limited relief, unsure if helping. Failed Gabapentin and nerve supplement.  Keep apt with Dr Clydene Pugh once yearly in October 2024. He has had some decline in vision.   Chiropractor has new treatment for feet.  Chronic Rhinitis he admits persistent issue with chronic rhinitis drainage in morning and throughout the day and also some sinus congestion pressure. Uses Flonase and Atrovent limited results   R Shoulder pain / Rotator Cuff  The patient also mentions knee pain, which has been worsening, particularly in the right knee. He uses a topical cream for relief but notes that the pain sometimes affects his walking. He has considered knee replacement surgery but has not pursued it.  He takes protein vitamin shake daily Normal B12   Health Maintenance:  History of COVID in July 2024.  Check with Pharmacy on vaccines Flu  shot and Shingles.  PSA 0.15 negative, surveillance with history of prior Prostate Cancer. S/p radiation therapy.     02/27/2023   11:11 AM 07/26/2022    2:39 PM 04/12/2022    9:59 AM  Depression screen PHQ 2/9  Decreased Interest 0 0 0  Down, Depressed, Hopeless 0 0 0  PHQ - 2 Score 0 0 0  Altered sleeping 0 0 0  Tired, decreased energy 1 1 0  Change in appetite 0 0 0  Feeling bad or failure about yourself  0 0 0  Trouble concentrating 0 0 0  Moving slowly or fidgety/restless 0 0 0  Suicidal thoughts 0 0 0  PHQ-9 Score 1 1 0  Difficult doing work/chores Not difficult at all Not difficult at all Not difficult at all    Past Medical History:  Diagnosis Date   Diabetes mellitus without complication (HCC)    Hyperlipidemia    Hypertension    Neuropathy    Prostate cancer [facility])    Past Surgical History:  Procedure Laterality Date   CATARACT EXTRACTION W/PHACO Left 04/24/2015   Procedure: CATARACT EXTRACTION PHACO AND INTRAOCULAR LENS PLACEMENT (IOC);  Surgeon: Sallee Lange, MD;  Location: [facility] ORS;  Service: Ophthalmology;  Laterality: Left;  [place] 01:12 AP% 25.7 CDE 28.30 fluid pack lot #[id number]   COLONOSCOPY     Social History   Socioeconomic History   Marital status: Widowed    Spouse name: Not on file  Number of children: Not on file   Years of education: Not on file   Highest education level: High school graduate  Occupational History   Not on file  Tobacco Use   Smoking status: Former    Current packs/day: 0.00    Types: Cigarettes    Quit date: 05/27/1978    Years since quitting: 44.7   Smokeless tobacco: Never  Vaping Use   Vaping status: Never Used  Substance and Sexual Activity   Alcohol use: No    Alcohol/week: 0.0 standard drinks of alcohol   Drug use: No   Sexual activity: Not on file  Other Topics Concern   Not on file  Social History Narrative   Lives in retirement community    Social Determinants of Health   Financial Resource Strain:  Low Risk  (04/12/2022)   Overall Financial Resource Strain (CARDIA)    Difficulty of Paying Living Expenses: Not hard at all  Food Insecurity: No Food Insecurity (04/12/2022)   Hunger Vital Sign    Worried About Running Out of Food in the Last Year: Never true    Ran Out of Food in the Last Year: Never true  Transportation Needs: No Transportation Needs (04/12/2022)   PRAPARE - Administrator, Civil Service (Medical): No    Lack of Transportation (Non-Medical): No  Physical Activity: Insufficiently Active (04/12/2022)   Exercise Vital Sign    Days of Exercise per Week: 2 days    Minutes of Exercise per Session: 20 min  Stress: No Stress Concern Present (04/12/2022)   [employer] of Occupational Health - Occupational Stress Questionnaire    Feeling of Stress : Only a little  Social Connections: Moderately Isolated (04/12/2022)   Social Connection and Isolation Panel [NHANES]    Frequency of Communication with Friends and Family: Three times a week    Frequency of Social Gatherings with Friends and Family: Once a week    Attends Religious Services: More than 4 times per year    Active Member of [employer] or Organizations: No    Attends Banker Meetings: Never    Marital Status: Widowed  Intimate Partner Violence: Not At Risk (04/12/2022)   Humiliation, Afraid, Rape, and Kick questionnaire    Fear of Current or Ex-Partner: No    Emotionally Abused: No    Physically Abused: No    Sexually Abused: No   Family History  Problem Relation Age of Onset   Stroke Mother    Cancer Mother        colon cancer   COPD Father    COPD Brother    Hearing loss Son    Current Outpatient Medications on File Prior to Visit  Medication Sig   diclofenac Sodium (VOLTAREN) 1 % GEL Apply 2 g topically 4 (four) times daily as needed (elbow bursitis).   glucosamine-chondroitin 500-400 MG tablet Take 1 tablet by mouth 3 (three) times daily as needed.   pregabalin (LYRICA) 75  MG capsule Take 1 capsule (75 mg total) by mouth 2 (two) times daily.   azelastine (ASTELIN) 0.1 % nasal spray Place 2 sprays into both nostrils 2 (two) times daily. Use in each nostril as directed (Patient taking differently: Place 2 sprays into both nostrils 2 (two) times daily. Use in each nostril as directed PRN)   colchicine 0.6 MG tablet If acute flare only - Day 1: take 2 pills first day, then repeat 1 pill within two hours. Day 2 - take 1 pill  daily for up to max 7 days or stop when gout flare resolves. (Patient not taking: Reported on 02/27/2023)   fluticasone (FLONASE) 50 MCG/ACT nasal spray Place 2 sprays into both nostrils daily. Use for 4-6 weeks then stop and use seasonally or as needed. (Patient not taking: Reported on 02/27/2023)   No current facility-administered medications on file prior to visit.    Review of Systems  Constitutional:  Negative for activity change, appetite change, chills, diaphoresis, fatigue and fever.  HENT:  Negative for congestion and hearing loss.   Eyes:  Negative for visual disturbance.  Respiratory:  Negative for cough, chest tightness, shortness of breath and wheezing.   Cardiovascular:  Negative for chest pain, palpitations and leg swelling.  Gastrointestinal:  Negative for abdominal pain, constipation, diarrhea, nausea and vomiting.  Genitourinary:  Negative for dysuria, frequency and hematuria.  Musculoskeletal:  Negative for arthralgias and neck pain.  Skin:  Negative for rash.  Neurological:  Negative for dizziness, weakness, light-headedness, numbness and headaches.  Hematological:  Negative for adenopathy.  Psychiatric/Behavioral:  Negative for behavioral problems, dysphoric mood and sleep disturbance.    Per HPI unless specifically indicated above      Objective:    BP 134/78   Pulse 75   Ht 5\' 10"  (1.778 m)   Wt 168 lb (76.2 kg)   BMI 24.11 kg/m   Wt Readings from Last 3 Encounters:  02/27/23 168 lb (76.2 kg)  07/26/22 167 lb  (75.8 kg)  04/12/22 162 lb (73.5 kg)    Physical Exam Vitals and nursing note reviewed.  Constitutional:      General: He is not in acute distress.    Appearance: He is well-developed. He is not diaphoretic.     Comments: Well-appearing, comfortable, cooperative  HENT:     Head: Normocephalic and atraumatic.  Eyes:     General:        Right eye: No discharge.        Left eye: No discharge.     Conjunctiva/sclera: Conjunctivae normal.     Pupils: Pupils are equal, round, and reactive to light.  Neck:     Thyroid: No thyromegaly.  Cardiovascular:     Rate and Rhythm: Normal rate and regular rhythm.     Pulses: Normal pulses.     Heart sounds: Normal heart sounds. No murmur heard. Pulmonary:     Effort: Pulmonary effort is normal. No respiratory distress.     Breath sounds: Normal breath sounds. No wheezing or rales.  Abdominal:     General: Bowel sounds are normal. There is no distension.     Palpations: Abdomen is soft. There is no mass.     Tenderness: There is no abdominal tenderness.  Musculoskeletal:        General: No tenderness. Normal range of motion.     Cervical back: Normal range of motion and neck supple.     Right lower leg: No edema.     Left lower leg: No edema.     Comments: Upper / Lower Extremities: - Normal muscle tone, strength bilateral upper extremities 5/5, lower extremities 5/5  Lymphadenopathy:     Cervical: No cervical adenopathy.  Skin:    General: Skin is warm and dry.     Findings: No erythema or rash.  Neurological:     Mental Status: He is alert and oriented to person, place, and time.     Comments: Distal sensation intact to light touch all extremities  Psychiatric:  Mood and Affect: Mood normal.        Behavior: Behavior normal.        Thought Content: Thought content normal.     Comments: Well groomed, good eye contact, normal speech and thoughts      Results for orders placed or performed in visit on 02/19/23  Vitamin B12   Result Value Ref Range   Vitamin B-12 622 200 - 1,100 pg/mL  TSH  Result Value Ref Range   TSH 1.45 0.40 - 4.50 mIU/L  PSA  Result Value Ref Range   PSA 0.15 < OR = 4.00 ng/mL  Hemoglobin A1c  Result Value Ref Range   Hgb A1c MFr Bld 7.3 (H) <5.7 % of total Hgb   Mean Plasma Glucose 163 mg/dL   eAG (mmol/L) 9.0 mmol/L  Lipid panel  Result Value Ref Range   Cholesterol 217 (H) <200 mg/dL   HDL 36 (L) > OR = 40 mg/dL   Triglycerides 782 (H) <150 mg/dL   LDL Cholesterol (Calc) 133 (H) mg/dL (calc)   Total CHOL/HDL Ratio 6.0 (H) <5.0 (calc)   Non-HDL Cholesterol (Calc) 181 (H) <130 mg/dL (calc)  CBC with Differential/Platelet  Result Value Ref Range   WBC 6.8 3.8 - 10.8 Thousand/uL   RBC 5.91 (H) 4.20 - 5.80 Million/uL   Hemoglobin 17.2 (H) 13.2 - 17.1 g/dL   HCT 95.6 (H) 21.3 - 08.6 %   MCV 87.1 80.0 - 100.0 fL   MCH 29.1 27.0 - 33.0 pg   MCHC 33.4 32.0 - 36.0 g/dL   RDW 57.8 46.9 - 62.9 %   Platelets 175 140 - 400 Thousand/uL   MPV 9.7 7.5 - 12.5 fL   Neutro Abs 2,883 1,500 - 7,800 cells/uL   Lymphs Abs 3,040 850 - 3,900 cells/uL   Absolute Monocytes 707 200 - 950 cells/uL   Eosinophils Absolute 109 15 - 500 cells/uL   Basophils Absolute 61 0 - 200 cells/uL   Neutrophils Relative % 42.4 %   Total Lymphocyte 44.7 %   Monocytes Relative 10.4 %   Eosinophils Relative 1.6 %   Basophils Relative 0.9 %  COMPLETE METABOLIC PANEL WITH GFR  Result Value Ref Range   Glucose, Bld 143 (H) 65 - 99 mg/dL   BUN 20 7 - 25 mg/dL   Creat 5.28 (H) 4.13 - 1.22 mg/dL   eGFR 54 (L) > OR = 60 mL/min/1.42m2   BUN/Creatinine Ratio 16 6 - 22 (calc)   Sodium 137 135 - 146 mmol/L   Potassium 4.8 3.5 - 5.3 mmol/L   Chloride 100 98 - 110 mmol/L   CO2 27 20 - 32 mmol/L   Calcium 10.5 (H) 8.6 - 10.3 mg/dL   Total Protein 7.5 6.1 - 8.1 g/dL   Albumin 4.5 3.6 - 5.1 g/dL   Globulin 3.0 1.9 - 3.7 g/dL (calc)   AG Ratio 1.5 1.0 - 2.5 (calc)   Total Bilirubin 0.5 0.2 - 1.2 mg/dL   Alkaline  phosphatase (APISO) 44 35 - 144 U/L   AST 20 10 - 35 U/L   ALT 12 9 - 46 U/L      Assessment & Plan:   Problem List Items Addressed This Visit     Benign hypertension with CKD (chronic kidney disease) stage III (HCC)   Relevant Medications   ezetimibe (ZETIA) 10 MG tablet   lisinopril (ZESTRIL) 5 MG tablet   Hyperlipidemia associated with type 2 diabetes mellitus (HCC)   Relevant Medications  ezetimibe (ZETIA) 10 MG tablet   metFORMIN (GLUCOPHAGE-XR) 500 MG 24 hr tablet   lisinopril (ZESTRIL) 5 MG tablet   Type 2 diabetes mellitus with diabetic polyneuropathy (HCC)   Relevant Medications   metFORMIN (GLUCOPHAGE-XR) 500 MG 24 hr tablet   lisinopril (ZESTRIL) 5 MG tablet   Other Visit Diagnoses     Annual physical exam    -  Primary       Updated Health Maintenance information Reviewed recent lab results with patient Encouraged improvement to lifestyle with diet and exercise Goal of weight loss  Assessment and Plan    Peripheral Neuropathy Chronic pain in feet, no improvement with Pregabalin. Discussed options of discontinuing medication to assess effect or increasing dose. -Consider discontinuing Pregabalin for a trial period to assess effect. -Alternatively, consider increasing Pregabalin to two doses in the morning and one at night. Notify office preferred dose if need new order  Osteoarthritis Chronic knee pain, worse on the right. Using topical cream for relief. -Continue current management.  Type 2 Diabetes Mellitus A1c 7.3, improved from 7.6. Patient is on Metformin. -Continue Metformin. -Check A1c in 6 months.  Hyperlipidemia LDL 133, improved from 146. Unclear if patient is still on Zetia. -Check with pharmacy regarding Zetia prescription. -Will renew now to continue  General Health Maintenance -Check with pharmacy regarding vaccination history, specifically flu and shingles vaccines. -Keep appointment with Dr. Clydene Pugh for annual eye exam in  October. -Continue current medications.         Meds ordered this encounter  Medications   ezetimibe (ZETIA) 10 MG tablet    Sig: Take 1 tablet (10 mg total) by mouth daily.    Dispense:  90 tablet    Refill:  3    Add refills   metFORMIN (GLUCOPHAGE-XR) 500 MG 24 hr tablet    Sig: Take 1 tablet (500 mg total) by mouth daily with breakfast.    Dispense:  90 tablet    Refill:  3    Add future refills   lisinopril (ZESTRIL) 5 MG tablet    Sig: Take 1 tablet (5 mg total) by mouth daily.    Dispense:  90 tablet    Refill:  3    Add future refills      Follow up plan: Return in about 6 months (around 08/28/2023) for 6 month DM A1c mid morning preferred.Saralyn Pilar, DO [facility] 02/27/2023, 11:12 AM

## 2023-02-27 NOTE — Patient Instructions (Addendum)
Thank you for coming to the office today.  Check with [place] [address] on your history on the vaccines  Particularly Flu Vaccine this season, and Shingles vaccine previous.  You are already done with Pneumonia vaccine.  Recent Labs    07/26/22 1805 02/19/23 0830  HGBA1C 7.6* 7.3*   Good work with blood sugar  Keep on metformin and other meds  No change at this time.  Okay to check blood sugar 1-2 times per month only.  Please check with [place] for [employer].  For Pregabalin 75mg  - we discussed  options  You may trial STOPPING this and see if you have no issues when you stop taking it. It may not be doing anything for your feet.  You may also try DOUBLING dose of Lyrica Pregabalin 75mg  x 2 = 150mg  for one dose and take 1 pill 75mg  for the other dose that day.  Let me know which way to proceed.   Please schedule a Follow-up Appointment to: Return in about 6 months (around 08/28/2023) for 6 month DM A1c mid morning preferred..  If you have any other questions or concerns, please feel free to call the office or send a message through MyChart. You may also schedule an earlier appointment if necessary.  Additionally, you may be receiving a survey about your experience at our office within a few days to 1 week by e-mail or mail. We value your feedback.  Saralyn Pilar, DO [facility], [place]

## 2023-03-24 DIAGNOSIS — H5203 Hypermetropia, bilateral: Secondary | ICD-10-CM | POA: Diagnosis not present

## 2023-03-24 DIAGNOSIS — H2511 Age-related nuclear cataract, right eye: Secondary | ICD-10-CM | POA: Diagnosis not present

## 2023-03-24 DIAGNOSIS — H35373 Puckering of macula, bilateral: Secondary | ICD-10-CM | POA: Diagnosis not present

## 2023-03-24 DIAGNOSIS — H0288A Meibomian gland dysfunction right eye, upper and lower eyelids: Secondary | ICD-10-CM | POA: Diagnosis not present

## 2023-03-24 DIAGNOSIS — E119 Type 2 diabetes mellitus without complications: Secondary | ICD-10-CM | POA: Diagnosis not present

## 2023-03-24 DIAGNOSIS — H0288B Meibomian gland dysfunction left eye, upper and lower eyelids: Secondary | ICD-10-CM | POA: Diagnosis not present

## 2023-03-24 LAB — HM DIABETES EYE EXAM

## 2023-04-07 DIAGNOSIS — H43813 Vitreous degeneration, bilateral: Secondary | ICD-10-CM | POA: Diagnosis not present

## 2023-04-07 DIAGNOSIS — H2511 Age-related nuclear cataract, right eye: Secondary | ICD-10-CM | POA: Diagnosis not present

## 2023-04-07 DIAGNOSIS — H35373 Puckering of macula, bilateral: Secondary | ICD-10-CM | POA: Diagnosis not present

## 2023-04-14 ENCOUNTER — Ambulatory Visit: Payer: Self-pay

## 2023-04-14 NOTE — Telephone Encounter (Signed)
      Chief Complaint: Had a stomach virus last week, now has left lower abdominal pain. Hurts to touch the area. Symptoms: Above Frequency: Last week Pertinent Negatives: Patient denies fever or vomiting, diarrhea. Disposition: [] ED /[] Urgent Care (no appt availability in office) / [x] Appointment(In office/virtual)/ []  [facility]/ [] Home Care/ [] Refused Recommended Disposition /[] [place]/ []  Follow-up with PCP Additional Notes: Agrees with appointment.  Reason for Disposition  [1] MODERATE pain (e.g., interferes with normal activities) AND [2] pain comes and goes (cramps) AND [3] present > 24 hours  (Exception: Pain with Vomiting or Diarrhea - see that Guideline.)  Answer Assessment - Initial Assessment Questions 1. LOCATION: "Where does it hurt?"      Left lower 2. RADIATION: "Does the pain shoot anywhere else?" (e.g., chest, back)     No 3. ONSET: "When did the pain begin?" (Minutes, hours or days ago)      Last week 4. SUDDEN: "Gradual or sudden onset?"     Gradual 5. PATTERN "Does the pain come and go, or is it constant?"    - If it comes and goes: "How long does it last?" "Do you have pain now?"     (Note: Comes and goes means the pain is intermittent. It goes away completely between bouts.)    - If constant: "Is it getting better, staying the same, or getting worse?"      (Note: Constant means the pain never goes away completely; most serious pain is constant and gets worse.)      Constant 6. SEVERITY: "How bad is the pain?"  (e.g., Scale 1-10; mild, moderate, or severe)    - MILD (1-3): Doesn't interfere with normal activities, abdomen soft and not tender to touch.     - MODERATE (4-7): Interferes with normal activities or awakens from sleep, abdomen tender to touch.     - SEVERE (8-10): Excruciating pain, doubled over, unable to do any normal activities.       Moderate - hurts to touch 7. RECURRENT SYMPTOM: "Have you ever had this type of stomach  pain before?" If Yes, ask: "When was the last time?" and "What happened that time?"      No 8. CAUSE: "What do you think is causing the stomach pain?"     Had a stomach virus last week 9. RELIEVING/AGGRAVATING FACTORS: "What makes it better or worse?" (e.g., antacids, bending or twisting motion, bowel movement)     No 10. OTHER SYMPTOMS: "Do you have any other symptoms?" (e.g., back pain, diarrhea, fever, urination pain, vomiting)       None  Protocols used: Abdominal Pain - Male-A-AH

## 2023-04-15 ENCOUNTER — Ambulatory Visit (INDEPENDENT_AMBULATORY_CARE_PROVIDER_SITE_OTHER): Payer: Medicare HMO | Admitting: Internal Medicine

## 2023-04-15 ENCOUNTER — Encounter: Payer: Self-pay | Admitting: Internal Medicine

## 2023-04-15 VITALS — BP 110/66 | HR 61 | Ht 70.0 in | Wt 169.4 lb

## 2023-04-15 DIAGNOSIS — K5792 Diverticulitis of intestine, part unspecified, without perforation or abscess without bleeding: Secondary | ICD-10-CM | POA: Diagnosis not present

## 2023-04-15 MED ORDER — AMOXICILLIN-POT CLAVULANATE 875-125 MG PO TABS: 1.0000 | ORAL_TABLET | Freq: Two times a day (BID) | ORAL | 0 refills | Status: DC

## 2023-04-15 NOTE — Progress Notes (Signed)
Subjective:    Patient ID: [name], male    DOB: [birth year], 89 y.o.   MRN: [id number]  HPI  Discussed the use of AI scribe software for clinical note transcription with the patient, who gave verbal consent to proceed.  The patient presented with a four-day history of abdominal pain. The pain was unusual for the patient, who typically does not experience stomach problems. The pain was localized to the left lower part of the abdomen and was described as tender to touch, particularly aggravated by movement. The patient denied any associated nausea, vomiting, or diarrhea. However, he reported a slight change in bowel movements, feeling a bit constipated and not as regular as usual. There was no reported blood in the stool or any urinary problems. The patient denied any fever, chills, or body aches.  The patient had a colonoscopy years ago at [facility], but the details of the procedure were not readily available.   The patient has been trying to maintain a bland diet since the onset of the abdominal pain, with the exception of a breakfast of bacon and eggs on the day of the consultation.        Review of Systems   Past Medical History:  Diagnosis Date   Diabetes mellitus without complication (HCC)    Hyperlipidemia    Hypertension    Neuropathy    Prostate cancer (HCC)     Current Outpatient Medications  Medication Sig Dispense Refill   azelastine (ASTELIN) 0.1 % nasal spray Place 2 sprays into both nostrils 2 (two) times daily. Use in each nostril as directed (Patient taking differently: Place 2 sprays into both nostrils 2 (two) times daily. Use in each nostril as directed PRN) 30 mL 12   colchicine 0.6 MG tablet If acute flare only - Day 1: take 2 pills first day, then repeat 1 pill within two hours. Day 2 - take 1 pill daily for up to max 7 days or stop when gout flare resolves. (Patient not taking: Reported on 02/27/2023) 20 tablet 2   diclofenac Sodium (VOLTAREN) 1  % GEL Apply 2 g topically 4 (four) times daily as needed (elbow bursitis). 100 g 2   ezetimibe (ZETIA) 10 MG tablet Take 1 tablet (10 mg total) by mouth daily. 90 tablet 3   fluticasone (FLONASE) 50 MCG/ACT nasal spray Place 2 sprays into both nostrils daily. Use for 4-6 weeks then stop and use seasonally or as needed. (Patient not taking: Reported on 02/27/2023) 16 g 3   glucosamine-chondroitin 500-400 MG tablet Take 1 tablet by mouth 3 (three) times daily as needed.     lisinopril (ZESTRIL) 5 MG tablet Take 1 tablet (5 mg total) by mouth daily. 90 tablet 3   metFORMIN (GLUCOPHAGE-XR) 500 MG 24 hr tablet Take 1 tablet (500 mg total) by mouth daily with breakfast. 90 tablet 3   pregabalin (LYRICA) 75 MG capsule Take 1 capsule (75 mg total) by mouth 2 (two) times daily. 180 capsule 1   No current facility-administered medications for this visit.    No Known Allergies  Family History  Problem Relation Age of Onset   Stroke Mother    Cancer Mother        colon cancer   COPD Father    COPD Brother    Hearing loss Son     Social History   Socioeconomic History   Marital status: Widowed    Spouse name: Not on file   Number of children:  Not on file   Years of education: Not on file   Highest education level: High school graduate  Occupational History   Not on file  Tobacco Use   Smoking status: Former    Current packs/day: 0.00    Types: Cigarettes    Quit date: 05/27/1978    Years since quitting: 44.9   Smokeless tobacco: Never  Vaping Use   Vaping status: Never Used  Substance and Sexual Activity   Alcohol use: No    Alcohol/week: 0.0 standard drinks of alcohol   Drug use: No   Sexual activity: Not on file  Other Topics Concern   Not on file  Social History Narrative   Lives in retirement community    Social Determinants of Health   Financial Resource Strain: Low Risk  (04/12/2022)   Overall Financial Resource Strain (CARDIA)    Difficulty of Paying Living Expenses:  Not hard at all  Food Insecurity: No Food Insecurity (04/12/2022)   Hunger Vital Sign    Worried About Running Out of Food in the Last Year: Never true    Ran Out of Food in the Last Year: Never true  Transportation Needs: No Transportation Needs (04/12/2022)   PRAPARE - Administrator, Civil Service (Medical): No    Lack of Transportation (Non-Medical): No  Physical Activity: Insufficiently Active (04/12/2022)   Exercise Vital Sign    Days of Exercise per Week: 2 days    Minutes of Exercise per Session: 20 min  Stress: No Stress Concern Present (04/12/2022)   [employer] of Occupational Health - Occupational Stress Questionnaire    Feeling of Stress : Only a little  Social Connections: Moderately Isolated (04/12/2022)   Social Connection and Isolation Panel [NHANES]    Frequency of Communication with Friends and Family: Three times a week    Frequency of Social Gatherings with Friends and Family: Once a week    Attends Religious Services: More than 4 times per year    Active Member of [employer] or Organizations: No    Attends Banker Meetings: Never    Marital Status: Widowed  Intimate Partner Violence: Not At Risk (04/12/2022)   Humiliation, Afraid, Rape, and Kick questionnaire    Fear of Current or Ex-Partner: No    Emotionally Abused: No    Physically Abused: No    Sexually Abused: No     Constitutional: Denies fever, malaise, fatigue, headache or abrupt weight changes.  Respiratory: Denies difficulty breathing, shortness of breath, cough or sputum production.   Cardiovascular: Denies chest pain, chest tightness, palpitations or swelling in the hands or feet.  Gastrointestinal: Patient reports LLQ abdominal pain, constipation and bloating.  Denies diarrhea or blood in the stool.  GU: Denies urgency, frequency, pain with urination, burning sensation, blood in urine, odor or discharge. Musculoskeletal: Denies decrease in range of motion, difficulty  with gait, muscle pain or joint pain and swelling.   No other specific complaints in a complete review of systems (except as listed in HPI above).      Objective:   Physical Exam  BP 110/66   Pulse 61   Ht 5\' 10"  (1.778 m)   Wt 169 lb 6.4 oz (76.8 kg)   SpO2 95%   BMI 24.31 kg/m   Wt Readings from Last 3 Encounters:  02/27/23 168 lb (76.2 kg)  07/26/22 167 lb (75.8 kg)  04/12/22 162 lb (73.5 kg)    General: Appears his stated age, well developed, well nourished  in NAD. Skin: Warm, dry and intact. Cardiovascular: Normal rate and rhythm. S1,S2 noted.  No murmur, rubs or gallops noted.  Pulmonary/Chest: Normal effort and positive vesicular breath sounds. No respiratory distress. No wheezes, rales or ronchi noted.  Abdomen: Soft and tender in the LLQ. Hypoactive bowel sounds. Slightly distended but no masses noted.  Musculoskeletal: No difficulty with gait.  Neurological: Alert and oriented. Coordination normal.    BMET    Component Value Date/Time   NA 137 02/19/2023 0830   NA 140 09/04/2015 0824   K 4.8 02/19/2023 0830   CL 100 02/19/2023 0830   CO2 27 02/19/2023 0830   GLUCOSE 143 (H) 02/19/2023 0830   BUN 20 02/19/2023 0830   BUN 25 09/04/2015 0824   CREATININE 1.29 (H) 02/19/2023 0830   CALCIUM 10.5 (H) 02/19/2023 0830   GFRNONAA 57 (L) 12/31/2019 0922   GFRAA 66 12/31/2019 0922    Lipid Panel     Component Value Date/Time   CHOL 217 (H) 02/19/2023 0830   CHOL 182 09/04/2015 0824   TRIG 337 (H) 02/19/2023 0830   HDL 36 (L) 02/19/2023 0830   HDL 32 (L) 09/04/2015 0824   CHOLHDL 6.0 (H) 02/19/2023 0830   VLDL 51 (H) 06/25/2016 0001   LDLCALC 133 (H) 02/19/2023 0830    CBC    Component Value Date/Time   WBC 6.8 02/19/2023 0830   RBC 5.91 (H) 02/19/2023 0830   HGB 17.2 (H) 02/19/2023 0830   HGB 16.8 09/04/2015 0824   HCT 51.5 (H) 02/19/2023 0830   HCT 47.6 09/04/2015 0824   PLT 175 02/19/2023 0830   PLT 144 (L) 09/04/2015 0824   MCV 87.1  02/19/2023 0830   MCV 85 09/04/2015 0824   MCH 29.1 02/19/2023 0830   MCHC 33.4 02/19/2023 0830   RDW 13.5 02/19/2023 0830   RDW 13.6 09/04/2015 0824   LYMPHSABS 3,040 02/19/2023 0830   LYMPHSABS 2.2 09/04/2015 0824   MONOABS 580 06/25/2016 0001   EOSABS 109 02/19/2023 0830   EOSABS 0.1 09/04/2015 0824   BASOSABS 61 02/19/2023 0830   BASOSABS 0.0 09/04/2015 0824    Hgb A1C Lab Results  Component Value Date   HGBA1C 7.3 (H) 02/19/2023            Assessment & Plan:   Assessment and Plan    Suspected Diverticulitis New onset lower abdominal pain and tenderness for 4 days. No nausea, vomiting, diarrhea, or blood in stool. Mild constipation. No recent colonoscopy or CT scan to confirm diverticulosis. -Start Augmentin 875-125 mg twice daily for 10 days. -Advise bland diet. -If symptoms worsen or new symptoms develop (fever, chills, nausea, vomiting, blood in stool), patient to notify the clinic immediately. -Consider CT scan if symptoms do not improve with antibiotics.       Follow-up with your PCP as previously scheduled Nicki Reaper, NP

## 2023-04-15 NOTE — Patient Instructions (Signed)
Diverticulitis  Diverticulitis is when small pouches in your colon get infected or swollen. This causes pain in your belly (abdomen) and watery poop (diarrhea). The small pouches are called diverticula. They may form if you have a condition called diverticulosis. What are the causes? You may get this condition if poop (stool) gets trapped in the pouches in your colon. The poop lets germs (bacteria) grow. This causes an infection. What increases the risk? You are more likely to get this condition if you have small pouches in your colon. You are also more likely to get it if: You are overweight or very overweight (obese). You do not exercise enough. You drink alcohol. You smoke. You eat a lot of red meat, like beef, pork, or lamb. You do not eat enough fiber. You are older than 87 years of age. What are the signs or symptoms? Pain in your belly. Pain is often on the left side, but it may be felt in other spots too. Fever and chills. Feeling like you may vomit. Vomiting. Having cramps. Feeling full. Changes in how often you poop. Blood in your poop. How is this treated? Most cases are treated at home. You may be told to: Take over-the-counter pain medicines. Only eat and drink clear liquids. Take antibiotics. Rest. Very bad cases may need to be treated at a hospital. Treatment may include: Not eating or drinking. Taking pain medicines. Getting antibiotics through an IV tube. Getting fluid and food through an IV tube. Having surgery. When you are feeling better, you may need to have a test to look at your colon (colonoscopy). Follow these instructions at home: Medicines Take over-the-counter and prescription medicines only as told by your doctor. These include: Fiber pills. Probiotics. Medicines to make your poop soft (stool softeners). If you were prescribed antibiotics, take them as told by your doctor. Do not stop taking them even if you start to feel better. Ask your  doctor if you should avoid driving or using machines while you are taking your medicine. Eating and drinking  Follow the diet told by your doctor. You may need to only eat and drink liquids. When you feel better, you may be able to eat more foods. You may also be told to eat a lot of fiber. Fiber helps you poop. Foods with fiber include berries, beans, lentils, and green vegetables. Try not to eat red meat. General instructions Do not smoke or use any products that contain nicotine or tobacco. If you need help quitting, ask your doctor. Exercise 3 or more times a week. Try to go for 30 minutes each time. Exercise enough to sweat and make your heart beat faster. Contact a doctor if: Your pain gets worse. You are not pooping like normal. Your symptoms do not get better. Your symptoms get worse very fast. You have a fever. You vomit more than one time. You have poop that is: Bloody. Black. Tarry. This information is not intended to replace advice given to you by your health care provider. Make sure you discuss any questions you have with your health care provider. Document Revised: 02/07/2022 Document Reviewed: 02/07/2022 Elsevier Patient Education  2024 [employer].

## 2023-04-27 DIAGNOSIS — I213 ST elevation (STEMI) myocardial infarction of unspecified site: Secondary | ICD-10-CM

## 2023-04-27 HISTORY — DX: ST elevation (STEMI) myocardial infarction of unspecified site: I21.3

## 2023-05-01 ENCOUNTER — Ambulatory Visit: Payer: Medicare HMO | Admitting: Podiatry

## 2023-05-01 DIAGNOSIS — M79675 Pain in left toe(s): Secondary | ICD-10-CM

## 2023-05-01 DIAGNOSIS — M792 Neuralgia and neuritis, unspecified: Secondary | ICD-10-CM

## 2023-05-01 DIAGNOSIS — B351 Tinea unguium: Secondary | ICD-10-CM

## 2023-05-01 DIAGNOSIS — M79674 Pain in right toe(s): Secondary | ICD-10-CM

## 2023-05-01 NOTE — Progress Notes (Signed)
  Subjective:  Patient ID: [name], male    DOB: March 10, 1934,  MRN: [id number]  89 y.o. male presents to clinic with  at risk foot care with history of diabetic neuropathy and painful elongated mycotic toenails 1-5 bilaterally which are tender when wearing enclosed shoe gear. Pain is relieved with periodic professional debridement. Patient states he still has neuropathy symptoms.   New problem(s): None   PCP is [name], DO.  No Known Allergies  Review of Systems: Negative except as noted in the HPI.   Objective:  [name] is a pleasant 89 y.o. male WD, WN in NAD. AAO x 3.  Vascular Examination: Vascular status intact b/l with palpable pedal pulses. CFT immediate b/l. No edema. No pain with calf compression b/l. Skin temperature gradient WNL b/l.   Neurological Examination: Sensation grossly intact b/l with 10 gram monofilament. Vibratory sensation intact b/l. Pt has subjective symptoms of neuropathy.  Dermatological Examination: Pedal skin with normal turgor, texture and tone b/l. Toenails 1-5 b/l thick, discolored, elongated with subungual debris and pain on dorsal palpation. No hyperkeratotic lesions noted b/l.   Musculoskeletal Examination: Muscle strength 5/5 to b/l LE. No pain, crepitus or joint limitation noted with ROM bilateral LE. No gross bony deformities bilaterally.  Radiographs: None  Last A1c:      Latest Ref Rng & Units 02/19/2023    8:30 AM 07/26/2022    6:05 PM  Hemoglobin A1C  Hemoglobin-A1c <5.7 % of total Hgb 7.3  7.6     Assessment:   1. Pain due to onychomycosis of toenails of both feet   2. Neuropathic pain    Plan:  Patient was evaluated and treated. All patient's and/or POA's questions/concerns addressed on today's visit. Toenails 1-5 debrided in length and girth without incident. Continue soft, supportive shoe gear daily. Report any pedal injuries to medical professional. Call office if there are any  questions/concerns. -Discussed neuropathy symptoms. Advised patient/POA to purchase Nervive Pain Cream or Roll On. Apply to foot/feet before bedtime. -Patient/POA to call should there be question/concern in the interim.  Return in about 3 months (around 07/30/2023).  Freddie Breech, DPM       LOCATION: [address], [place] [address]                   Office [phone]   [facility] LOCATION: [address] [place], [place] [address] Office [phone]

## 2023-05-04 ENCOUNTER — Encounter: Payer: Self-pay | Admitting: Podiatry

## 2023-05-05 ENCOUNTER — Ambulatory Visit: Payer: Self-pay | Admitting: *Deleted

## 2023-05-05 NOTE — Telephone Encounter (Signed)
  Chief Complaint: SOB, decreased energy Symptoms: States has "Felt weak" since dx with diverticulosis 04/15/23, on ATBs 10 days. Reports decreased energy, SOB with minimal exertion. Frequency: 1 month Pertinent Negatives: Patient denies CP, wheezing Disposition: [] ED /[] Urgent Care (no appt availability in office) / [x] Appointment(In office/virtual)/ []  [facility]/ [] Home Care/ [] Refused Recommended Disposition /[] [facility]/ []  Follow-up with PCP Additional Notes: Pt also concerned he has a surgical procedure scheduled for 05/12/23. Asking if he should cancel "Not like me to feel weak and SOB." Advised appt with PCP, Secured appt for tomorrow. Care advise provided, advised ED for worsening symptoms. Pt verbalizes understanding.  Reason for Disposition  [1] MODERATE longstanding difficulty breathing (e.g., speaks in phrases, SOB even at rest, pulse 100-120) AND [2] SAME as normal  [1] MILD difficulty breathing (e.g., minimal/no SOB at rest, SOB with walking, pulse <100) AND [2] NEW-onset or WORSE than normal  Answer Assessment - Initial Assessment Questions 1. RESPIRATORY STATUS: "Describe your breathing?" (e.g., wheezing, shortness of breath, unable to speak, severe coughing)      SOB with minimal exertion 2. ONSET: "When did this breathing problem begin?"      "When seen for diverticulosis." 3. PATTERN "Does the difficult breathing come and go, or has it been constant since it started?"      With exertion 4. SEVERITY: "How bad is your breathing?" (e.g., mild, moderate, severe)    - MILD: No SOB at rest, mild SOB with walking, speaks normally in sentences, can lie down, no retractions, pulse < 100.    - MODERATE: SOB at rest, SOB with minimal exertion and prefers to sit, cannot lie down flat, speaks in phrases, mild retractions, audible wheezing, pulse 100-120.    - SEVERE: Very SOB at rest, speaks in single words, struggling to breathe, sitting hunched forward,  retractions, pulse > 120      Mild-moderate. Worse at night 5. RECURRENT SYMPTOM: "Have you had difficulty breathing before?" If Yes, ask: "When was the last time?" and "What happened that time?"      No 6. CARDIAC HISTORY: "Do you have any history of heart disease?" (e.g., heart attack, angina, bypass surgery, angioplasty)       7. LUNG HISTORY: "Do you have any history of lung disease?"  (e.g., pulmonary embolus, asthma, emphysema)     No 8. CAUSE: "What do you think is causing the breathing problem?"      Unsure 9. OTHER SYMPTOMS: "Do you have any other symptoms? (e.g., dizziness, runny nose, cough, chest pain, fever)     Runny nose in mornings,"Stuffy now." 10. O2 SATURATION MONITOR:  "Do you use an oxygen saturation monitor (pulse oximeter) at home?" If Yes, ask: "What is your reading (oxygen level) today?" "What is your usual oxygen saturation reading?" (e.g., 95%)       NA  Protocols used: Breathing Difficulty-A-AH

## 2023-05-06 ENCOUNTER — Encounter: Payer: Self-pay | Admitting: Family Medicine

## 2023-05-06 ENCOUNTER — Ambulatory Visit (INDEPENDENT_AMBULATORY_CARE_PROVIDER_SITE_OTHER): Payer: Medicare HMO | Admitting: Family Medicine

## 2023-05-06 VITALS — BP 132/64 | HR 58 | Ht 70.0 in | Wt 169.0 lb

## 2023-05-06 DIAGNOSIS — K5790 Diverticulosis of intestine, part unspecified, without perforation or abscess without bleeding: Secondary | ICD-10-CM

## 2023-05-06 DIAGNOSIS — R051 Acute cough: Secondary | ICD-10-CM

## 2023-05-06 DIAGNOSIS — K5900 Constipation, unspecified: Secondary | ICD-10-CM | POA: Diagnosis not present

## 2023-05-06 DIAGNOSIS — R0609 Other forms of dyspnea: Secondary | ICD-10-CM | POA: Diagnosis not present

## 2023-05-06 DIAGNOSIS — M546 Pain in thoracic spine: Secondary | ICD-10-CM

## 2023-05-06 MED ORDER — POLYETHYLENE GLYCOL 3350 17 GM/SCOOP PO POWD
17.0000 g | Freq: Every day | ORAL | 1 refills | Status: AC | PRN
Start: 2023-05-06 — End: ?

## 2023-05-06 MED ORDER — PREDNISONE 20 MG PO TABS
ORAL_TABLET | ORAL | 0 refills | Status: DC
Start: 2023-05-06 — End: 2023-05-12

## 2023-05-06 NOTE — Patient Instructions (Addendum)
Thank you for coming to the office today.  For Constipation (less frequent bowel movement that can be hard dry or involve straining).  Recommend trying OTC Miralax 17g = 1 capful in large glass water once daily for now, try several days to see if working, goal is soft stool or BM 1-2 times daily, if too loose then reduce dose or try every other day. If not effective may need to increase it to 2 doses at once in AM or may do 1 in morning and 1 in afternoon/evening  - This medicine is very safe and can be used often without any problem and will not make you dehydrated. It is good for use on AS NEEDED BASIS or even MAINTENANCE therapy for longer term for several days to weeks at a time to help regulate bowel movements  Other more natural remedies or preventative treatment: - Increase hydration with water - Increase fiber in diet (high fiber foods = vegetables, leafy greens, oats/grains) - May take OTC Fiber supplement (metamucil powder or pill/gummy) - May try OTC Probiotic  If you have any significant chest pain that does not go away within 30 minutes, is accompanied by nausea, sweating, shortness of breath, or made worse by activity, this may be evidence of a heart attack, especially if symptoms worsening instead of improving, please call 911 or go directly to the emergency room immediately for evaluation.   Please schedule a Follow-up Appointment to: Return if symptoms worsen or fail to improve.  If you have any other questions or concerns, please feel free to call the office or send a message through MyChart. You may also schedule an earlier appointment if necessary.  Additionally, you may be receiving a survey about your experience at our office within a few days to 1 week by e-mail or mail. We value your feedback.  Saralyn Pilar, DO [facility], [place]

## 2023-05-06 NOTE — Progress Notes (Signed)
Subjective:    Patient ID: [name], male    DOB: [birth year], 89 y.o.   MRN: [id number]  [name] is a 89 y.o. male presenting on 05/06/2023 for Acute Visit (Follow up stomach pain )   HPI  Discussed the use of AI scribe software for clinical note transcription with the patient, who gave verbal consent to proceed.  Dyspnea / Cough Abdominal Pain / Constipation  The patient, with a history of diverticulosis, presented with persistent abdominal bloating and discomfort, despite a 10-day course of antibiotics. The abdominal pain, initially located in the lower left quadrant, has since resolved. However, the patient reported ongoing bloating and discomfort, particularly postprandially. - He admits constipation. Not on medicine for it.  In addition to the abdominal symptoms, the patient reported new onset shortness of breath and chest tightness, which worsened with exertion Denies fever or chills  The patient also reported intermittent low back pain, described as a shooting pain that occasionally 'catches.' The pain was not constant and seemed to resolve spontaneously.  The patient also noted increased congestion and a sensation of head 'tightness.' Despite a recent course of antibiotics, these symptoms persisted.  The patient's bowel movements were reported as irregular, with a small amount each morning, often triggered by coffee. The patient speculated that this could be contributing to the abdominal bloating.  The patient also reported tightness and discomfort in the lower extremities, which they attributed to a separate, pre-existing condition. They had recently started an over-the-counter medication, Norvir, for foot pain.  The patient has a family history of COPD and lung disease, but denied any personal history of lung problems. They were scheduled for cataract and retina surgery in the near future, but expressed some hesitation about proceeding with these procedures.       Last visit 04/15/23 saw Nicki Reaper, FNP, diagnosed with Diverticulitis, and abdominal bloating and pain, he was treated with Augmentin course, and had some improvement. He has small bowel movement each day.  He experiences shortness of breath with exertion and activity. He has some chest discomfort.  Also has episodic pains in low back, feels like will get a catch in back. Can last for a while.      05/06/2023    8:35 AM 04/15/2023   11:45 AM 02/27/2023   11:11 AM  Depression screen PHQ 2/9  Decreased Interest 0 0 0  Down, Depressed, Hopeless 0 0 0  PHQ - 2 Score 0 0 0  Altered sleeping 0  0  Tired, decreased energy 1  1  Change in appetite 0  0  Feeling bad or failure about yourself  0  0  Trouble concentrating 0  0  Moving slowly or fidgety/restless 0  0  Suicidal thoughts 0  0  PHQ-9 Score 1  1  Difficult doing work/chores   Not difficult at all       05/06/2023    8:35 AM 04/15/2023   11:45 AM 02/27/2023   11:11 AM 07/26/2022    2:40 PM  GAD 7 : Generalized Anxiety Score  Nervous, Anxious, on Edge 1 0 0 0  Control/stop worrying 1 1 0 0  Worry too much - different things 1 1 0 0  Trouble relaxing 0 0 0 0  Restless 0 0 0 0  Easily annoyed or irritable 1 0 0 0  Afraid - awful might happen 0 0 0 0  Total GAD 7 Score 4 2 0 0  Anxiety Difficulty  Somewhat difficult  Not difficult at all    Social History   Tobacco Use   Smoking status: Former    Current packs/day: 0.00    Types: Cigarettes    Quit date: 05/27/1978    Years since quitting: 44.9   Smokeless tobacco: Never  Vaping Use   Vaping status: Never Used  Substance Use Topics   Alcohol use: No    Alcohol/week: 0.0 standard drinks of alcohol   Drug use: No    Review of Systems Per HPI unless specifically indicated above     Objective:    BP 132/64   Pulse (!) 58   Ht 5\' 10"  (1.778 m)   Wt 169 lb (76.7 kg)   BMI 24.25 kg/m   Wt Readings from Last 3 Encounters:  05/06/23 169 lb (76.7 kg)   04/15/23 169 lb 6.4 oz (76.8 kg)  02/27/23 168 lb (76.2 kg)    Physical Exam Vitals and nursing note reviewed.  Constitutional:      General: He is not in acute distress.    Appearance: He is well-developed. He is not diaphoretic.     Comments: Well-appearing, comfortable, cooperative  HENT:     Head: Normocephalic and atraumatic.  Eyes:     General:        Right eye: No discharge.        Left eye: No discharge.     Conjunctiva/sclera: Conjunctivae normal.  Neck:     Thyroid: No thyromegaly.  Cardiovascular:     Rate and Rhythm: Normal rate and regular rhythm.     Pulses: Normal pulses.     Heart sounds: Normal heart sounds. No murmur heard. Pulmonary:     Effort: Pulmonary effort is normal. No respiratory distress.     Breath sounds: Normal breath sounds. No wheezing or rales.  Musculoskeletal:        General: Normal range of motion.     Cervical back: Normal range of motion and neck supple.  Lymphadenopathy:     Cervical: No cervical adenopathy.  Skin:    General: Skin is warm and dry.     Findings: No erythema or rash.  Neurological:     Mental Status: He is alert and oriented to person, place, and time. Mental status is at baseline.  Psychiatric:        Behavior: Behavior normal.     Comments: Well groomed, good eye contact, normal speech and thoughts     Results for orders placed or performed in visit on 04/23/23  HM DIABETES EYE EXAM  Result Value Ref Range   HM Diabetic Eye Exam No Retinopathy No Retinopathy      Assessment & Plan:   Problem List Items Addressed This Visit   None Visit Diagnoses     Constipation, unspecified constipation type    -  Primary   Relevant Medications   polyethylene glycol powder (GLYCOLAX/MIRALAX) 17 GM/SCOOP powder   Diverticulosis       Dyspnea on exertion       Relevant Medications   predniSONE (DELTASONE) 20 MG tablet   Acute cough       Relevant Medications   predniSONE (DELTASONE) 20 MG tablet   Acute  right-sided thoracic back pain       Relevant Medications   predniSONE (DELTASONE) 20 MG tablet         Diverticulosis - resolved flare. Constipation Recent flare with abdominal pain and bloating. Symptoms improved with antibiotics but bloating and constipation persist. -  Start over-the-counter Miralax for constipation and bloating.  Chest Tightness and Shortness of Breath New onset, not associated with cough. Lungs clear on auscultation. Possible musculoskeletal cause for back pain and rib pain seems reproducible on exam.  -Start a short course of Prednisone to help with possible inflammation and dyspnea, seems due to chest congestion. -If symptoms worsen or persist, consider chest x-ray or hospital evaluation.  Low Back Pain New onset, intermittent, possibly musculoskeletal. -Monitor symptoms.  Upcoming Cataract and Retina Surgery Scheduled for December 16. -Continue with planned surgery as symptoms are unrelated.  Neuropathic Pain New onset, taking over-the-counter Nervive. -Continue Nervive as tolerated.  Follow-up Monitor response to treatments. If symptoms persist or worsen, consider further evaluation including possible chest x-ray.      Return precautions if chest pain or pressure or worsening dyspnea or new symptoms. When to go to hospital ED or UC   No orders of the defined types were placed in this encounter.   Meds ordered this encounter  Medications   predniSONE (DELTASONE) 20 MG tablet    Sig: Take daily with food. Start with 60mg  (3 pills) x 2 days, then reduce to 40mg  (2 pills) x 2 days, then 20mg  (1 pill) x 3 days    Dispense:  13 tablet    Refill:  0   polyethylene glycol powder (GLYCOLAX/MIRALAX) 17 GM/SCOOP powder    Sig: Take 17-34 g by mouth daily as needed for moderate constipation or mild constipation.    Dispense:  238 g    Refill:  1    Follow up plan: Return if symptoms worsen or fail to improve.   Saralyn Pilar, DO [facility] 05/06/2023, 9:14 AM

## 2023-05-09 ENCOUNTER — Ambulatory Visit (INDEPENDENT_AMBULATORY_CARE_PROVIDER_SITE_OTHER): Payer: Medicare HMO

## 2023-05-09 DIAGNOSIS — Z0000 Encounter for general adult medical examination without abnormal findings: Secondary | ICD-10-CM

## 2023-05-09 NOTE — Progress Notes (Signed)
Subjective:   ICHOLAS Ryan is a 89 y.o. male who presents for Medicare Annual/Subsequent preventive examination.  Visit Complete: Virtual I connected with  BRONCO STEM on 05/09/23 by a audio enabled telemedicine application and verified that I am speaking with the correct person using two identifiers.  Patient Location: Home  Provider Location: Office/Clinic  I discussed the limitations of evaluation and management by telemedicine. The patient expressed understanding and agreed to proceed.  Vital Signs: Because this visit was a virtual/telehealth visit, some criteria may be missing or patient reported. Any vitals not documented were not able to be obtained and vitals that have been documented are patient reported.  Cardiac Risk Factors include: advanced age (>44men, >33 women);diabetes mellitus;dyslipidemia;male gender;hypertension;sedentary lifestyle     Objective:    Today's Vitals   05/09/23 1028  PainSc: 2    There is no height or weight on file to calculate BMI.     05/09/2023   10:35 AM 04/12/2022   10:01 AM 12/29/2018    9:36 AM 12/23/2017   10:05 AM 12/13/2016    5:07 PM 10/22/2016    1:58 PM 04/24/2015    7:07 AM  Advanced Directives  Does Patient Have a Medical Advance Directive? No No Yes No No No No  Type of Advance Directive   Living will;Healthcare Power of Attorney      Copy of Healthcare Power of Attorney in Chart?   No - copy requested      Would patient like information on creating a medical advance directive? No - Patient declined No - Patient declined  Yes (MAU/Ambulatory/Procedural Areas - Information given)  No - Patient declined No - patient declined information    Current Medications (verified) Outpatient Encounter Medications as of 05/09/2023  Medication Sig   azelastine (ASTELIN) 0.1 % nasal spray Place 2 sprays into both nostrils 2 (two) times daily. Use in each nostril as directed (Patient taking differently: Place 2 sprays into both  nostrils 2 (two) times daily. Use in each nostril as directed PRN)   colchicine 0.6 MG tablet If acute flare only - Day 1: take 2 pills first day, then repeat 1 pill within two hours. Day 2 - take 1 pill daily for up to max 7 days or stop when gout flare resolves.   diclofenac Sodium (VOLTAREN) 1 % GEL Apply 2 g topically 4 (four) times daily as needed (elbow bursitis).   ezetimibe (ZETIA) 10 MG tablet Take 1 tablet (10 mg total) by mouth daily.   fluticasone (FLONASE) 50 MCG/ACT nasal spray Place 2 sprays into both nostrils daily. Use for 4-6 weeks then stop and use seasonally or as needed.   lisinopril (ZESTRIL) 5 MG tablet Take 1 tablet (5 mg total) by mouth daily.   metFORMIN (GLUCOPHAGE-XR) 500 MG 24 hr tablet Take 1 tablet (500 mg total) by mouth daily with breakfast.   montelukast (SINGULAIR) 10 MG tablet Take 10 mg by mouth daily.   polyethylene glycol powder (GLYCOLAX/MIRALAX) 17 GM/SCOOP powder Take 17-34 g by mouth daily as needed for moderate constipation or mild constipation.   predniSONE (DELTASONE) 20 MG tablet Take daily with food. Start with 60mg  (3 pills) x 2 days, then reduce to 40mg  (2 pills) x 2 days, then 20mg  (1 pill) x 3 days   pregabalin (LYRICA) 75 MG capsule Take 1 capsule (75 mg total) by mouth 2 (two) times daily.   glucosamine-chondroitin 500-400 MG tablet Take 1 tablet by mouth 3 (three) times daily as  needed. (Patient not taking: Reported on 05/09/2023)   No facility-administered encounter medications on file as of 05/09/2023.    Allergies (verified) Patient has no known allergies.   History: Past Medical History:  Diagnosis Date   Diabetes mellitus without complication (HCC)    Hyperlipidemia    Hypertension    Neuropathy    Prostate cancer [facility])    Past Surgical History:  Procedure Laterality Date   CATARACT EXTRACTION W/PHACO Left 04/24/2015   Procedure: CATARACT EXTRACTION PHACO AND INTRAOCULAR LENS PLACEMENT (IOC);  Surgeon: Sallee Lange, MD;   Location: [facility] ORS;  Service: Ophthalmology;  Laterality: Left;  [place] 01:12 AP% 25.7 CDE 28.30 fluid pack lot #[id number]   COLONOSCOPY     Family History  Problem Relation Age of Onset   Stroke Mother    Cancer Mother        colon cancer   COPD Father    COPD Brother    Hearing loss Son    Social History   Socioeconomic History   Marital status: Widowed    Spouse name: Not on file   Number of children: Not on file   Years of education: Not on file   Highest education level: High school graduate  Occupational History   Not on file  Tobacco Use   Smoking status: Former    Current packs/day: 0.00    Types: Cigarettes    Quit date: 05/27/1978    Years since quitting: 44.9   Smokeless tobacco: Never  Vaping Use   Vaping status: Never Used  Substance and Sexual Activity   Alcohol use: No    Alcohol/week: 0.0 standard drinks of alcohol   Drug use: No   Sexual activity: Not on file  Other Topics Concern   Not on file  Social History Narrative   Lives in retirement community    Social Drivers of Health   Financial Resource Strain: Low Risk  (05/09/2023)   Overall Financial Resource Strain (CARDIA)    Difficulty of Paying Living Expenses: Not hard at all  Food Insecurity: No Food Insecurity (05/09/2023)   Hunger Vital Sign    Worried About Running Out of Food in the Last Year: Never true    Ran Out of Food in the Last Year: Never true  Transportation Needs: No Transportation Needs (05/09/2023)   PRAPARE - Administrator, Civil Service (Medical): No    Lack of Transportation (Non-Medical): No  Physical Activity: Insufficiently Active (05/09/2023)   Exercise Vital Sign    Days of Exercise per Week: 2 days    Minutes of Exercise per Session: 10 min  Stress: No Stress Concern Present (05/09/2023)   [employer] of Occupational Health - Occupational Stress Questionnaire    Feeling of Stress : Not at all  Social Connections: Socially Integrated (05/09/2023)    Social Connection and Isolation Panel [NHANES]    Frequency of Communication with Friends and Family: More than three times a week    Frequency of Social Gatherings with Friends and Family: Twice a week    Attends Religious Services: More than 4 times per year    Active Member of [employer] or Organizations: Yes    Attends Engineer, structural: More than 4 times per year    Marital Status: Married    Tobacco Counseling Counseling given: Not Answered   Clinical Intake:  Pre-visit preparation completed: Yes  Pain : 0-10 Pain Score: 2  Pain Type: Acute pain Pain Location: Abdomen Pain Orientation: Lower  Pain Radiating Towards: DIVERTICULITIS Pain Descriptors / Indicators: Tender Pain Onset: More than a month ago Pain Frequency: Intermittent     BMI - recorded: 24.2 Nutritional Status: BMI of 19-24  Normal Nutritional Risks: None Diabetes: Yes CBG done?: No Did pt. bring in CBG monitor from home?: No  How often do you need to have someone help you when you read instructions, pamphlets, or other written materials from your doctor or pharmacy?: 1 - Never  Interpreter Needed?: No  Information entered by :: Kennedy Bucker, LPN   Activities of Daily Living    05/09/2023   10:36 AM 02/27/2023   11:11 AM  In your present state of health, do you have any difficulty performing the following activities:  Hearing? 1 1  Vision? 0 1  Difficulty concentrating or making decisions? 0 0  Walking or climbing stairs? 1 1  Dressing or bathing? 0 0  Doing errands, shopping? 0 0  Preparing Food and eating ? N   Using the Toilet? N   In the past six months, have you accidently leaked urine? N   Do you have problems with loss of bowel control? N   Managing your Medications? N   Managing your Finances? N   Housekeeping or managing your Housekeeping? N     Patient Care Team: [name], DO as PCP - [facility], [facility]  Indicate  any recent Medical Services you may have received from other than [facility] providers in the past year (date may be approximate).     Assessment:   This is a routine wellness examination for Jaems.  Hearing/Vision screen Hearing Screening - Comments:: WEARS AIDS AT [place], BOTH EARS Vision Screening - Comments:: WEARS GLASSES- DR.WOODARD   Goals Addressed             This Visit's Progress    DIET - REDUCE SUGAR INTAKE         Depression Screen    05/09/2023   10:33 AM 05/06/2023    8:35 AM 04/15/2023   11:45 AM 02/27/2023   11:11 AM 07/26/2022    2:39 PM 04/12/2022    9:59 AM 02/27/2022    9:10 AM  PHQ 2/9 Scores  PHQ - 2 Score 0 0 0 0 0 0 2  PHQ- 9 Score 0 1  1 1  0 2    Fall Risk    05/09/2023   10:36 AM 05/06/2023    8:35 AM 04/15/2023   11:45 AM 02/27/2023   11:12 AM 07/26/2022    2:39 PM  Fall Risk   Falls in the past year? 0 0 0 0 0  Number falls in past yr: 0    0  Injury with Fall? 0   0 0  Risk for fall due to : No Fall Risks    No Fall Risks  Follow up Falls prevention discussed;Falls evaluation completed    Falls evaluation completed    MEDICARE RISK AT HOME: Medicare Risk at Home Any stairs in or around the home?: No If so, are there any without handrails?: No Home free of loose throw rugs in walkways, pet beds, electrical cords, etc?: Yes Adequate lighting in your home to reduce risk of falls?: Yes Life alert?: Yes Use of a cane, walker or w/c?: No Grab bars in the bathroom?: No Shower chair or bench in shower?: No Elevated toilet seat or a handicapped toilet?: No  TIMED UP AND GO:  Was the test performed?  No    Cognitive Function:        05/09/2023   10:37 AM 04/12/2022   10:04 AM 12/29/2018    9:36 AM 12/23/2017   10:07 AM 10/22/2016    1:59 PM  6CIT Screen  What Year? 0 points 0 points 0 points 0 points 0 points  What month? 0 points 0 points 0 points 0 points 0 points  What time? 0 points 0 points 0 points 0 points 0 points  Count back  from 20 0 points 0 points 0 points 0 points 0 points  Months in reverse 0 points 0 points 0 points 0 points 0 points  Repeat phrase 4 points 2 points 0 points 2 points 2 points  Total Score 4 points 2 points 0 points 2 points 2 points    Immunizations Immunization History  Administered Date(s) Administered   Fluad Quad(high Dose 65+) 02/27/2022, 02/27/2023   Influenza, High Dose Seasonal PF 03/20/2016   Influenza-Unspecified 03/23/2015, 03/11/2017, 02/26/2018   Moderna Sars-Covid-2 Vaccination 06/29/2019, 07/27/2019   Pneumococcal Conjugate-13 03/20/2016   Pneumococcal Polysaccharide-23 05/07/2013    TDAP status: Due, Education has been provided regarding the importance of this vaccine. Advised may receive this vaccine at local pharmacy or Health Dept. Aware to provide a copy of the vaccination record if obtained from local pharmacy or Health Dept. Verbalized acceptance and understanding.  Flu Vaccine status: Up to date  Pneumococcal vaccine status: Declined,  Education has been provided regarding the importance of this vaccine but patient still declined. Advised may receive this vaccine at local pharmacy or Health Dept. Aware to provide a copy of the vaccination record if obtained from local pharmacy or Health Dept. Verbalized acceptance and understanding.   Covid-19 vaccine status: Completed vaccines  Qualifies for Shingles Vaccine? No   Zostavax completed No   Shingrix Completed?: No.    Education has been provided regarding the importance of this vaccine. Patient has been advised to call insurance company to determine out of pocket expense if they have not yet received this vaccine. Advised may also receive vaccine at local pharmacy or Health Dept. Verbalized acceptance and understanding.  Screening Tests Health Maintenance  Topic Date Due   COVID-19 Vaccine (3 - 2024-25 season) 01/26/2023   Zoster Vaccines- Shingrix (1 of 2) 07/16/2023 (Originally 03/26/1984)   DTaP/Tdap/Td (1  - Tdap) 04/14/2024 (Originally 03/27/1953)   FOOT EXAM  07/26/2023   HEMOGLOBIN A1C  08/19/2023   OPHTHALMOLOGY EXAM  03/23/2024   Medicare Annual Wellness (AWV)  05/08/2024   Pneumonia Vaccine 28+ Years old  Completed   INFLUENZA VACCINE  Completed   HPV VACCINES  Aged Out    Health Maintenance  Health Maintenance Due  Topic Date Due   COVID-19 Vaccine (3 - 2024-25 season) 01/26/2023    Colorectal cancer screening: No longer required.   Lung Cancer Screening: (Low Dose CT Chest recommended if Age 20-80 years, 20 pack-year currently smoking OR have quit w/in 15years.) does not qualify.    Additional Screening:  Hepatitis C Screening: does not qualify; Completed NO  Vision Screening: Recommended annual ophthalmology exams for early detection of glaucoma and other disorders of the eye. Is the patient up to date with their annual eye exam?  Yes  Who is the provider or what is the name of the office in which the patient attends annual eye exams? DR.WOODARD If pt is not established with a provider, would they like to be referred to a provider to establish care? No .  Dental Screening: Recommended annual dental exams for proper oral hygiene  Diabetic Foot Exam: Diabetic Foot Exam: Completed 07/26/22  Community Resource Referral / Chronic Care Management: CRR required this visit?  No   CCM required this visit?  No     Plan:     I have personally reviewed and noted the following in the patient's chart:   Medical and social history Use of alcohol, tobacco or illicit drugs  Current medications and supplements including opioid prescriptions. Patient is not currently taking opioid prescriptions. Functional ability and status Nutritional status Physical activity Advanced directives List of other physicians Hospitalizations, surgeries, and ER visits in previous 12 months Vitals Screenings to include cognitive, depression, and falls Referrals and appointments  In addition,  I have reviewed and discussed with patient certain preventive protocols, quality metrics, and best practice recommendations. A written personalized care plan for preventive services as well as general preventive health recommendations were provided to patient.     Hal Hope, LPN   16/02/[id number]   After Visit Summary: (MyChart) Due to this being a telephonic visit, the after visit summary with patients personalized plan was offered to patient via MyChart   Nurse Notes: NONE

## 2023-05-09 NOTE — Patient Instructions (Addendum)
[name] , Thank you for taking time to come for your Medicare Wellness Visit. I appreciate your ongoing commitment to your health goals. Please review the following plan we discussed and let me know if I can assist you in the future.   Referrals/Orders/Follow-Ups/Clinician Recommendations: NONE  This is a list of the screening recommended for you and due dates:  Health Maintenance  Topic Date Due   COVID-19 Vaccine (3 - 2024-25 season) 01/26/2023   Zoster (Shingles) Vaccine (1 of 2) 07/16/2023*   DTaP/Tdap/Td vaccine (1 - Tdap) 04/14/2024*   Complete foot exam   07/26/2023   Hemoglobin A1C  08/19/2023   Eye exam for diabetics  03/23/2024   Medicare Annual Wellness Visit  05/08/2024   Pneumonia Vaccine  Completed   Flu Shot  Completed   HPV Vaccine  Aged Out  *Topic was postponed. The date shown is not the original due date.    Advanced directives: (ACP Link)Information on Advanced Care Planning can be found at [facility] Advance Health Care Directives Advance Health Care Directives ([email or web address]   Next Medicare Annual Wellness Visit scheduled for next year: Yes   05/14/24 @ 10:10 AM IN PERSON

## 2023-05-10 ENCOUNTER — Other Ambulatory Visit: Payer: Self-pay

## 2023-05-10 ENCOUNTER — Encounter
Admission: EM | Disposition: A | Discharge status: Home / Self Care | Payer: Self-pay | Source: Home / Self Care | Attending: Osteopathic Medicine

## 2023-05-10 ENCOUNTER — Encounter: Payer: Self-pay | Admitting: Internal Medicine

## 2023-05-10 ENCOUNTER — Inpatient Hospital Stay
Admission: EM | Admit: 2023-05-10 | Discharge: 2023-05-12 | DRG: 322 | Disposition: A | Discharge status: Home / Self Care | Payer: Medicare HMO | Attending: Obstetrics and Gynecology | Admitting: Obstetrics and Gynecology

## 2023-05-10 DIAGNOSIS — E785 Hyperlipidemia, unspecified: Secondary | ICD-10-CM | POA: Diagnosis not present

## 2023-05-10 DIAGNOSIS — I213 ST elevation (STEMI) myocardial infarction of unspecified site: Secondary | ICD-10-CM | POA: Diagnosis not present

## 2023-05-10 DIAGNOSIS — Z87891 Personal history of nicotine dependence: Secondary | ICD-10-CM

## 2023-05-10 DIAGNOSIS — E1142 Type 2 diabetes mellitus with diabetic polyneuropathy: Secondary | ICD-10-CM | POA: Diagnosis not present

## 2023-05-10 DIAGNOSIS — I493 Ventricular premature depolarization: Secondary | ICD-10-CM | POA: Diagnosis not present

## 2023-05-10 DIAGNOSIS — M79603 Pain in arm, unspecified: Secondary | ICD-10-CM | POA: Diagnosis not present

## 2023-05-10 DIAGNOSIS — I2582 Chronic total occlusion of coronary artery: Secondary | ICD-10-CM | POA: Diagnosis not present

## 2023-05-10 DIAGNOSIS — I2102 ST elevation (STEMI) myocardial infarction involving left anterior descending coronary artery: Secondary | ICD-10-CM | POA: Diagnosis not present

## 2023-05-10 DIAGNOSIS — I10 Essential (primary) hypertension: Secondary | ICD-10-CM | POA: Diagnosis present

## 2023-05-10 DIAGNOSIS — I2510 Atherosclerotic heart disease of native coronary artery without angina pectoris: Secondary | ICD-10-CM | POA: Diagnosis present

## 2023-05-10 DIAGNOSIS — Z7984 Long term (current) use of oral hypoglycemic drugs: Secondary | ICD-10-CM | POA: Diagnosis not present

## 2023-05-10 DIAGNOSIS — L7622 Postprocedural hemorrhage and hematoma of skin and subcutaneous tissue following other procedure: Secondary | ICD-10-CM | POA: Diagnosis not present

## 2023-05-10 DIAGNOSIS — Z79890 Hormone replacement therapy (postmenopausal): Secondary | ICD-10-CM | POA: Diagnosis not present

## 2023-05-10 DIAGNOSIS — Z8546 Personal history of malignant neoplasm of prostate: Secondary | ICD-10-CM

## 2023-05-10 DIAGNOSIS — R079 Chest pain, unspecified: Secondary | ICD-10-CM | POA: Diagnosis not present

## 2023-05-10 DIAGNOSIS — Y838 Other surgical procedures as the cause of abnormal reaction of the patient, or of later complication, without mention of misadventure at the time of the procedure: Secondary | ICD-10-CM | POA: Diagnosis not present

## 2023-05-10 DIAGNOSIS — I25119 Atherosclerotic heart disease of native coronary artery with unspecified angina pectoris: Secondary | ICD-10-CM | POA: Diagnosis not present

## 2023-05-10 DIAGNOSIS — I219 Acute myocardial infarction, unspecified: Secondary | ICD-10-CM | POA: Diagnosis not present

## 2023-05-10 DIAGNOSIS — R739 Hyperglycemia, unspecified: Secondary | ICD-10-CM | POA: Diagnosis not present

## 2023-05-10 DIAGNOSIS — Z79899 Other long term (current) drug therapy: Secondary | ICD-10-CM

## 2023-05-10 HISTORY — PX: LEFT HEART CATH AND CORONARY ANGIOGRAPHY: CATH118249

## 2023-05-10 HISTORY — PX: CORONARY/GRAFT ACUTE MI REVASCULARIZATION: CATH118305

## 2023-05-10 LAB — CG4 I-STAT (LACTIC ACID): Lactic Acid, Venous: 3 mmol/L (ref 0.5–1.9)

## 2023-05-10 LAB — POCT ACTIVATED CLOTTING TIME: Activated Clotting Time: 308 s

## 2023-05-10 SURGERY — CORONARY/GRAFT ACUTE MI REVASCULARIZATION
Anesthesia: Moderate Sedation

## 2023-05-10 MED ORDER — ONDANSETRON HCL 4 MG/2ML IJ SOLN: 4.0000 mg | Freq: Four times a day (QID) | INTRAMUSCULAR | Status: DC | PRN

## 2023-05-10 MED ORDER — ACETAMINOPHEN 325 MG PO TABS: 650.0000 mg | ORAL_TABLET | ORAL | Status: DC | PRN

## 2023-05-10 MED ORDER — LIDOCAINE HCL (PF) 1 % IJ SOLN
INTRAMUSCULAR | Status: DC | PRN
  Administered 2023-05-10: 5 mL

## 2023-05-10 MED ORDER — IOHEXOL 300 MG/ML  SOLN
INTRAMUSCULAR | Status: DC | PRN
  Administered 2023-05-10: 230 mL

## 2023-05-10 MED ORDER — ASPIRIN 81 MG PO CHEW
81.0000 mg | CHEWABLE_TABLET | Freq: Every day | ORAL | Status: DC
  Administered 2023-05-11 – 2023-05-12 (×2): 81 mg via ORAL
  Filled 2023-05-10 (×2): qty 1

## 2023-05-10 MED ORDER — HEPARIN SODIUM (PORCINE) 1000 UNIT/ML IJ SOLN
INTRAMUSCULAR | Status: AC
Start: 2023-05-10 — End: ?
  Filled 2023-05-10: qty 10

## 2023-05-10 MED ORDER — HEPARIN SODIUM (PORCINE) 1000 UNIT/ML IJ SOLN
INTRAMUSCULAR | Status: AC
  Filled 2023-05-10: qty 10

## 2023-05-10 MED ORDER — LIDOCAINE HCL 1 % IJ SOLN
INTRAMUSCULAR | Status: AC
  Filled 2023-05-10: qty 20

## 2023-05-10 MED ORDER — HEPARIN (PORCINE) IN NACL 1000-0.9 UT/500ML-% IV SOLN
INTRAVENOUS | Status: AC
  Filled 2023-05-10: qty 1000

## 2023-05-10 MED ORDER — ROSUVASTATIN CALCIUM 10 MG PO TABS
40.0000 mg | ORAL_TABLET | Freq: Every day | ORAL | Status: DC
Start: 2023-05-11 — End: 2023-05-12
  Administered 2023-05-11 – 2023-05-12 (×2): 40 mg via ORAL
  Filled 2023-05-10: qty 4
  Filled 2023-05-10: qty 2
  Filled 2023-05-10: qty 4

## 2023-05-10 MED ORDER — CHLORHEXIDINE GLUCONATE CLOTH 2 % EX PADS
6.0000 | MEDICATED_PAD | Freq: Every day | CUTANEOUS | Status: DC
  Administered 2023-05-11 – 2023-05-12 (×2): 6 via TOPICAL

## 2023-05-10 MED ORDER — TICAGRELOR 90 MG PO TABS
90.0000 mg | ORAL_TABLET | Freq: Two times a day (BID) | ORAL | Status: DC
  Administered 2023-05-11 – 2023-05-12 (×3): 90 mg via ORAL
  Filled 2023-05-10 (×3): qty 1

## 2023-05-10 MED ORDER — TICAGRELOR 90 MG PO TABS
ORAL_TABLET | ORAL | Status: DC | PRN
  Administered 2023-05-10: 180 mg via ORAL

## 2023-05-10 MED ORDER — SODIUM CHLORIDE 0.9 % WEIGHT BASED INFUSION: 1.0000 mL/kg/h | INTRAVENOUS | Status: AC

## 2023-05-10 MED ORDER — HEPARIN SODIUM (PORCINE) 1000 UNIT/ML IJ SOLN
INTRAMUSCULAR | Status: DC | PRN
  Administered 2023-05-10: 4000 [IU] via INTRAVENOUS
  Administered 2023-05-10: 8000 [IU] via INTRAVENOUS

## 2023-05-10 MED ORDER — VERAPAMIL HCL 2.5 MG/ML IV SOLN
INTRAVENOUS | Status: DC | PRN
  Administered 2023-05-10: 2.5 mg via INTRAVENOUS

## 2023-05-10 MED ORDER — TICAGRELOR 90 MG PO TABS
ORAL_TABLET | ORAL | Status: AC
  Filled 2023-05-10: qty 2

## 2023-05-10 MED ORDER — HEPARIN (PORCINE) IN NACL 2000-0.9 UNIT/L-% IV SOLN
INTRAVENOUS | Status: DC | PRN
  Administered 2023-05-10: 1000 mL

## 2023-05-10 MED ORDER — METOPROLOL SUCCINATE ER 50 MG PO TB24
25.0000 mg | ORAL_TABLET | Freq: Every day | ORAL | Status: DC
  Administered 2023-05-11: 25 mg via ORAL
  Filled 2023-05-10: qty 1

## 2023-05-10 MED ORDER — LOSARTAN POTASSIUM 50 MG PO TABS
25.0000 mg | ORAL_TABLET | Freq: Every day | ORAL | Status: DC
  Administered 2023-05-11: 25 mg via ORAL
  Filled 2023-05-10 (×2): qty 1

## 2023-05-10 MED ORDER — HEPARIN (PORCINE) IN NACL 1000-0.9 UT/500ML-% IV SOLN
INTRAVENOUS | Status: AC
  Filled 2023-05-10: qty 500

## 2023-05-10 MED ORDER — VERAPAMIL HCL 2.5 MG/ML IV SOLN
INTRAVENOUS | Status: AC
  Filled 2023-05-10: qty 2

## 2023-05-10 SURGICAL SUPPLY — 21 items
BALLN TREK RX 2.25X15 (BALLOONS) ×1
BALLN ~~LOC~~ EUPHORA RX 2.25X8 (BALLOONS) ×1
BALLOON TREK RX 2.25X15 (BALLOONS) IMPLANT
BALLOON ~~LOC~~ EUPHORA RX 2.25X8 (BALLOONS) IMPLANT
CATH 5FR JL3.5 JR4 ANG PIG MP (CATHETERS) IMPLANT
CATH VISTA GUIDE 6FR XB3.5 (CATHETERS) IMPLANT
CATH VISTA GUIDE 6FRXBLAD3.5SH (CATHETERS) IMPLANT
DEVICE RAD TR BAND REGULAR (VASCULAR PRODUCTS) IMPLANT
DRAPE BRACHIAL (DRAPES) IMPLANT
GLIDESHEATH SLEND SS 6F .021 (SHEATH) IMPLANT
GUIDEWIRE INQWIRE 1.5J.035X260 (WIRE) IMPLANT
INQWIRE 1.5J .035X260CM (WIRE) ×1
KIT ENCORE 26 ADVANTAGE (KITS) IMPLANT
PACK CARDIAC CATH (CUSTOM PROCEDURE TRAY) ×1 IMPLANT
PAD ELECT DEFIB RADIOL ZOLL (MISCELLANEOUS) IMPLANT
PROTECTION STATION PRESSURIZED (MISCELLANEOUS) ×1
SET ATX-X65L (MISCELLANEOUS) IMPLANT
STATION PROTECTION PRESSURIZED (MISCELLANEOUS) IMPLANT
STENT ONYX FRONTIER 2.25X18 (Permanent Stent) IMPLANT
TUBING CIL FLEX 10 FLL-RA (TUBING) IMPLANT
WIRE G HI TQ BMW 190 (WIRE) IMPLANT

## 2023-05-10 NOTE — ED Provider Notes (Signed)
[facility] Provider Note    Event Date/Time   First MD Initiated Contact with Patient 05/10/23 2146     (approximate)   History   Code STEMI   HPI  ZAION WEERS is a 89 year old male with history of T2DM, HTN presenting to the emerged part for evaluation of chest pain.  A few hours hours prior to presentation, patient had onset of worsening chest pain described as a tightness radiating to his left arm.  No known cardiac history.  Initially rated pain as a 9 out of 10.  He was given 3 sprays of nitroglycerin and Nitropaste with EMS as well as receiving a total of 324 of aspirin prior to presentation.  At the time of my initial evaluation he reports significant improvement in his pain, rating as a 1 out of 10.     Physical Exam   Triage Vital Signs: ED Triage Vitals  Encounter Vitals Group     BP 05/10/23 2139 (!) 136/99     Systolic BP Percentile --      Diastolic BP Percentile --      Pulse Rate 05/10/23 2139 99     Resp 05/10/23 2139 16     Temp 05/10/23 2139 97.9 F (36.6 C)     Temp Source 05/10/23 2139 Oral     SpO2 05/10/23 2135 95 %     Weight 05/10/23 2141 178 lb 1.6 oz (80.8 kg)     Height 05/10/23 2141 5\' 10"  (1.778 m)     Head Circumference --      Peak Flow --      Pain Score 05/10/23 2140 1     Pain Loc --      Pain Education --      Exclude from Growth Chart --     Most recent vital signs: Vitals:   05/10/23 2248 05/10/23 2253  BP: 137/84 133/81  Pulse: 78 80  Resp: 17 18  Temp:  98 F (36.7 C)  SpO2: 94% 94%     General: Awake, interactive  CV:  Regular rate, good peripheral perfusion.  Resp:  Unlabored respirations, lungs good auscultation Abd:  Nondistended.  Neuro:  Symmetric facial movement, fluid speech   ED Results / Procedures / Treatments   Labs (all labs ordered are listed, but only abnormal results are displayed) Labs Reviewed  CG4 I-STAT (LACTIC ACID) - Abnormal; Notable for the following  components:      Result Value   Lactic Acid, Venous 3.0 (*)    All other components within normal limits  MRSA NEXT GEN BY PCR, NASAL  BASIC METABOLIC PANEL  CBC  LIPOPROTEIN A (LPA)  POCT ACTIVATED CLOTTING TIME     EKG EKG independently reviewed interpreted by myself (ER attending) demonstrates:  EKG demonstrates sinus tachycardia at a rate of 102, PR 176, QRS 110, QTc 446, frequent ectopy noted, ST changes without STEMI Repeat EKG obtained at 2142 demonstrating ventricular bigeminy, persistent ST changes without clear STEMI EMS EKG reviewed demonstrating prominent anterior ST changes concerning for STEMI  RADIOLOGY Imaging independently reviewed and interpreted by myself demonstrates:    PROCEDURES:  Critical Care performed: No  Procedures   MEDICATIONS ORDERED IN ED: Medications  acetaminophen (TYLENOL) tablet 650 mg (has no administration in time range)  ondansetron (ZOFRAN) injection 4 mg (has no administration in time range)  0.9% sodium chloride infusion (has no administration in time range)  aspirin chewable tablet 81 mg (has no administration in  time range)  ticagrelor (BRILINTA) tablet 90 mg (has no administration in time range)  Chlorhexidine Gluconate Cloth 2 % PADS 6 each (has no administration in time range)  rosuvastatin (CRESTOR) tablet 40 mg (has no administration in time range)  metoprolol succinate (TOPROL-XL) 24 hr tablet 25 mg (has no administration in time range)  losartan (COZAAR) tablet 25 mg (has no administration in time range)     IMPRESSION / MDM / ASSESSMENT AND PLAN / ED COURSE  I reviewed the triage vital signs and the nursing notes.  Differential diagnosis includes, but is not limited to, STEMI, NSTEMI, lower vision PE, dissection with clinical history  Patient's presentation is most consistent with acute presentation with potential threat to life or bodily function.  89 year old male presenting with chest pain as a code STEMI.  EMS  EKG reviewed concerning for anterior STEMI.  EKGs here did not meet STEMI criteria and pain was improved, but clinical history is concerning for ACS. Patient was evaluated by Dr. Juliann Pares with cardiology who did recommend proceeding to the Cath Lab.  Received aspirin with EMS.  Dr. Juliann Pares did recommend deferring heparin that patient could be more rapidly transported to the Cath Lab.  Further management per cardiology team.     FINAL CLINICAL IMPRESSION(S) / ED DIAGNOSES   Final diagnoses:  ST elevation myocardial infarction involving left anterior descending (LAD) coronary artery (HCC)     Rx / DC Orders   ED Discharge Orders          Ordered    AMB Referral to Cardiac Rehabilitation - Phase II        05/10/23 2257             Note:  This document was prepared using Dragon voice recognition software and may include unintentional dictation errors.   Trinna Post, MD 05/10/23 2350

## 2023-05-10 NOTE — ED Triage Notes (Signed)
Pt arrives from home via [facility], C/O CP of 10 radiating to left arm.  No cardiac HX.  Denies blood thinners.ST Elevation noted.

## 2023-05-10 NOTE — Progress Notes (Signed)
   05/10/23 2300  Spiritual Encounters  Type of Visit Initial  Care provided to: Pt and family  Referral source Code page  Reason for visit Code  OnCall Visit Yes   Chaplain responded to Code STEMI and provided assistance to patients wife.

## 2023-05-10 NOTE — ED Notes (Signed)
Stemi called in the field @ 2120

## 2023-05-10 NOTE — CV Procedure (Signed)
Brief post STEMI note Code STEMI from the field ST elevation anteriorly Patient was improved by time he came to the emergency room EKG was improved We elected to take the patient to the Cath Lab since initial EKG was concerning  Patient received heparin aspirin Brilinta  We went right radial approach  6 French sheath placed in the right radial artery  Left ventriculogram Mildly reduced left ventricular function anterior apical hypokinesis mild left ventricular enlargement ejection fraction around 45 to 50%  Coronaries Left main large minor irregularities LAD large 95% mid LAD TIMI II flow Large diagonal 1 moderate disease 50 to 75% lesions diffusely Circumflex very large moderate disease proximal to mid CTO distally collaterals left to left Left dominant system RCA small nondominant moderate disease  Intervention Successful PCI and stent to mid LAD 2.25 x 18 mm frontier Onyx to 12 atm Postdilatation with a 2.25 x 8 mm [place] neo to 16 atm Lesion reduced from 95 down to 0 TIMI-3 flow restored from TIMI II Patient tolerated procedure well No complications Patient to be maintained on aspirin Brilinta We started metoprolol losartan and Crestor Patient was transferred to ICU Anticipated discharge 48 to 72 hours Case discussed with hospitalist

## 2023-05-10 NOTE — ED Notes (Signed)
Pt transported to the cath lab at this time.

## 2023-05-11 ENCOUNTER — Inpatient Hospital Stay
Admit: 2023-05-11 | Discharge: 2023-05-11 | Disposition: A | Discharge status: Home / Self Care | Payer: Medicare HMO | Attending: Internal Medicine | Admitting: Internal Medicine

## 2023-05-11 DIAGNOSIS — E785 Hyperlipidemia, unspecified: Secondary | ICD-10-CM

## 2023-05-11 DIAGNOSIS — I10 Essential (primary) hypertension: Secondary | ICD-10-CM

## 2023-05-11 DIAGNOSIS — E1142 Type 2 diabetes mellitus with diabetic polyneuropathy: Secondary | ICD-10-CM

## 2023-05-11 DIAGNOSIS — I213 ST elevation (STEMI) myocardial infarction of unspecified site: Secondary | ICD-10-CM | POA: Diagnosis not present

## 2023-05-11 LAB — ECHOCARDIOGRAM COMPLETE
AR max vel: 2.07 cm2
AV Peak grad: 4.9 mm[Hg]
Ao pk vel: 1.11 m/s
Area-P 1/2: 3.37 cm2
Calc EF: 55 %
Height: 70 in
P 1/2 time: 780 ms
S' Lateral: 3 cm
Single Plane A2C EF: 59.5 %
Single Plane A4C EF: 49.2 %
Weight: 2849.6 [oz_av]

## 2023-05-11 LAB — BASIC METABOLIC PANEL
Anion gap: 9 (ref 5–15)
BUN: 34 mg/dL — ABNORMAL HIGH (ref 8–23)
CO2: 24 mmol/L (ref 22–32)
Calcium: 9.2 mg/dL (ref 8.9–10.3)
Chloride: 100 mmol/L (ref 98–111)
Creatinine, Ser: 0.95 mg/dL (ref 0.61–1.24)
GFR, Estimated: >60 mL/min (ref >60)
Glucose, Bld: 240 mg/dL — ABNORMAL HIGH (ref 70–99)
Potassium: 3.9 mmol/L (ref 3.5–5.1)
Sodium: 133 mmol/L — ABNORMAL LOW (ref 135–145)

## 2023-05-11 LAB — TROPONIN I (HIGH SENSITIVITY): Troponin I (High Sensitivity): >24000 ng/L (ref <18)

## 2023-05-11 LAB — CBC
HCT: 43.6 % (ref 39.0–52.0)
Hemoglobin: 15.3 g/dL (ref 13.0–17.0)
MCH: 29.8 pg (ref 26.0–34.0)
MCHC: 35.1 g/dL (ref 30.0–36.0)
MCV: 84.8 fL (ref 80.0–100.0)
Platelets: 185 10*3/uL (ref 150–400)
RBC: 5.14 MIL/uL (ref 4.22–5.81)
RDW: 13.1 % (ref 11.5–15.5)
WBC: 12.7 10*3/uL — ABNORMAL HIGH (ref 4.0–10.5)
nRBC: 0 % (ref 0.0–0.2)

## 2023-05-11 LAB — CARDIAC CATHETERIZATION: Cath EF Quantitative: 45 %

## 2023-05-11 LAB — MRSA NEXT GEN BY PCR, NASAL: MRSA by PCR Next Gen: NOT DETECTED

## 2023-05-11 MED ORDER — METOPROLOL SUCCINATE ER 25 MG PO TB24
12.5000 mg | ORAL_TABLET | Freq: Every day | ORAL | Status: DC
Start: 2023-05-12 — End: 2023-05-12
  Filled 2023-05-11: qty 0.5

## 2023-05-11 MED ORDER — MONTELUKAST SODIUM 10 MG PO TABS
10.0000 mg | ORAL_TABLET | Freq: Every day | ORAL | Status: DC
  Administered 2023-05-11 – 2023-05-12 (×2): 10 mg via ORAL
  Filled 2023-05-11 (×2): qty 1

## 2023-05-11 MED ORDER — LOSARTAN POTASSIUM 25 MG PO TABS
12.5000 mg | ORAL_TABLET | Freq: Every day | ORAL | Status: DC
  Filled 2023-05-11: qty 0.5

## 2023-05-11 MED ORDER — LISINOPRIL 5 MG PO TABS: 5.0000 mg | ORAL_TABLET | Freq: Every day | ORAL | Status: DC

## 2023-05-11 MED ORDER — PREGABALIN 75 MG PO CAPS
75.0000 mg | ORAL_CAPSULE | Freq: Two times a day (BID) | ORAL | Status: DC
Start: 2023-05-11 — End: 2023-05-12
  Administered 2023-05-11 – 2023-05-12 (×3): 75 mg via ORAL
  Filled 2023-05-11 (×3): qty 1

## 2023-05-11 MED ORDER — ORAL CARE MOUTH RINSE: 15.0000 mL | OROMUCOSAL | Status: DC | PRN

## 2023-05-11 MED ORDER — EZETIMIBE 10 MG PO TABS
10.0000 mg | ORAL_TABLET | Freq: Every day | ORAL | Status: DC
  Administered 2023-05-11 – 2023-05-12 (×2): 10 mg via ORAL
  Filled 2023-05-11 (×2): qty 1

## 2023-05-11 MED ORDER — COLCHICINE 0.6 MG PO TABS: 0.6000 mg | ORAL_TABLET | ORAL | Status: DC | PRN

## 2023-05-11 NOTE — Assessment & Plan Note (Signed)
-   The patient will be placed on supplemental coverage with NovoLog. - We will continue Lyrica.

## 2023-05-11 NOTE — Progress Notes (Addendum)
After given his scheduled dose of metoprolol, Pt has gradually started to brady down into the 50s from the 70s, BP systolic from 140s to 90s. Pt states he feels a little short of breath so O2 at 3L placed on pt with relief. He also states he has some mild chest tightness, but nothing like what he felt earlier. Dr. Juliann Pares notified via secure chat, no new orders. Continue to monitor.

## 2023-05-11 NOTE — Progress Notes (Signed)
  Echocardiogram 2D Echocardiogram has been performed.  [name] 05/11/2023, 8:34 AM

## 2023-05-11 NOTE — Assessment & Plan Note (Signed)
-   He will be placed on Cozaar and Toprol-XL.

## 2023-05-11 NOTE — Progress Notes (Signed)
Sent Dr. Juliann Pares secure chat and made him aware that RN held patient's morning dose of metoprolol due to patient being bradycardic rate 48-60s. MD read chat but did not respond.

## 2023-05-11 NOTE — Progress Notes (Signed)
Paged Dr. Juliann Pares to make him aware of Trop > 24,000 and that Patient c/o chest tightness 5/10 and that TR band still in place because oozing overnight when night RN attempted to remove. Waiting for response.

## 2023-05-11 NOTE — Hospital Course (Addendum)
HPI: [name] is a 89 y.o. male with medical history significant for type 2 diabetes mellitus, hypertension, dyslipidemia, peripheral neuropathy and prostate cancer, who presented to the emergency room with acute onset of chest pain that started few hours before his presentation to the ER - tightness with radiation to his left arm and graded 9/10 in severity.  He was given 3 sprays of nitroglycerin and and had a Nitropaste placed by EMS as well as 4 baby aspirin. The patient came in as a code STEMI.   Hospital course / significant events:  12/14: code STEMI to ED --> cardiac catheterization and PCI.  He was `found to have a 95% LAD lesion that was stented. Admitted to hospitalist service w/ cardiology consult.  12/15: oozing from radial cath access overnight, pressure bandage remaining in place, echo pending, cardiology clearance pending   Consultants:  Cardiology   Procedures/Surgeries: 05/10/2023 - Cardiac cath w/ PCI       ASSESSMENT & PLAN:   STEMI (ST elevation myocardial infarction)  underwent PCI and LAD stent  High-dose statin with Crestor  beta-blocker with Toprol-XL  ARB with lisinopril  aspirin + Brilinta.   Cardiology following - clearance prior to discharge     Echo pending                         Bleeding from radial cath access site overnight Continuing pressure bandages   Dyslipidemia Statin + ezetimibe    Type 2 diabetes mellitus with peripheral neuropathy (HCC) Hold home metformin supplemental coverage with NovoLog. continue Lyrica.   HTN Metoprolol and lisinopril      overweight based on BMI: Body mass index is 25.55 kg/m.  Underweight - under 18  overweight - 25 to 29 obese - 30 or more Class 1 obesity: BMI of 30.0 to 34 Class 2 obesity: BMI of 35.0 to 39 Class 3 obesity: BMI of 40.0 to 49 Super Morbid Obesity: BMI 50-59 Super-super Morbid Obesity: BMI 60+ Significantly low or high BMI is associated with higher medical risk.  Weight  management advised as adjunct to other disease management and risk reduction treatments    DVT prophylaxis: SCD IV fluids: no continuous IV fluids  Nutrition: cardiac diet  Central lines / invasive devices: none  Code Status: FULL CODE ACP documentation reviewed:  none on file in [facility]  TOC needs: none at this time Barriers to dispo / significant pending items: Echo, cardiac clearance

## 2023-05-11 NOTE — Progress Notes (Signed)
TR band deflated to 8ml, stll oozing. Left at 8

## 2023-05-11 NOTE — H&P (Addendum)
[place]   PATIENT NAME: [name]    MR#:  [id number]  DATE OF BIRTH:  Dec 15, 1933  DATE OF ADMISSION:  05/10/2023  PRIMARY CARE PHYSICIAN: [name], DO   Patient is coming from: Home  REQUESTING/REFERRING PHYSICIAN: Marlana Latus, MD  CHIEF COMPLAINT:   Chief Complaint  Patient presents with   Code STEMI    HISTORY OF PRESENT ILLNESS:  [name] is a 89 y.o. male with medical history significant for type 2 diabetes mellitus, hypertension, dyslipidemia, peripheral neuropathy and prostate cancer, who presented to the emergency room with acute onset of chest pain that started few hours before his presentation to the ER.  He describes it as tightness with radiation to his left arm and graded 9/10 in severity.  He was given 3 sprays of nitroglycerin and and had a Nitropaste placed by EMS as well as 4 baby aspirin.  His pain has improved to 1/10 in severity.  No cough or wheezing or hemoptysis.  No leg pain or edema or recent travels or surgeries.  No bleeding diathesis.  No headache or dizziness or blurred vision.  No fever, oliguria or hematuria or flank pain.  No paresthesias or focal muscle weakness.  The patient came in as a code STEMI.  ED Course: When he came to the ER, BP was 136/99 with otherwise normal vital signs.  Labs are currently pending.  Lactic acid with 3.  EKG as reviewed by me : EKG showed mild ST segment elevation in V1 and V2 with Q waves in V3, PVCs and inferior ST segment depression with probable left atrial enlargement with sinus tachycardia with rate of 102..Repeat EKG showed similar findings with more PVCs in a bigeminy pattern with sinus tachycardia with a rate of 108 Imaging: None yet.  The patient was taken to the Cath Lab by Dr. Juliann Pares, underwent cardiac catheterization and PCI.  He was `found to have a 95% LAD lesion that was stented.  His EF was about 40 to 45% per his report.  He will be admitted to an ICU bed for  further evaluation and management. PAST MEDICAL HISTORY:   Past Medical History:  Diagnosis Date   Diabetes mellitus without complication (HCC)    Hyperlipidemia    Hypertension    Neuropathy    Prostate cancer (HCC)     PAST SURGICAL HISTORY:   Past Surgical History:  Procedure Laterality Date   CATARACT EXTRACTION W/PHACO Left 04/24/2015   Procedure: CATARACT EXTRACTION PHACO AND INTRAOCULAR LENS PLACEMENT (IOC);  Surgeon: Sallee Lange, MD;  Location: [facility] ORS;  Service: Ophthalmology;  Laterality: Left;  [place] 01:12 AP% 25.7 CDE 28.30 fluid pack lot #[id number]   COLONOSCOPY      SOCIAL HISTORY:   Social History   Tobacco Use   Smoking status: Former    Current packs/day: 0.00    Types: Cigarettes    Quit date: 05/27/1978    Years since quitting: 44.9   Smokeless tobacco: Never  Substance Use Topics   Alcohol use: No    Alcohol/week: 0.0 standard drinks of alcohol    FAMILY HISTORY:   Family History  Problem Relation Age of Onset   Stroke Mother    Cancer Mother        colon cancer   COPD Father    COPD Brother    Hearing loss Son     DRUG ALLERGIES:  No Known Allergies  REVIEW OF SYSTEMS:   ROS As  per history of present illness. All pertinent systems were reviewed above. Constitutional, HEENT, cardiovascular, respiratory, GI, GU, musculoskeletal, neuro, psychiatric, endocrine, integumentary and hematologic systems were reviewed and are otherwise negative/unremarkable except for positive findings mentioned above in the HPI.   MEDICATIONS AT HOME:   Prior to Admission medications   Medication Sig Start Date End Date Taking? Authorizing Provider  azelastine (ASTELIN) 0.1 % nasal spray Place 2 sprays into both nostrils 2 (two) times daily. Use in each nostril as directed Patient taking differently: Place 2 sprays into both nostrils 2 (two) times daily. Use in each nostril as directed PRN 02/27/22   [name], DO  colchicine 0.6 MG tablet  If acute flare only - Day 1: take 2 pills first day, then repeat 1 pill within two hours. Day 2 - take 1 pill daily for up to max 7 days or stop when gout flare resolves. 08/18/20   Karamalegos, Netta Neat, DO  diclofenac Sodium (VOLTAREN) 1 % GEL Apply 2 g topically 4 (four) times daily as needed (elbow bursitis). 01/23/22   Karamalegos, Netta Neat, DO  ezetimibe (ZETIA) 10 MG tablet Take 1 tablet (10 mg total) by mouth daily. 02/27/23   Karamalegos, Netta Neat, DO  fluticasone (FLONASE) 50 MCG/ACT nasal spray Place 2 sprays into both nostrils daily. Use for 4-6 weeks then stop and use seasonally or as needed. 01/24/21   Karamalegos, Netta Neat, DO  glucosamine-chondroitin 500-400 MG tablet Take 1 tablet by mouth 3 (three) times daily as needed. Patient not taking: Reported on 05/09/2023    [provider]  lisinopril (ZESTRIL) 5 MG tablet Take 1 tablet (5 mg total) by mouth daily. 02/27/23   Karamalegos, Netta Neat, DO  metFORMIN (GLUCOPHAGE-XR) 500 MG 24 hr tablet Take 1 tablet (500 mg total) by mouth daily with breakfast. 02/27/23   Karamalegos, Netta Neat, DO  montelukast (SINGULAIR) 10 MG tablet Take 10 mg by mouth daily. 04/14/23   [provider]  polyethylene glycol powder (GLYCOLAX/MIRALAX) 17 GM/SCOOP powder Take 17-34 g by mouth daily as needed for moderate constipation or mild constipation. 05/06/23   Karamalegos, Netta Neat, DO  predniSONE (DELTASONE) 20 MG tablet Take daily with food. Start with 60mg  (3 pills) x 2 days, then reduce to 40mg  (2 pills) x 2 days, then 20mg  (1 pill) x 3 days 05/06/23   [name], DO  pregabalin (LYRICA) 75 MG capsule Take 1 capsule (75 mg total) by mouth 2 (two) times daily. 10/28/22   Karamalegos, Netta Neat, DO      VITAL SIGNS:  Blood pressure 104/65, pulse 70, temperature 98 F (36.7 C), resp. rate 17, height 5\' 10"  (1.778 m), weight 80.8 kg, SpO2 94%.  PHYSICAL EXAMINATION:  Physical Exam  GENERAL:  89  y.o.-year-old Caucasian male patient lying in the bed with no acute distress.  EYES: Pupils equal, round, reactive to light and accommodation. No scleral icterus. Extraocular muscles intact.  HEENT: Head atraumatic, normocephalic. Oropharynx and nasopharynx clear.  NECK:  Supple, no jugular venous distention. No thyroid enlargement, no tenderness.  LUNGS: Normal breath sounds bilaterally, no wheezing, rales,rhonchi or crepitation. No use of accessory muscles of respiration.  CARDIOVASCULAR: Regular rate and rhythm, S1, S2 normal. No murmurs, rubs, or gallops.  ABDOMEN: Soft, nondistended, nontender. Bowel sounds present. No organomegaly or mass.  EXTREMITIES: No pedal edema, cyanosis, or clubbing.  NEUROLOGIC: Cranial nerves II through XII are intact. Muscle strength 5/5 in all extremities. Sensation intact. Gait not checked.  PSYCHIATRIC: The  patient is alert and oriented x 3.  Normal affect and good eye contact. SKIN: No obvious rash, lesion, or ulcer.   LABORATORY PANEL:   CBC No results for input(s): "WBC", "HGB", "HCT", "PLT" in the last 168 hours. ------------------------------------------------------------------------------------------------------------------  Chemistries  No results for input(s): "NA", "K", "CL", "CO2", "GLUCOSE", "BUN", "CREATININE", "CALCIUM", "MG", "AST", "ALT", "ALKPHOS", "BILITOT" in the last 168 hours.  Invalid input(s): "GFRCGP" ------------------------------------------------------------------------------------------------------------------  Cardiac Enzymes No results for input(s): "TROPONINI" in the last 168 hours. ------------------------------------------------------------------------------------------------------------------  RADIOLOGY:  No results found.    IMPRESSION AND PLAN:  Assessment and Plan: * STEMI (ST elevation myocardial infarction) (HCC) - The patient is admitted to an ICU bed post cath for acute anterior STEMI. - He underwent PCI  and LAD stent for 95% mid LAD lesion. - He was found to have a large diagonal 1 moderate disease there is 50 to 75% lesions diffusely. - High-dose statin therapy with Crestor as well as beta-blocker therapy with Toprol-XL and ARB therapy in addition to aspirin and Brilinta.     Dyslipidemia - He will be placed on high-dose statin therapy as mentioned above and Zetia.  Type 2 diabetes mellitus with peripheral neuropathy (HCC) - The patient will be placed on supplemental coverage with NovoLog. - We will continue Lyrica.  Essential hypertension - He will be placed on Cozaar and Toprol-XL.   DVT prophylaxis: Per cardiology. Advanced Care Planning:  Code Status: full code. Family Communication:  The plan of care was discussed in details with the patient (and family). I answered all questions. The patient agreed to proceed with the above mentioned plan. Further management will depend upon hospital course. Disposition Plan: Back to previous home environment Consults called: Cardiology All the records are reviewed and case discussed with ED provider.  Status is: Inpatient  At the time of the admission, it appears that the appropriate admission status for this patient is inpatient.  This is judged to be reasonable and necessary in order to provide the required intensity of service to ensure the patient's safety given the presenting symptoms, physical exam findings and initial radiographic and laboratory data in the context of comorbid conditions.  The patient requires inpatient status due to high intensity of service, high risk of further deterioration and high frequency of surveillance required.  I certify that at the time of admission, it is my clinical judgment that the patient will require inpatient hospital care extending more than 2 midnights.                            Dispo: The patient is from: Home              Anticipated d/c is to: Home              Patient currently is not  medically stable to d/c.              Difficult to place patient: No  Hannah Beat M.D on 05/11/2023 at 1:01 AM  [facility]   From 7 PM-7 AM, contact night-coverage [email or web address]  CC: Primary care physician; [name], DO

## 2023-05-11 NOTE — Plan of Care (Signed)

## 2023-05-11 NOTE — Progress Notes (Signed)
Pts radial site continues to bleed. TR band reinflated to 11 ml air

## 2023-05-11 NOTE — Progress Notes (Signed)
eLink Physician-Brief Progress Note Patient Name: [name] DOB: 1933/11/14 MRN: [id number]   Date of Service  05/11/2023  HPI/Events of Note  89 year old male with a history of diabetes mellitus, essential hypertension who initially presented with chest pain which was responsive to nitrates found to have an anterior STEMI taken to the Cath Lab.  She received a mid LAD PCI and was started on dual antiplatelet therapy, metoprolol, and losartan.  Transferred to ICU for postintervention observation  Laboratory studies are pending, initial lactate of 3.  EKG with anterior ST elevations and lateral depressions  eICU Interventions  Status post successful PCI, maintain dual antiplatelet therapy with aspirin/Brilinta  Obtain echocardiogram, initiate goal-directed medical therapy per cardiology  Trend morning labs with ongoing hydration.  DVT prophylaxis with heparin subcutaneous GI prophylaxis not indicated     Intervention Category Evaluation Type: New Patient Evaluation  [name] 05/11/2023, 12:56 AM

## 2023-05-11 NOTE — Assessment & Plan Note (Addendum)
-   The patient is admitted to an ICU bed post cath for acute anterior STEMI. - He underwent PCI and LAD stent for 95% mid LAD lesion. - He was found to have a large diagonal 1 moderate disease there is 50 to 75% lesions diffusely. - High-dose statin therapy with Crestor as well as beta-blocker therapy with Toprol-XL and ARB therapy in addition to aspirin and Brilinta.

## 2023-05-11 NOTE — Progress Notes (Signed)
PROGRESS NOTE    [name]   ZOX:[id number] DOB: 1933-07-23  DOA: 05/10/2023 Date of Service: 05/11/23 which is hospital day 1  PCP: [name], DO    HPI: [name] is a 89 y.o. male with medical history significant for type 2 diabetes mellitus, hypertension, dyslipidemia, peripheral neuropathy and prostate cancer, who presented to the emergency room with acute onset of chest pain that started few hours before his presentation to the ER - tightness with radiation to his left arm and graded 9/10 in severity.  He was given 3 sprays of nitroglycerin and and had a Nitropaste placed by EMS as well as 4 baby aspirin. The patient came in as a code STEMI.   Hospital course / significant events:  12/14: code STEMI to ED --> cardiac catheterization and PCI.  He was `found to have a 95% LAD lesion that was stented. Admitted to hospitalist service w/ cardiology consult.  12/15: oozing from radial cath access overnight, pressure bandage remaining in place, echo pending, cardiology clearance pending   Consultants:  Cardiology   Procedures/Surgeries: 05/10/2023 - Cardiac cath w/ PCI       ASSESSMENT & PLAN:   STEMI (ST elevation myocardial infarction)  underwent PCI and LAD stent  High-dose statin with Crestor  beta-blocker with Toprol-XL  ARB with lisinopril  aspirin + Brilinta.   Cardiology following - clearance prior to discharge     Echo pending                         Bleeding from radial cath access site overnight Continuing pressure bandages   Dyslipidemia Statin + ezetimibe    Type 2 diabetes mellitus with peripheral neuropathy (HCC) Hold home metformin supplemental coverage with NovoLog. continue Lyrica.   HTN Metoprolol and lisinopril      overweight based on BMI: Body mass index is 25.55 kg/m.  Underweight - under 18  overweight - 25 to 29 obese - 30 or more Class 1 obesity: BMI of 30.0 to 34 Class 2 obesity: BMI of 35.0 to  39 Class 3 obesity: BMI of 40.0 to 49 Super Morbid Obesity: BMI 50-59 Super-super Morbid Obesity: BMI 60+ Significantly low or high BMI is associated with higher medical risk.  Weight management advised as adjunct to other disease management and risk reduction treatments    DVT prophylaxis: SCD IV fluids: no continuous IV fluids  Nutrition: cardiac diet  Central lines / invasive devices: none  Code Status: FULL CODE ACP documentation reviewed:  none on file in [facility]  TOC needs: none at this time Barriers to dispo / significant pending items: Echo, cardiac clearance              Subjective / Brief ROS:  Patient reports feeling okay today NO further chest pain  Denies SOB.  Pain controlled.  Denies new weakness.  Tolerating diet.  Reports no concerns w/ urination/defecation.   Family Communication: family at bedside on rounds     Objective Findings:  Vitals:   05/11/23 1200 05/11/23 1300 05/11/23 1309 05/11/23 1400  BP: 104/65 113/70  (!) 107/58  Pulse: (!) 52 (!) 51 (!) 54 (!) 55  Resp: 19 (!) 21  13  Temp: 98 F (36.7 C)     TempSrc: Oral     SpO2: 93% 94%  94%  Weight:      Height:        Intake/Output Summary (Last 24 hours) at 05/11/2023 1459  Last data filed at 05/11/2023 1000 Gross per 24 hour  Intake 120 ml  Output 1275 ml  Net -1155 ml   Filed Weights   05/10/23 2141  Weight: 80.8 kg    Examination:  Physical Exam Constitutional:      General: He is not in acute distress. Cardiovascular:     Rate and Rhythm: Normal rate and regular rhythm.  Pulmonary:     Effort: Pulmonary effort is normal.     Breath sounds: Normal breath sounds.  Musculoskeletal:     Right lower leg: No edema.     Left lower leg: No edema.  Skin:    General: Skin is warm and dry.  Neurological:     General: No focal deficit present.     Mental Status: He is alert and oriented to person, place, and time.  Psychiatric:        Mood and Affect: Mood normal.         Behavior: Behavior normal.          Scheduled Medications:   aspirin  81 mg Oral Daily   Chlorhexidine Gluconate Cloth  6 each Topical Daily   ezetimibe  10 mg Oral Daily   losartan  25 mg Oral Daily   metoprolol succinate  25 mg Oral Daily   montelukast  10 mg Oral Daily   pregabalin  75 mg Oral BID   rosuvastatin  40 mg Oral Daily   ticagrelor  90 mg Oral BID    Continuous Infusions:   PRN Medications:  acetaminophen, colchicine, ondansetron (ZOFRAN) IV, mouth rinse  Antimicrobials from admission:  Anti-infectives (From admission, onward)    None           Data Reviewed:  I have personally reviewed the following...  CBC: Recent Labs  Lab 05/11/23 0325  WBC 12.7*  HGB 15.3  HCT 43.6  MCV 84.8  PLT 185   Basic Metabolic Panel: Recent Labs  Lab 05/11/23 0325  NA 133*  K 3.9  CL 100  CO2 24  GLUCOSE 240*  BUN 34*  CREATININE 0.95  CALCIUM 9.2   GFR: Estimated Creatinine Clearance: 56.6 mL/min (by C-G formula based on SCr of 0.95 mg/dL). Liver Function Tests: No results for input(s): "AST", "ALT", "ALKPHOS", "BILITOT", "PROT", "ALBUMIN" in the last 168 hours. No results for input(s): "LIPASE", "AMYLASE" in the last 168 hours. No results for input(s): "AMMONIA" in the last 168 hours. Coagulation Profile: No results for input(s): "INR", "PROTIME" in the last 168 hours. Cardiac Enzymes: No results for input(s): "CKTOTAL", "CKMB", "CKMBINDEX", "TROPONINI" in the last 168 hours. BNP (last 3 results) No results for input(s): "PROBNP" in the last 8760 hours. HbA1C: No results for input(s): "HGBA1C" in the last 72 hours. CBG: No results for input(s): "GLUCAP" in the last 168 hours. Lipid Profile: No results for input(s): "CHOL", "HDL", "LDLCALC", "TRIG", "CHOLHDL", "LDLDIRECT" in the last 72 hours. Thyroid Function Tests: No results for input(s): "TSH", "T4TOTAL", "FREET4", "T3FREE", "THYROIDAB" in the last 72 hours. Anemia Panel: No  results for input(s): "VITAMINB12", "FOLATE", "FERRITIN", "TIBC", "IRON", "RETICCTPCT" in the last 72 hours. Most Recent Urinalysis On File:  No results found for: "COLORURINE", "APPEARANCEUR", "LABSPEC", "PHURINE", "GLUCOSEU", "HGBUR", "BILIRUBINUR", "KETONESUR", "PROTEINUR", "UROBILINOGEN", "NITRITE", "LEUKOCYTESUR" Sepsis Labs: @LABRCNTIP (procalcitonin:4,lacticidven:4) Microbiology: Recent Results (from the past 240 hours)  MRSA Next Gen by PCR, Nasal     Status: None   Collection Time: 05/10/23 11:28 PM   Specimen: Nasal Mucosa; Nasal Swab  Result Value Ref Range  Status   MRSA by PCR Next Gen NOT DETECTED NOT DETECTED Final    Comment: (NOTE) The GeneXpert MRSA Assay (FDA approved for NASAL specimens only), is one component of a comprehensive MRSA colonization surveillance program. It is not intended to diagnose MRSA infection nor to guide or monitor treatment for MRSA infections. Test performance is not FDA approved in patients less than 45 years old. Performed at [facility], [address] [place], [place] [address]       Radiology Studies last 3 days: No results found.       Sunnie Nielsen, DO [facility] 05/11/2023, 2:59 PM    Dictation software may have been used to generate the above note. Typos may occur and escape review in typed/dictated notes. Please contact Dr Lyn Hollingshead directly for clarity if needed.  Staff may message me via secure chat in Epic  but this may not receive an immediate response,  please page me for urgent matters!  If 7PM-7AM, please contact night coverage [email or web address]

## 2023-05-11 NOTE — Progress Notes (Signed)
TR band deflated air at a time, site began bleeding when band down to 5 ml, so reinflated to 8 ml air

## 2023-05-11 NOTE — Assessment & Plan Note (Addendum)
-   He will be placed on high-dose statin therapy as mentioned above and Zetia.

## 2023-05-11 NOTE — Plan of Care (Signed)
  Problem: Clinical Measurements: Goal: Will remain free from infection Outcome: Progressing   Problem: Nutrition: Goal: Adequate nutrition will be maintained Outcome: Progressing   Problem: Elimination: Goal: Will not experience complications related to bowel motility Outcome: Progressing Goal: Will not experience complications related to urinary retention Outcome: Progressing   Problem: Education: Goal: Understanding of CV disease, CV risk reduction, and recovery process will improve Outcome: Progressing   Problem: Activity: Goal: Ability to return to baseline activity level will improve Outcome: Progressing

## 2023-05-12 ENCOUNTER — Encounter: Payer: Self-pay | Admitting: Internal Medicine

## 2023-05-12 DIAGNOSIS — I213 ST elevation (STEMI) myocardial infarction of unspecified site: Secondary | ICD-10-CM

## 2023-05-12 LAB — CBC
HCT: 46.3 % (ref 39.0–52.0)
Hemoglobin: 16.2 g/dL (ref 13.0–17.0)
MCH: 29.9 pg (ref 26.0–34.0)
MCHC: 35 g/dL (ref 30.0–36.0)
MCV: 85.6 fL (ref 80.0–100.0)
Platelets: 161 10*3/uL (ref 150–400)
RBC: 5.41 MIL/uL (ref 4.22–5.81)
RDW: 13.3 % (ref 11.5–15.5)
WBC: 11.1 10*3/uL — ABNORMAL HIGH (ref 4.0–10.5)
nRBC: 0 % (ref 0.0–0.2)

## 2023-05-12 LAB — BASIC METABOLIC PANEL
Anion gap: 6 (ref 5–15)
BUN: 26 mg/dL — ABNORMAL HIGH (ref 8–23)
CO2: 27 mmol/L (ref 22–32)
Calcium: 9 mg/dL (ref 8.9–10.3)
Chloride: 100 mmol/L (ref 98–111)
Creatinine, Ser: 0.89 mg/dL (ref 0.61–1.24)
GFR, Estimated: >60 mL/min (ref >60)
Glucose, Bld: 158 mg/dL — ABNORMAL HIGH (ref 70–99)
Potassium: 3.9 mmol/L (ref 3.5–5.1)
Sodium: 133 mmol/L — ABNORMAL LOW (ref 135–145)

## 2023-05-12 MED ORDER — TICAGRELOR 90 MG PO TABS: 90.0000 mg | ORAL_TABLET | Freq: Two times a day (BID) | ORAL | 1 refills | Status: AC

## 2023-05-12 MED ORDER — METOPROLOL SUCCINATE ER 25 MG PO TB24: 25.0000 mg | ORAL_TABLET | Freq: Every day | ORAL | 1 refills | Status: AC

## 2023-05-12 MED ORDER — ROSUVASTATIN CALCIUM 40 MG PO TABS: 40.0000 mg | ORAL_TABLET | Freq: Every day | ORAL | 1 refills | Status: DC

## 2023-05-12 MED ORDER — METOPROLOL SUCCINATE ER 50 MG PO TB24
25.0000 mg | ORAL_TABLET | Freq: Every day | ORAL | Status: DC
Start: 2023-05-12 — End: 2023-05-12
  Administered 2023-05-12: 25 mg via ORAL
  Filled 2023-05-12: qty 1

## 2023-05-12 MED ORDER — ASPIRIN 81 MG PO CHEW: 81.0000 mg | CHEWABLE_TABLET | Freq: Every day | ORAL | 1 refills | Status: AC

## 2023-05-12 NOTE — Progress Notes (Signed)
[facility] CLINIC CARDIOLOGY PROGRESS NOTE   Patient ID: [name] MRN: [id number] DOB/AGE: 11/25/1933 89 y.o.  Admit date: 05/10/2023 Referring Physician None - STEMI pt  Primary Physician [name], DO  Primary Cardiologist None Reason for Consultation STEMI  HPI: [name] is a 89 y.o. male with a past medical history of  type 2 diabetes mellitus, hypertension, dyslipidemia, peripheral neuropathy and prostate cancer who presented to the ED on 05/10/2023 for chest pain. Underwent emergent cardiac catheterization on 05/10/23 with PCI and stent to mid LAD 2.25 x 18 mm frontier Onyx.  Interval History:  -Patient reports feeling well this AM.  -Denies any recurrence of chest pain. Also denies any SOB.  -BP borderline low. HR stable, frequent PVCs noted on tele.   Review of systems complete and found to be negative unless listed above    Vitals:   05/12/23 0000 05/12/23 0400 05/12/23 0701 05/12/23 0931  BP: (!) 89/62 100/60  100/66  Pulse: 81 62  90  Resp: 20 (!) 24    Temp:   98 F (36.7 C)   TempSrc:   Oral   SpO2: 91% 92%    Weight:      Height:         Intake/Output Summary (Last 24 hours) at 05/12/2023 1043 Last data filed at 05/12/2023 0500 Gross per 24 hour  Intake 270 ml  Output 625 ml  Net -355 ml     PHYSICAL EXAM General: Well appearing elderly male, well nourished, in no acute distress. HEENT: Normocephalic and atraumatic. Neck: No JVD.  Lungs: Normal respiratory effort on room air. Clear bilaterally to auscultation. No wheezes, crackles, rhonchi.  Heart: HRRR. Normal S1 and S2 without gallops or murmurs. Radial & DP pulses 2+ bilaterally. Abdomen: Non-distended appearing.  Msk: Normal strength and tone for age. Extremities: No clubbing, cyanosis or edema.   Neuro: Alert and oriented X 3. Psych: Mood appropriate, affect congruent.    LABS: Basic Metabolic Panel: Recent Labs    05/11/23 0325 05/12/23 0454  NA 133* 133*   K 3.9 3.9  CL 100 100  CO2 24 27  GLUCOSE 240* 158*  BUN 34* 26*  CREATININE 0.95 0.89  CALCIUM 9.2 9.0   Liver Function Tests: No results for input(s): "AST", "ALT", "ALKPHOS", "BILITOT", "PROT", "ALBUMIN" in the last 72 hours. No results for input(s): "LIPASE", "AMYLASE" in the last 72 hours. CBC: Recent Labs    05/11/23 0325 05/12/23 0454  WBC 12.7* 11.1*  HGB 15.3 16.2  HCT 43.6 46.3  MCV 84.8 85.6  PLT 185 161   Cardiac Enzymes: Recent Labs    05/11/23 0324  TROPONINIHS >24,000*   BNP: No results for input(s): "BNP" in the last 72 hours. D-Dimer: No results for input(s): "DDIMER" in the last 72 hours. Hemoglobin A1C: No results for input(s): "HGBA1C" in the last 72 hours. Fasting Lipid Panel: No results for input(s): "CHOL", "HDL", "LDLCALC", "TRIG", "CHOLHDL", "LDLDIRECT" in the last 72 hours. Thyroid Function Tests: No results for input(s): "TSH", "T4TOTAL", "T3FREE", "THYROIDAB" in the last 72 hours.  Invalid input(s): "FREET3" Anemia Panel: No results for input(s): "VITAMINB12", "FOLATE", "FERRITIN", "TIBC", "IRON", "RETICCTPCT" in the last 72 hours.  ECHOCARDIOGRAM COMPLETE Result Date: 05/11/2023    ECHOCARDIOGRAM REPORT   Patient Name:   [name] Date of Exam: 05/11/2023 Medical Rec #:  [id number]       Height:       70.0 in Accession #:    [id number]  Weight:       178.1 lb Date of Birth:  Aug 24, 1933      BSA:          1.987 m Patient Age:    89 years        BP:           108/62 mmHg Patient Gender: M               HR:           67 bpm. Exam Location:  [facility] Procedure: 2D Echo Indications:     Acute myocardial infarction. unspecified I21.9  History:         Patient has no prior history of Echocardiogram examinations.  Sonographer:     Overton Mam RDCS, FASE Referring Phys:  [phone] DWAYNE D CALLWOOD Diagnosing Phys: Alwyn Pea MD IMPRESSIONS  1. Ant/apical septal hypo.  2. Left ventricular ejection fraction, by estimation, is 50 to 55%.  The left ventricle has low normal function. The left ventricle demonstrates regional wall motion abnormalities (see scoring diagram/findings for description). There is mild asymmetric left ventricular hypertrophy of the basal-septal segment. Left ventricular diastolic parameters are consistent with Grade I diastolic dysfunction (impaired relaxation).  3. Right ventricular systolic function is normal. The right ventricular size is normal.  4. The mitral valve is normal in structure. No evidence of mitral valve regurgitation.  5. The aortic valve is normal in structure. Aortic valve regurgitation is not visualized. FINDINGS  Left Ventricle: Left ventricular ejection fraction, by estimation, is 50 to 55%. The left ventricle has low normal function. The left ventricle demonstrates regional wall motion abnormalities. The left ventricular internal cavity size was normal in size. There is mild asymmetric left ventricular hypertrophy of the basal-septal segment. Left ventricular diastolic parameters are consistent with Grade I diastolic dysfunction (impaired relaxation). Right Ventricle: The right ventricular size is normal. No increase in right ventricular wall thickness. Right ventricular systolic function is normal. Left Atrium: Left atrial size was normal in size. Right Atrium: Right atrial size was normal in size. Pericardium: There is no evidence of pericardial effusion. Mitral Valve: The mitral valve is normal in structure. No evidence of mitral valve regurgitation. Tricuspid Valve: The tricuspid valve is normal in structure. Tricuspid valve regurgitation is not demonstrated. Aortic Valve: The aortic valve is normal in structure. Aortic valve regurgitation is not visualized. Aortic regurgitation PHT measures 780 msec. Aortic valve peak gradient measures 4.9 mmHg. Pulmonic Valve: The pulmonic valve was normal in structure. Pulmonic valve regurgitation is not visualized. Aorta: The ascending aorta was not well  visualized. IAS/Shunts: No atrial level shunt detected by color flow Doppler. Additional Comments: Ant/apical septal hypo.  LEFT VENTRICLE PLAX 2D LVIDd:         4.10 cm     Diastology LVIDs:         3.00 cm     LV e' medial:    7.18 cm/s LV PW:         1.10 cm     LV E/e' medial:  10.4 LV IVS:        1.40 cm     LV e' lateral:   8.92 cm/s LVOT diam:     1.80 cm     LV E/e' lateral: 8.3 LV SV:         50 LV SV Index:   25 LVOT Area:     2.54 cm  LV Volumes (MOD) LV vol d, MOD A2C: 64.0 ml LV  vol d, MOD A4C: 56.7 ml LV vol s, MOD A2C: 25.9 ml LV vol s, MOD A4C: 28.8 ml LV SV MOD A2C:     38.1 ml LV SV MOD A4C:     56.7 ml LV SV MOD BP:      33.6 ml RIGHT VENTRICLE RV Basal diam:  3.30 cm RV S prime:     13.70 cm/s TAPSE (M-mode): 2.0 cm LEFT ATRIUM             Index        RIGHT ATRIUM           Index LA diam:        3.80 cm 1.91 cm/m   RA Area:     11.90 cm LA Vol (A2C):   20.3 ml 10.22 ml/m  RA Volume:   29.10 ml  14.65 ml/m LA Vol (A4C):   29.7 ml 14.95 ml/m LA Biplane Vol: 26.8 ml 13.49 ml/m  AORTIC VALVE                 PULMONIC VALVE AV Area (Vmax): 2.07 cm     PV Vmax:        0.66 m/s AV Vmax:        111.00 cm/s  PV Peak grad:   1.7 mmHg AV Peak Grad:   4.9 mmHg     RVOT Peak grad: 2 mmHg LVOT Vmax:      90.20 cm/s LVOT Vmean:     61.300 cm/s LVOT VTI:       0.197 m AI PHT:         780 msec  AORTA Ao Root diam: 3.60 cm Ao Asc diam:  3.40 cm MITRAL VALVE               TRICUSPID VALVE MV Area (PHT): 3.37 cm    TV Peak grad:   20.4 mmHg MV Decel Time: 225 msec    TV Vmax:        2.26 m/s MV E velocity: 74.40 cm/s MV A velocity: 88.00 cm/s  SHUNTS MV E/A ratio:  0.85        Systemic VTI:  0.20 m                            Systemic Diam: 1.80 cm Alwyn Pea MD Electronically signed by Alwyn Pea MD Signature Date/Time: 05/11/2023/7:43:20 PM    Final    CARDIAC CATHETERIZATION Result Date: 05/11/2023   LPAV-1 lesion is 100% stenosed.   Ost Cx to Prox Cx lesion is 50% stenosed.   Mid LAD  lesion is 99% stenosed.   1st Diag lesion is 50% stenosed.   1st Mrg lesion is 50% stenosed.   3rd Mrg lesion is 50% stenosed.   Prox LAD to Mid LAD lesion is 50% stenosed.   LPAV-2 lesion is 100% stenosed.   Prox RCA to Mid RCA lesion is 50% stenosed.   Ost LAD to Prox LAD lesion is 50% stenosed.   A drug-eluting stent was successfully placed using a STENT ONYX FRONTIER 2.25X18.   Post intervention, there is a 0% residual stenosis.   Post intervention, there is a 0% residual stenosis.   There is mild left ventricular systolic dysfunction.   LV end diastolic pressure is mildly elevated.   The left ventricular ejection fraction is 45-50% by visual estimate.   Recommend uninterrupted dual antiplatelet therapy with Aspirin 81mg  daily and Ticagrelor  90mg  twice daily for a minimum of 12 months (ACS-Class I recommendation). Conclusion Cardiac cath STEMI presentation from the field Right radial approach 6 French sheath placement right radial artery Left ventriculogram Mild reduced left ventricular function around 45 to 50% with anterior apical hypo- Coronaries Left main medium minor irregularities LAD large diffuse 50% proximal ostial 95% mid Diagonal 1 large 50% ostial to mid Circumflex large 50% proximal mid and distal 100% occluded CTO distal faint collaterals right to left left to left RCA small nondominant moderate disease Intervention PCI and stent mid LAD 2.25 x 18 mm frontier Onyx Postdilated with a 2.25 x 8 mm Beggs neo to 16 atm Lesion reduced from 99 down to 0% TIMI-3 flow maintained throughout the case Patient maintained on aspirin Brilinta hopefully for at least 12 months Transported to ICU for recovery Dissipate discharge within 24 to 48 hours Patient tolerated procedure well No complications    ECHO as above  TELEMETRY reviewed by me 05/12/23: sinus rhythm PVCs rate 70s  EKG reviewed by me 05/12/23: sinus bradycardia PVC rate 53 bpm  DATA reviewed by me 05/12/23: last 24h vitals tele labs imaging I/O,  hospitalist progress note  Principal Problem:   STEMI (ST elevation myocardial infarction) (HCC) Active Problems:   STEMI involving left anterior descending coronary artery (HCC)   Essential hypertension   Dyslipidemia   Type 2 diabetes mellitus with peripheral neuropathy (HCC)    ASSESSMENT AND PLAN: [name] is a 89 y.o. male with a past medical history of  type 2 diabetes mellitus, hypertension, dyslipidemia, peripheral neuropathy and prostate cancer who presented to the ED on 05/10/2023 for chest pain. Underwent emergent cardiac catheterization on 05/10/23 with PCI and stent to mid LAD 2.25 x 18 mm frontier Onyx.  # STEMI # PVCs Patient brought to ED by EMS on 12/14 for severe chest pain with radiation to L arm. EKG concerning for ST elevation in V1, V2. Taken emergently to cath lab and received stent to mid LAD. Echo this admission with EF 50-55%.  -Will DC losartan due to borderline BP and preserved EF.  -Increase metoprolol succinate to 25 mg daily.  -Plan for DAPT with aspirin 81 mg daily and brilinta 90 mg twice daily x12 months.  -Cardiac rehab referral on DC.    # Hyperlipidemia Last lipid panel 01/2023 with LDL 133.  -Continue rosuvastatin 40 mg daily and zetia 10 mg daily. Patient previously only taking zetia prior to admission.  Ok for discharge today from a cardiac perspective. Will arrange for follow up in clinic with Dr. Juliann Pares in 1-2 weeks.    This patient's case was discussed and created with Dr. Corky Sing and he is in agreement.  Signed:  Gale Journey, PA-C  05/12/2023, 10:43 AM [facility]

## 2023-05-12 NOTE — Discharge Summary (Signed)
[name] ZOX:[id number] DOB: [birth year] DOA: 05/10/2023  PCP: [name], DO  Admit date: 05/10/2023 Discharge date: 05/12/2023  Time spent: 35 minutes  Recommendations for Outpatient Follow-up:  Pcp and cardiology f/u  PCP: consider transitioning metformin    Discharge Diagnoses:  Principal Problem:   STEMI (ST elevation myocardial infarction) (HCC) Active Problems:   Dyslipidemia   Essential hypertension   Type 2 diabetes mellitus with peripheral neuropathy (HCC)   STEMI involving left anterior descending coronary artery [facility])   Discharge Condition: stable  Diet recommendation: heart healthy  Filed Weights   05/10/23 2141  Weight: 80.8 kg    History of present illness:  From admission h and p [name] is a 89 y.o. male with medical history significant for type 2 diabetes mellitus, hypertension, dyslipidemia, peripheral neuropathy and prostate cancer, who presented to the emergency room with acute onset of chest pain that started few hours before his presentation to the ER.  He describes it as tightness with radiation to his left arm and graded 9/10 in severity.  He was given 3 sprays of nitroglycerin and and had a Nitropaste placed by EMS as well as 4 baby aspirin.  His pain has improved to 1/10 in severity.  No cough or wheezing or hemoptysis.  No leg pain or edema or recent travels or surgeries.  No bleeding diathesis.  No headache or dizziness or blurred vision.  No fever, oliguria or hematuria or flank pain.  No paresthesias or focal muscle weakness.  The patient came in as a code STEMI.   Hospital Course:  Patient presented with chest pain, brought to cath lab as code STEMI where multi-vessel CAD with cluprit lesion in the LAD was found - stent was placed. Now chest pain free and ambulating without difficulty, cleared by cardiology for discharge. Will f/u with them and with pcp. Cardiac rehab ordered. Starting metoprolol, aspirin, and  brillinta, and atorvastatin. Holding home lisinopril. Given cad, consider switching metformin to an agent known to benefit patients with CAD, such as an sglt2 inhibitor.  Procedures: Left-heart cath   Consultations: cardiology  Discharge Exam: Vitals:   05/12/23 0701 05/12/23 0931  BP:  100/66  Pulse:  90  Resp:    Temp: 98 F (36.7 C)   SpO2:      General: NAD Cardiovascular: RRR Respiratory: CTAB  Discharge Instructions   Discharge Instructions     AMB Referral to Cardiac Rehabilitation - Phase II   Complete by: As directed    Diagnosis:  STEMI Coronary Stents     After initial evaluation and assessments completed: Virtual Based Care may be provided alone or in conjunction with Phase 2 Cardiac Rehab based on patient barriers.: Yes   Diet - low sodium heart healthy   Complete by: As directed    Increase activity slowly   Complete by: As directed       Allergies as of 05/12/2023   No Known Allergies      Medication List     STOP taking these medications    lisinopril 5 MG tablet Commonly known as: ZESTRIL       TAKE these medications    aspirin 81 MG chewable tablet Chew 1 tablet (81 mg total) by mouth daily. Start taking on: May 13, 2023   azelastine 0.1 % nasal spray Commonly known as: ASTELIN Place 2 sprays into both nostrils 2 (two) times daily. Use in each nostril as directed What changed: additional instructions   colchicine  0.6 MG tablet If acute flare only - Day 1: take 2 pills first day, then repeat 1 pill within two hours. Day 2 - take 1 pill daily for up to max 7 days or stop when gout flare resolves.   diclofenac Sodium 1 % Gel Commonly known as: VOLTAREN Apply 2 g topically 4 (four) times daily as needed (elbow bursitis).   ezetimibe 10 MG tablet Commonly known as: ZETIA Take 1 tablet (10 mg total) by mouth daily.   fluticasone 50 MCG/ACT nasal spray Commonly known as: FLONASE Place 2 sprays into both nostrils daily.  Use for 4-6 weeks then stop and use seasonally or as needed.   metFORMIN 500 MG 24 hr tablet Commonly known as: GLUCOPHAGE-XR Take 1 tablet (500 mg total) by mouth daily with breakfast.   metoprolol succinate 25 MG 24 hr tablet Commonly known as: TOPROL-XL Take 1 tablet (25 mg total) by mouth daily. Take with or immediately following a meal. Start taking on: May 13, 2023   montelukast 10 MG tablet Commonly known as: SINGULAIR Take 10 mg by mouth daily.   polyethylene glycol powder 17 GM/SCOOP powder Commonly known as: GLYCOLAX/MIRALAX Take 17-34 g by mouth daily as needed for moderate constipation or mild constipation.   pregabalin 75 MG capsule Commonly known as: LYRICA Take 1 capsule (75 mg total) by mouth 2 (two) times daily.   rosuvastatin 40 MG tablet Commonly known as: CRESTOR Take 1 tablet (40 mg total) by mouth daily. Start taking on: May 13, 2023   ticagrelor 90 MG Tabs tablet Commonly known as: BRILINTA Take 1 tablet (90 mg total) by mouth 2 (two) times daily.       No Known Allergies  Follow-up Information     [name], DO Follow up.   Specialty: Family Medicine Contact information: [address] [place] [address] [phone]         Alwyn Pea, MD Follow up.   Specialties: Cardiology, Internal Medicine Contact information: [address] [place] [address] [phone]                  The results of significant diagnostics from this hospitalization (including imaging, microbiology, ancillary and laboratory) are listed below for reference.    Significant Diagnostic Studies: ECHOCARDIOGRAM COMPLETE Result Date: 05/11/2023    ECHOCARDIOGRAM REPORT   Patient Name:   [name] Date of Exam: 05/11/2023 Medical Rec #:  [id number]       Height:       70.0 in Accession #:    [id number]      Weight:       178.1 lb Date of Birth:  [birth year]      BSA:          1.987 m Patient Age:    89 years         BP:           108/62 mmHg Patient Gender: M               HR:           67 bpm. Exam Location:  [facility] Procedure: 2D Echo Indications:     Acute myocardial infarction. unspecified I21.9  History:         Patient has no prior history of Echocardiogram examinations.  Sonographer:     Overton Mam RDCS, FASE Referring Phys:  [phone] DWAYNE D CALLWOOD Diagnosing Phys: Alwyn Pea MD IMPRESSIONS  1. Ant/apical septal hypo.  2. Left ventricular ejection fraction, by estimation, is 50 to 55%. The left ventricle has low normal function. The left ventricle demonstrates regional wall motion abnormalities (see scoring diagram/findings for description). There is mild asymmetric left ventricular hypertrophy of the basal-septal segment. Left ventricular diastolic parameters are consistent with Grade I diastolic dysfunction (impaired relaxation).  3. Right ventricular systolic function is normal. The right ventricular size is normal.  4. The mitral valve is normal in structure. No evidence of mitral valve regurgitation.  5. The aortic valve is normal in structure. Aortic valve regurgitation is not visualized. FINDINGS  Left Ventricle: Left ventricular ejection fraction, by estimation, is 50 to 55%. The left ventricle has low normal function. The left ventricle demonstrates regional wall motion abnormalities. The left ventricular internal cavity size was normal in size. There is mild asymmetric left ventricular hypertrophy of the basal-septal segment. Left ventricular diastolic parameters are consistent with Grade I diastolic dysfunction (impaired relaxation). Right Ventricle: The right ventricular size is normal. No increase in right ventricular wall thickness. Right ventricular systolic function is normal. Left Atrium: Left atrial size was normal in size. Right Atrium: Right atrial size was normal in size. Pericardium: There is no evidence of pericardial effusion. Mitral Valve: The mitral valve is normal in structure.  No evidence of mitral valve regurgitation. Tricuspid Valve: The tricuspid valve is normal in structure. Tricuspid valve regurgitation is not demonstrated. Aortic Valve: The aortic valve is normal in structure. Aortic valve regurgitation is not visualized. Aortic regurgitation PHT measures 780 msec. Aortic valve peak gradient measures 4.9 mmHg. Pulmonic Valve: The pulmonic valve was normal in structure. Pulmonic valve regurgitation is not visualized. Aorta: The ascending aorta was not well visualized. IAS/Shunts: No atrial level shunt detected by color flow Doppler. Additional Comments: Ant/apical septal hypo.  LEFT VENTRICLE PLAX 2D LVIDd:         4.10 cm     Diastology LVIDs:         3.00 cm     LV e' medial:    7.18 cm/s LV PW:         1.10 cm     LV E/e' medial:  10.4 LV IVS:        1.40 cm     LV e' lateral:   8.92 cm/s LVOT diam:     1.80 cm     LV E/e' lateral: 8.3 LV SV:         50 LV SV Index:   25 LVOT Area:     2.54 cm  LV Volumes (MOD) LV vol d, MOD A2C: 64.0 ml LV vol d, MOD A4C: 56.7 ml LV vol s, MOD A2C: 25.9 ml LV vol s, MOD A4C: 28.8 ml LV SV MOD A2C:     38.1 ml LV SV MOD A4C:     56.7 ml LV SV MOD BP:      33.6 ml RIGHT VENTRICLE RV Basal diam:  3.30 cm RV S prime:     13.70 cm/s TAPSE (M-mode): 2.0 cm LEFT ATRIUM             Index        RIGHT ATRIUM           Index LA diam:        3.80 cm 1.91 cm/m   RA Area:     11.90 cm LA Vol (A2C):   20.3 ml 10.22 ml/m  RA Volume:   29.10 ml  14.65 ml/m LA Vol (A4C):  29.7 ml 14.95 ml/m LA Biplane Vol: 26.8 ml 13.49 ml/m  AORTIC VALVE                 PULMONIC VALVE AV Area (Vmax): 2.07 cm     PV Vmax:        0.66 m/s AV Vmax:        111.00 cm/s  PV Peak grad:   1.7 mmHg AV Peak Grad:   4.9 mmHg     RVOT Peak grad: 2 mmHg LVOT Vmax:      90.20 cm/s LVOT Vmean:     61.300 cm/s LVOT VTI:       0.197 m AI PHT:         780 msec  AORTA Ao Root diam: 3.60 cm Ao Asc diam:  3.40 cm MITRAL VALVE               TRICUSPID VALVE MV Area (PHT): 3.37 cm    TV Peak  grad:   20.4 mmHg MV Decel Time: 225 msec    TV Vmax:        2.26 m/s MV E velocity: 74.40 cm/s MV A velocity: 88.00 cm/s  SHUNTS MV E/A ratio:  0.85        Systemic VTI:  0.20 m                            Systemic Diam: 1.80 cm Alwyn Pea MD Electronically signed by Alwyn Pea MD Signature Date/Time: 05/11/2023/7:43:20 PM    Final    CARDIAC CATHETERIZATION Result Date: 05/11/2023   LPAV-1 lesion is 100% stenosed.   Ost Cx to Prox Cx lesion is 50% stenosed.   Mid LAD lesion is 99% stenosed.   1st Diag lesion is 50% stenosed.   1st Mrg lesion is 50% stenosed.   3rd Mrg lesion is 50% stenosed.   Prox LAD to Mid LAD lesion is 50% stenosed.   LPAV-2 lesion is 100% stenosed.   Prox RCA to Mid RCA lesion is 50% stenosed.   Ost LAD to Prox LAD lesion is 50% stenosed.   A drug-eluting stent was successfully placed using a STENT ONYX FRONTIER 2.25X18.   Post intervention, there is a 0% residual stenosis.   Post intervention, there is a 0% residual stenosis.   There is mild left ventricular systolic dysfunction.   LV end diastolic pressure is mildly elevated.   The left ventricular ejection fraction is 45-50% by visual estimate.   Recommend uninterrupted dual antiplatelet therapy with Aspirin 81mg  daily and Ticagrelor 90mg  twice daily for a minimum of 12 months (ACS-Class I recommendation). Conclusion Cardiac cath STEMI presentation from the field Right radial approach 6 French sheath placement right radial artery Left ventriculogram Mild reduced left ventricular function around 45 to 50% with anterior apical hypo- Coronaries Left main medium minor irregularities LAD large diffuse 50% proximal ostial 95% mid Diagonal 1 large 50% ostial to mid Circumflex large 50% proximal mid and distal 100% occluded CTO distal faint collaterals right to left left to left RCA small nondominant moderate disease Intervention PCI and stent mid LAD 2.25 x 18 mm frontier Onyx Postdilated with a 2.25 x 8 mm [place] neo to 16 atm  Lesion reduced from 99 down to 0% TIMI-3 flow maintained throughout the case Patient maintained on aspirin Brilinta hopefully for at least 12 months Transported to ICU for recovery Dissipate discharge within 24 to 48 hours Patient tolerated procedure  well No complications    Microbiology: Recent Results (from the past 240 hours)  MRSA Next Gen by PCR, Nasal     Status: None   Collection Time: 05/10/23 11:28 PM   Specimen: Nasal Mucosa; Nasal Swab  Result Value Ref Range Status   MRSA by PCR Next Gen NOT DETECTED NOT DETECTED Final    Comment: (NOTE) The GeneXpert MRSA Assay (FDA approved for NASAL specimens only), is one component of a comprehensive MRSA colonization surveillance program. It is not intended to diagnose MRSA infection nor to guide or monitor treatment for MRSA infections. Test performance is not FDA approved in patients less than 24 years old. Performed at [facility], [address] [place], [place] [address]      Labs: Basic Metabolic Panel: Recent Labs  Lab 05/11/23 0325 05/12/23 0454  NA 133* 133*  K 3.9 3.9  CL 100 100  CO2 24 27  GLUCOSE 240* 158*  BUN 34* 26*  CREATININE 0.95 0.89  CALCIUM 9.2 9.0   Liver Function Tests: No results for input(s): "AST", "ALT", "ALKPHOS", "BILITOT", "PROT", "ALBUMIN" in the last 168 hours. No results for input(s): "LIPASE", "AMYLASE" in the last 168 hours. No results for input(s): "AMMONIA" in the last 168 hours. CBC: Recent Labs  Lab 05/11/23 0325 05/12/23 0454  WBC 12.7* 11.1*  HGB 15.3 16.2  HCT 43.6 46.3  MCV 84.8 85.6  PLT 185 161   Cardiac Enzymes: No results for input(s): "CKTOTAL", "CKMB", "CKMBINDEX", "TROPONINI" in the last 168 hours. BNP: BNP (last 3 results) No results for input(s): "BNP" in the last 8760 hours.  ProBNP (last 3 results) No results for input(s): "PROBNP" in the last 8760 hours.  CBG: No results for input(s): "GLUCAP" in the last 168  hours.     Signed:  Silvano Bilis MD.  [facility] 05/12/2023, 12:52 PM

## 2023-05-13 ENCOUNTER — Telehealth: Payer: Self-pay

## 2023-05-13 ENCOUNTER — Other Ambulatory Visit: Payer: Self-pay | Admitting: Family Medicine

## 2023-05-13 DIAGNOSIS — I25118 Atherosclerotic heart disease of native coronary artery with other forms of angina pectoris: Secondary | ICD-10-CM

## 2023-05-13 DIAGNOSIS — I252 Old myocardial infarction: Secondary | ICD-10-CM

## 2023-05-13 LAB — LIPOPROTEIN A (LPA): Lipoprotein (a): <8.4 nmol/L (ref <75.0)

## 2023-05-13 NOTE — Transitions of Care (Post Inpatient/ED Visit) (Signed)
05/13/2023  Name: [name] MRN: [id number] DOB: 11-19-1933  Today's TOC FU Call Status: Today's TOC FU Call Status:: Successful TOC FU Call Completed TOC FU Call Complete Date: 05/13/23 Patient's Name and Date of Birth confirmed.  Transition Care Management Follow-up Telephone Call Date of Discharge: 05/13/23 Discharge Facility: [facility]) Type of Discharge: Inpatient Admission Primary Inpatient Discharge Diagnosis:: STEMI How have you been since you were released from the hospital?: Better Any questions or concerns?: No  Items Reviewed: Did you receive and understand the discharge instructions provided?: Yes Medications obtained,verified, and reconciled?: Yes (Medications Reviewed) Any new allergies since your discharge?: No Dietary orders reviewed?: Yes Type of Diet Ordered:: Low sodium Carb Healthy Do you have support at home?: Yes People in Home: spouse  Medications Reviewed Today: Medications Reviewed Today     Reviewed by Redge Gainer, RN (Case Manager) on 05/13/23 at [phone]  Med List Status: <None>   Medication Order Taking? Sig Documenting Provider Last Dose Status Informant  aspirin 81 MG chewable tablet [id number]  Chew 1 tablet (81 mg total) by mouth daily. Wouk, Wilfred Curtis, MD  Active   azelastine (ASTELIN) 0.1 % nasal spray [id number] No Place 2 sprays into both nostrils 2 (two) times daily. Use in each nostril as directed  Patient taking differently: Place 2 sprays into both nostrils 2 (two) times daily. Use in each nostril as directed PRN   [name], DO Taking Active   colchicine 0.6 MG tablet [id number] No If acute flare only - Day 1: take 2 pills first day, then repeat 1 pill within two hours. Day 2 - take 1 pill daily for up to max 7 days or stop when gout flare resolves. [name], DO Past Month Active   diclofenac Sodium (VOLTAREN) 1 % GEL [id number] No Apply 2 g topically 4 (four) times  daily as needed (elbow bursitis). [name], DO 05/09/2023 Active   ezetimibe (ZETIA) 10 MG tablet [id number] No Take 1 tablet (10 mg total) by mouth daily. [name], DO 05/10/2023 Active   fluticasone (FLONASE) 50 MCG/ACT nasal spray [id number] No Place 2 sprays into both nostrils daily. Use for 4-6 weeks then stop and use seasonally or as needed. [name], DO 04/10/2023 Active   metFORMIN (GLUCOPHAGE-XR) 500 MG 24 hr tablet [id number] No Take 1 tablet (500 mg total) by mouth daily with breakfast. [name], DO 05/10/2023 Active   metoprolol succinate (TOPROL-XL) 25 MG 24 hr tablet [id number]  Take 1 tablet (25 mg total) by mouth daily. Take with or immediately following a meal. Wouk, Wilfred Curtis, MD  Active   montelukast (SINGULAIR) 10 MG tablet [id number] No Take 10 mg by mouth daily. [provider] 05/10/2023 Active   polyethylene glycol powder (GLYCOLAX/MIRALAX) 17 GM/SCOOP powder [id number] No Take 17-34 g by mouth daily as needed for moderate constipation or mild constipation. [name], DO 05/09/2023 Active   pregabalin (LYRICA) 75 MG capsule [id number] No Take 1 capsule (75 mg total) by mouth 2 (two) times daily. [name], DO 05/10/2023 Active   rosuvastatin (CRESTOR) 40 MG tablet [id number]  Take 1 tablet (40 mg total) by mouth daily. Wouk, Wilfred Curtis, MD  Active   ticagrelor (BRILINTA) 90 MG TABS tablet [id number]  Take 1 tablet (90 mg total) by mouth 2 (two) times daily. Wouk, Wilfred Curtis, MD  Active             Home Care and  Equipment/Supplies: Were Home Health Services Ordered?: No Any new equipment or medical supplies ordered?: No  Functional Questionnaire: Do you need assistance with bathing/showering or dressing?: No Do you need assistance with meal preparation?: No Do you need assistance with eating?: No Do you have difficulty maintaining continence: No Do you need assistance  with getting out of bed/getting out of a chair/moving?: No Do you have difficulty managing or taking your medications?: No  Follow up appointments reviewed: PCP Follow-up appointment confirmed?: Yes Date of PCP follow-up appointment?: 05/19/23 Follow-up Provider: Saralyn Pilar Specialist [facility] Follow-up appointment confirmed?: NA Do you need transportation to your follow-up appointment?: No Do you understand care options if your condition(s) worsen?: Yes-patient verbalized understanding  SDOH Interventions Today    Flowsheet Row Most Recent Value  SDOH Interventions   Food Insecurity Interventions Intervention Not Indicated  Housing Interventions Intervention Not Indicated  Transportation Interventions Intervention Not Indicated  Utilities Interventions Intervention Not Indicated      Talked with the patient on the phone. He states he is feeling better after his MI and has a stent placed. He states he could benefit from a nurse coming out to the house and helping him with his medications and getting them organized. He also would like some education. Message sent to the provider for and order for [facility]. Recommendations for patient to go to cardiac rehab.   The patient has been provided with contact information for the care management team and has been advised to call with any health-related questions or concerns. The patient verbalized understanding with current POC. The patient is directed to their insurance card regarding availability of benefits coverage.  Deidre Ala, RN Medical illustrator [facility] [phone]

## 2023-05-15 DIAGNOSIS — E1122 Type 2 diabetes mellitus with diabetic chronic kidney disease: Secondary | ICD-10-CM | POA: Diagnosis not present

## 2023-05-15 DIAGNOSIS — I129 Hypertensive chronic kidney disease with stage 1 through stage 4 chronic kidney disease, or unspecified chronic kidney disease: Secondary | ICD-10-CM | POA: Diagnosis not present

## 2023-05-15 DIAGNOSIS — K5900 Constipation, unspecified: Secondary | ICD-10-CM | POA: Diagnosis not present

## 2023-05-15 DIAGNOSIS — E785 Hyperlipidemia, unspecified: Secondary | ICD-10-CM | POA: Diagnosis not present

## 2023-05-15 DIAGNOSIS — I2102 ST elevation (STEMI) myocardial infarction involving left anterior descending coronary artery: Secondary | ICD-10-CM | POA: Diagnosis not present

## 2023-05-15 DIAGNOSIS — N183 Chronic kidney disease, stage 3 unspecified: Secondary | ICD-10-CM | POA: Diagnosis not present

## 2023-05-15 DIAGNOSIS — I25119 Atherosclerotic heart disease of native coronary artery with unspecified angina pectoris: Secondary | ICD-10-CM | POA: Diagnosis not present

## 2023-05-15 DIAGNOSIS — E1142 Type 2 diabetes mellitus with diabetic polyneuropathy: Secondary | ICD-10-CM | POA: Diagnosis not present

## 2023-05-15 DIAGNOSIS — E1169 Type 2 diabetes mellitus with other specified complication: Secondary | ICD-10-CM | POA: Diagnosis not present

## 2023-05-19 ENCOUNTER — Encounter: Payer: Self-pay | Admitting: Family Medicine

## 2023-05-19 ENCOUNTER — Telehealth: Payer: Self-pay

## 2023-05-19 ENCOUNTER — Ambulatory Visit: Payer: Medicare HMO | Admitting: Family Medicine

## 2023-05-19 VITALS — BP 108/56 | HR 78 | Ht 70.0 in | Wt 164.0 lb

## 2023-05-19 DIAGNOSIS — E782 Mixed hyperlipidemia: Secondary | ICD-10-CM | POA: Diagnosis not present

## 2023-05-19 DIAGNOSIS — I2510 Atherosclerotic heart disease of native coronary artery without angina pectoris: Secondary | ICD-10-CM | POA: Diagnosis not present

## 2023-05-19 DIAGNOSIS — I252 Old myocardial infarction: Secondary | ICD-10-CM

## 2023-05-19 DIAGNOSIS — I25118 Atherosclerotic heart disease of native coronary artery with other forms of angina pectoris: Secondary | ICD-10-CM

## 2023-05-19 DIAGNOSIS — E1142 Type 2 diabetes mellitus with diabetic polyneuropathy: Secondary | ICD-10-CM | POA: Diagnosis not present

## 2023-05-19 DIAGNOSIS — I10 Essential (primary) hypertension: Secondary | ICD-10-CM | POA: Diagnosis not present

## 2023-05-19 NOTE — Telephone Encounter (Signed)
Left message with [name] (confidential voice mail).  Ok to proceed with skilled nursing.

## 2023-05-19 NOTE — Patient Instructions (Addendum)
Thank you for coming to the office today.  Remain OFF Lisinopril.  Continue current cardiology medications as prescribed.  Reconsider new diabetes medications  Such as Jardiance or Ozempic.  If you have any significant chest pain that does not go away within 30 minutes, is accompanied by nausea, sweating, shortness of breath, or made worse by activity, this may be evidence of a heart attack, especially if symptoms worsening instead of improving, please call 911 or go directly to the emergency room immediately for evaluation.   Please schedule a Follow-up Appointment to: Return if symptoms worsen or fail to improve.  If you have any other questions or concerns, please feel free to call the office or send a message through MyChart. You may also schedule an earlier appointment if necessary.  Additionally, you may be receiving a survey about your experience at our office within a few days to 1 week by e-mail or mail. We value your feedback.  Saralyn Pilar, DO [facility], [place]

## 2023-05-19 NOTE — Progress Notes (Signed)
Subjective:    Patient ID: [name], male    DOB: May 21, 1934, 89 y.o.   MRN: [id number]  [name] is a 89 y.o. male presenting on 05/19/2023 for Hospitalization Follow-up (2 weeks ago heart attack)   HPI  Discussed the use of AI scribe software for clinical note transcription with the patient, who gave verbal consent to proceed.  History of Present Illness     HOSPITAL FOLLOW-UP VISIT  Hospital/Location: [facility] Date of Admission: 05/10/23 Date of Discharge: 05/12/23 Transitions of care telephone call: TOC completed by Redge Gainer RN, on 05/13/23  Reason for Admission: Chest Pain, STEMI  - Hospital H&P and Discharge Summary have been reviewed - Patient presents today 7 days after recent hospitalization. Brief summary of recent course, patient had symptoms of chest pain dyspnea sudden acute onset on 05/10/23, experiencing chest pain, arm discomfort, and facial discomfort while watching TV, which progressively worsened, note he was in office here 05/06/23 for some mixed symptoms mild dyspnea rib symptoms and constipation but without chest pain at that time. Then he had significant sudden worsening over the weekend, hospitalized, treated with cardiology management for STEMI, found to have multi vessel CAD in cath lab, and LAD lesion with stent placement.  He was discharged w/ medication management and outpatient f/u with Cardiology. Started on Metoprolol, Aspirin, Brillinta, Atorvastatin. He was held on Lisinopril.  They recommended changing Metformin to other agent in future . - Today reports overall has done well after discharge. Symptoms of dyspnea and chest pain have improved and resolved  One week post-procedure, the patient reports feeling "pretty good" and has been advised to resume normal activities by Cardiology. He will start his own gym exercise regimen to help. He was advised to proceed w/ any activity and exercise. Did not need cardiac rehab per [facility]  Cardiology  The patient is considering using a sodium substitute due to a preference for salty foods. He is also contemplating healthier alternatives for his preferred breakfast foods  He has a small catch when turns and rotates in lower R back.  - New medications on discharge: Brilinta, Aspirin, Atorvastatin Metoprolol - Changes to current meds on discharge: Held Lisinoprli  I have reviewed the discharge medication list, and have reconciled the current and discharge medications today.   Current Outpatient Medications:    aspirin 81 MG chewable tablet, Chew 1 tablet (81 mg total) by mouth daily., Disp: 90 tablet, Rfl: 1   azelastine (ASTELIN) 0.1 % nasal spray, Place 2 sprays into both nostrils 2 (two) times daily. Use in each nostril as directed (Patient taking differently: Place 2 sprays into both nostrils 2 (two) times daily. Use in each nostril as directed PRN), Disp: 30 mL, Rfl: 12   colchicine 0.6 MG tablet, If acute flare only - Day 1: take 2 pills first day, then repeat 1 pill within two hours. Day 2 - take 1 pill daily for up to max 7 days or stop when gout flare resolves., Disp: 20 tablet, Rfl: 2   diclofenac Sodium (VOLTAREN) 1 % GEL, Apply 2 g topically 4 (four) times daily as needed (elbow bursitis)., Disp: 100 g, Rfl: 2   ezetimibe (ZETIA) 10 MG tablet, Take 1 tablet (10 mg total) by mouth daily., Disp: 90 tablet, Rfl: 3   fluticasone (FLONASE) 50 MCG/ACT nasal spray, Place 2 sprays into both nostrils daily. Use for 4-6 weeks then stop and use seasonally or as needed., Disp: 16 g, Rfl: 3   metFORMIN (GLUCOPHAGE-XR)  500 MG 24 hr tablet, Take 1 tablet (500 mg total) by mouth daily with breakfast., Disp: 90 tablet, Rfl: 3   metoprolol succinate (TOPROL-XL) 25 MG 24 hr tablet, Take 1 tablet (25 mg total) by mouth daily. Take with or immediately following a meal., Disp: 60 tablet, Rfl: 1   montelukast (SINGULAIR) 10 MG tablet, Take 10 mg by mouth daily., Disp: , Rfl:    polyethylene  glycol powder (GLYCOLAX/MIRALAX) 17 GM/SCOOP powder, Take 17-34 g by mouth daily as needed for moderate constipation or mild constipation., Disp: 238 g, Rfl: 1   pregabalin (LYRICA) 75 MG capsule, Take 1 capsule (75 mg total) by mouth 2 (two) times daily., Disp: 180 capsule, Rfl: 1   rosuvastatin (CRESTOR) 40 MG tablet, Take 1 tablet (40 mg total) by mouth daily., Disp: 90 tablet, Rfl: 1   ticagrelor (BRILINTA) 90 MG TABS tablet, Take 1 tablet (90 mg total) by mouth 2 (two) times daily., Disp: 180 tablet, Rfl: 1  ------------------------------------------------------------------------- Social History   Tobacco Use   Smoking status: Former    Current packs/day: 0.00    Types: Cigarettes    Quit date: 05/27/1978    Years since quitting: 45.0   Smokeless tobacco: Never  Vaping Use   Vaping status: Never Used  Substance Use Topics   Alcohol use: No    Alcohol/week: 0.0 standard drinks of alcohol   Drug use: No    Review of Systems Per HPI unless specifically indicated above     Objective:    BP (!) 108/56   Pulse 78   Ht 5\' 10"  (1.778 m)   Wt 164 lb (74.4 kg)   SpO2 95%   BMI 23.53 kg/m   Wt Readings from Last 3 Encounters:  05/19/23 164 lb (74.4 kg)  05/10/23 178 lb 1.6 oz (80.8 kg)  05/06/23 169 lb (76.7 kg)    Physical Exam Vitals and nursing note reviewed.  Constitutional:      General: He is not in acute distress.    Appearance: He is well-developed. He is not diaphoretic.     Comments: Well-appearing, comfortable, cooperative  HENT:     Head: Normocephalic and atraumatic.  Eyes:     General:        Right eye: No discharge.        Left eye: No discharge.     Conjunctiva/sclera: Conjunctivae normal.  Neck:     Thyroid: No thyromegaly.  Cardiovascular:     Rate and Rhythm: Normal rate and regular rhythm.     Pulses: Normal pulses.     Heart sounds: Normal heart sounds. No murmur heard. Pulmonary:     Effort: Pulmonary effort is normal. No respiratory  distress.     Breath sounds: Normal breath sounds. No wheezing or rales.  Musculoskeletal:        General: Normal range of motion.     Cervical back: Normal range of motion and neck supple.  Lymphadenopathy:     Cervical: No cervical adenopathy.  Skin:    General: Skin is warm and dry.     Findings: No erythema or rash.  Neurological:     Mental Status: He is alert and oriented to person, place, and time. Mental status is at baseline.  Psychiatric:        Behavior: Behavior normal.     Comments: Well groomed, good eye contact, normal speech and thoughts       Results for orders placed or performed during the hospital encounter  of 05/10/23  CARDIAC CATHETERIZATION   Collection Time: 05/10/23 10:04 PM  Result Value Ref Range   Cath EF Quantitative 45 %  CG4 I-STAT (Lactic acid)   Collection Time: 05/10/23 10:31 PM  Result Value Ref Range   Lactic Acid, Venous 3.0 (HH) 0.5 - 1.9 mmol/L  POCT Activated clotting time   Collection Time: 05/10/23 10:36 PM  Result Value Ref Range   Activated Clotting Time 308 seconds  MRSA Next Gen by PCR, Nasal   Collection Time: 05/10/23 11:28 PM   Specimen: Nasal Mucosa; Nasal Swab  Result Value Ref Range   MRSA by PCR Next Gen NOT DETECTED NOT DETECTED  Troponin I (High Sensitivity)   Collection Time: 05/11/23  3:24 AM  Result Value Ref Range   Troponin I (High Sensitivity) >24,000 (HH) <18 ng/L  Basic metabolic panel   Collection Time: 05/11/23  3:25 AM  Result Value Ref Range   Sodium 133 (L) 135 - 145 mmol/L   Potassium 3.9 3.5 - 5.1 mmol/L   Chloride 100 98 - 111 mmol/L   CO2 24 22 - 32 mmol/L   Glucose, Bld 240 (H) 70 - 99 mg/dL   BUN 34 (H) 8 - 23 mg/dL   Creatinine, Ser 0.98 0.61 - 1.24 mg/dL   Calcium 9.2 8.9 - 11.9 mg/dL   GFR, Estimated >14 >78 mL/min   Anion gap 9 5 - 15  CBC   Collection Time: 05/11/23  3:25 AM  Result Value Ref Range   WBC 12.7 (H) 4.0 - 10.5 K/uL   RBC 5.14 4.22 - 5.81 MIL/uL   Hemoglobin 15.3  13.0 - 17.0 g/dL   HCT 29.5 62.1 - 30.8 %   MCV 84.8 80.0 - 100.0 fL   MCH 29.8 26.0 - 34.0 pg   MCHC 35.1 30.0 - 36.0 g/dL   RDW 65.7 84.6 - 96.2 %   Platelets 185 150 - 400 K/uL   nRBC 0.0 0.0 - 0.2 %  Lipoprotein A (LPA)   Collection Time: 05/11/23  3:25 AM  Result Value Ref Range   Lipoprotein (a) <8.4 <75.0 nmol/L  ECHOCARDIOGRAM COMPLETE   Collection Time: 05/11/23  8:33 AM  Result Value Ref Range   Weight 2,849.6 oz   Height 70 in   BP 108/62 mmHg   Ao pk vel 1.11 m/s   AR max vel 2.07 cm2   AV Peak grad 4.9 mmHg   Single Plane A2C EF 59.5 %   Single Plane A4C EF 49.2 %   Calc EF 55.0 %   S' Lateral 3.00 cm   Area-P 1/2 3.37 cm2   P 1/2 time 780 msec   Est EF 50 - 55%   CBC   Collection Time: 05/12/23  4:54 AM  Result Value Ref Range   WBC 11.1 (H) 4.0 - 10.5 K/uL   RBC 5.41 4.22 - 5.81 MIL/uL   Hemoglobin 16.2 13.0 - 17.0 g/dL   HCT 95.2 84.1 - 32.4 %   MCV 85.6 80.0 - 100.0 fL   MCH 29.9 26.0 - 34.0 pg   MCHC 35.0 30.0 - 36.0 g/dL   RDW 40.1 02.7 - 25.3 %   Platelets 161 150 - 400 K/uL   nRBC 0.0 0.0 - 0.2 %  Basic metabolic panel   Collection Time: 05/12/23  4:54 AM  Result Value Ref Range   Sodium 133 (L) 135 - 145 mmol/L   Potassium 3.9 3.5 - 5.1 mmol/L   Chloride 100 98 -  111 mmol/L   CO2 27 22 - 32 mmol/L   Glucose, Bld 158 (H) 70 - 99 mg/dL   BUN 26 (H) 8 - 23 mg/dL   Creatinine, Ser 2.13 0.61 - 1.24 mg/dL   Calcium 9.0 8.9 - 08.6 mg/dL   GFR, Estimated >57 >84 mL/min   Anion gap 6 5 - 15      Assessment & Plan:   Problem List Items Addressed This Visit     Type 2 diabetes mellitus with diabetic polyneuropathy (HCC)   Other Visit Diagnoses       History of MI (myocardial infarction)    -  Primary     Coronary artery disease of native artery of native heart with stable angina pectoris (HCC)            History of Recent Myocardial Infarction STEMI HFU, Notes reviewed [facility]. S/p stent - Cardiac catheterization  revealed 95% blockage in LAD s -Continue current cardiac medications as prescribed by the cardiologist. - DAPT Brilinta ASA, Statin, BB -Consider cardiac rehabilitation or maintain physical activity at the gym.  Hypertension Blood pressure well-controlled, currently off Lisinopril due to low blood pressure readings. -Continue current management without Lisinopril at this time  Type 2 Diabetes Mellitus Currently on Metformin. Discussed potential switch to a medication with cardiac benefits such as Jardiance, Ozempic, or Rybelsus. He prefers to avoid other meds due to name brand cost at this time. I gave him info other medication options to reconsider -Continue Metformin for now. -Revisit the discussion of switching diabetes medication at the next appointment in April.  Dietary Management Discussed the importance of a heart-healthy, low sodium diet. Patient is making efforts to adjust diet, including reducing sodium and fat intake. -Provide additional information on heart-healthy diet. -Encourage continued dietary modifications.  Follow-up Scheduled for April. Cardiologist follow-up also scheduled for April.        No orders of the defined types were placed in this encounter.   Follow up plan: Return if symptoms worsen or fail to improve.   Saralyn Pilar, DO [facility] 05/19/2023, 2:19 PM

## 2023-05-19 NOTE — Telephone Encounter (Signed)
Okay to proceed w/ verbal orders. Thank you  Saralyn Pilar, DO [facility] 05/19/2023, 11:43 AM

## 2023-05-19 NOTE — Telephone Encounter (Signed)
Copied from CRM [id number]. Topic: Quick Communication - Home Health Verbal Orders >> May 16, 2023  4:27 PM Shon Hale wrote: Caller/Agency: Elnita Maxwell, RN - [facility]  Callback Number:  [phone] Service Requested: Skilled Nursing Frequency: 1 w x 9 Any new concerns about the patient? Yes Pt was discharged from Hospital w/ coronary artery disease and new medications. Elnita Maxwell informed by patient about Hospital f/u

## 2023-05-20 LAB — GLUCOSE, CAPILLARY: Glucose-Capillary: 293 mg/dL — ABNORMAL HIGH (ref 70–99)

## 2023-05-30 ENCOUNTER — Telehealth: Payer: Self-pay

## 2023-05-30 NOTE — Telephone Encounter (Signed)
 Copied from CRM [id number]. Topic: General - Other >> May 30, 2023  3:04 PM Ja-Kwan M wrote: Reason for CRM: Marchelle Folks with [facility] reports that they did not admit pt for home health services. Cb# [phone]

## 2023-06-09 ENCOUNTER — Other Ambulatory Visit: Payer: Self-pay | Admitting: Family Medicine

## 2023-06-09 DIAGNOSIS — G63 Polyneuropathy in diseases classified elsewhere: Secondary | ICD-10-CM

## 2023-06-09 DIAGNOSIS — E1142 Type 2 diabetes mellitus with diabetic polyneuropathy: Secondary | ICD-10-CM

## 2023-06-10 DIAGNOSIS — E1142 Type 2 diabetes mellitus with diabetic polyneuropathy: Secondary | ICD-10-CM | POA: Diagnosis not present

## 2023-06-10 NOTE — Telephone Encounter (Signed)
 Copied from CRM [id number]. Topic: General - Other >> Jun 09, 2023  4:29 PM Everette C wrote: Reason for CRM: The patient has been directed by their pharmacy to contact their PCP and follow up on the submission of a previous refill request of pregabalin  (LYRICA ) 75 MG capsule [[id number]]  Please contact the patient further when possible

## 2023-06-15 NOTE — Consult Note (Signed)
CARDIOLOGY CONSULT NOTE               Patient ID: [name] MRN: [id number] DOB/AGE: 88-Dec-1935 88 y.o.  Admit date: 05/10/2023 Referring Physician Dr. Ennis Forts hospitalist Primary Physician Dr. Althea Charon primary Primary Cardiologist  Reason for Consultation STEMI  HPI: Patient presented with acute chest pain symptoms multiple risk factors diabetes hypertension hyperlipidemia peripheral neuropathy came to emergency room with chest pain which started a few hours previously tightness radiation to left arm dilated 10 treated with nitroglycerin paste with some improvement in symptoms given aspirin as well patient had ST elevation septal anteriorly reciprocal changes inferiorly patient was given heparin and taken to the cardiac Cath Lab for cardiac cath possible PCI and stent  Review of systems complete and found to be negative unless listed above     Past Medical History:  Diagnosis Date   Diabetes mellitus without complication (HCC)    Hyperlipidemia    Hypertension    Neuropathy    Prostate cancer [facility])     Past Surgical History:  Procedure Laterality Date   CATARACT EXTRACTION W/PHACO Left 04/24/2015   Procedure: CATARACT EXTRACTION PHACO AND INTRAOCULAR LENS PLACEMENT (IOC);  Surgeon: Sallee Lange, MD;  Location: [facility] ORS;  Service: Ophthalmology;  Laterality: Left;  [place] 01:12 AP% 25.7 CDE 28.30 fluid pack lot #[id number]   COLONOSCOPY     CORONARY/GRAFT ACUTE MI REVASCULARIZATION N/A 05/10/2023   Procedure: Coronary/Graft Acute MI Revascularization;  Surgeon: Alwyn Pea, MD;  Location: [facility] INVASIVE CV LAB;  Service: Cardiovascular;  Laterality: N/A;   LEFT HEART CATH AND CORONARY ANGIOGRAPHY N/A 05/10/2023   Procedure: LEFT HEART CATH AND CORONARY ANGIOGRAPHY;  Surgeon: Alwyn Pea, MD;  Location: [facility] INVASIVE CV LAB;  Service: Cardiovascular;  Laterality: N/A;    No medications prior to admission.   Social History   Socioeconomic  History   Marital status: Widowed    Spouse name: Not on file   Number of children: Not on file   Years of education: Not on file   Highest education level: High school graduate  Occupational History   Not on file  Tobacco Use   Smoking status: Former    Current packs/day: 0.00    Types: Cigarettes    Quit date: 05/27/1978    Years since quitting: 45.0   Smokeless tobacco: Never  Vaping Use   Vaping status: Never Used  Substance and Sexual Activity   Alcohol use: No    Alcohol/week: 0.0 standard drinks of alcohol   Drug use: No   Sexual activity: Not on file  Other Topics Concern   Not on file  Social History Narrative   Lives in retirement community    Social Drivers of Health   Financial Resource Strain: Low Risk  (05/09/2023)   Overall Financial Resource Strain (CARDIA)    Difficulty of Paying Living Expenses: Not hard at all  Food Insecurity: No Food Insecurity (05/13/2023)   Hunger Vital Sign    Worried About Running Out of Food in the Last Year: Never true    Ran Out of Food in the Last Year: Never true  Transportation Needs: No Transportation Needs (05/13/2023)   PRAPARE - Administrator, Civil Service (Medical): No    Lack of Transportation (Non-Medical): No  Physical Activity: Insufficiently Active (05/09/2023)   Exercise Vital Sign    Days of Exercise per Week: 2 days    Minutes of Exercise per Session: 10 min  Stress: No  Stress Concern Present (05/09/2023)   [employer] of Occupational Health - Occupational Stress Questionnaire    Feeling of Stress : Not at all  Social Connections: Socially Integrated (05/09/2023)   Social Connection and Isolation Panel [NHANES]    Frequency of Communication with Friends and Family: More than three times a week    Frequency of Social Gatherings with Friends and Family: Twice a week    Attends Religious Services: More than 4 times per year    Active Member of [employer] or Organizations: Yes    Attends Museum/gallery exhibitions officer: More than 4 times per year    Marital Status: Married  Catering manager Violence: Not At Risk (05/13/2023)   Humiliation, Afraid, Rape, and Kick questionnaire    Fear of Current or Ex-Partner: No    Emotionally Abused: No    Physically Abused: No    Sexually Abused: No    Family History  Problem Relation Age of Onset   Stroke Mother    Cancer Mother        colon cancer   COPD Father    COPD Brother    Hearing loss Son       Review of systems complete and found to be negative unless listed above      PHYSICAL EXAM  General: Well developed, well nourished, in no acute distress HEENT:  Normocephalic and atramatic Neck:  No JVD.  Lungs: Clear bilaterally to auscultation and percussion. Heart: HRRR . Normal S1 and S2 without gallops or murmurs.  Abdomen: Bowel sounds are positive, abdomen soft and non-tender  Msk:  Back normal, normal gait. Normal strength and tone for age. Extremities: No clubbing, cyanosis or edema.   Neuro: Alert and oriented X 3. Psych:  Good affect, responds appropriately  Labs:   Lab Results  Component Value Date   WBC 11.1 (H) 05/12/2023   HGB 16.2 05/12/2023   HCT 46.3 05/12/2023   MCV 85.6 05/12/2023   PLT 161 05/12/2023   No results for input(s): "NA", "K", "CL", "CO2", "BUN", "CREATININE", "CALCIUM", "PROT", "BILITOT", "ALKPHOS", "ALT", "AST", "GLUCOSE" in the last 168 hours.  Invalid input(s): "LABALBU" Lab Results  Component Value Date   TROPONINI <0.03 12/13/2016    Lab Results  Component Value Date   CHOL 217 (H) 02/19/2023   CHOL 238 (H) 02/20/2022   CHOL 206 (H) 01/10/2021   Lab Results  Component Value Date   HDL 36 (L) 02/19/2023   HDL 36 (L) 02/20/2022   HDL 36 (L) 01/10/2021   Lab Results  Component Value Date   LDLCALC 133 (H) 02/19/2023   LDLCALC 146 (H) 02/20/2022   LDLCALC 130 (H) 01/10/2021   Lab Results  Component Value Date   TRIG 337 (H) 02/19/2023   TRIG 363 (H) 02/20/2022    TRIG 245 (H) 01/10/2021   Lab Results  Component Value Date   CHOLHDL 6.0 (H) 02/19/2023   CHOLHDL 6.6 (H) 02/20/2022   CHOLHDL 5.7 (H) 01/10/2021   No results found for: "LDLDIRECT"    Radiology: No results found.  EKG: Normal sinus rhythm ST elevation anterior septally reciprocal depression inferiorly rate of around 95  ASSESSMENT AND PLAN:  Code STEMI Coronary artery disease Diabetes Hypertension Hyperlipidemia Peripheral neuropathy  Plan : STEMI recommend emergent cardiac cath possible PCI and stent Recommend aspirin and Brilinta for 12 months after procedure Statin therapy indefinitely for lipid management Continue diabetes management and control Hypertension management beta-blocker ACE or ARB Refer patient to cardiac rehab  post procedure Have patient follow-up with primary cardiologist 1 to 2 weeks after discharge  Signed: Alwyn Pea MD 06/15/2023, 10:20 AM

## 2023-07-07 DIAGNOSIS — Z961 Presence of intraocular lens: Secondary | ICD-10-CM | POA: Diagnosis not present

## 2023-07-07 DIAGNOSIS — E119 Type 2 diabetes mellitus without complications: Secondary | ICD-10-CM | POA: Diagnosis not present

## 2023-07-07 DIAGNOSIS — H35373 Puckering of macula, bilateral: Secondary | ICD-10-CM | POA: Diagnosis not present

## 2023-07-07 DIAGNOSIS — Z0100 Encounter for examination of eyes and vision without abnormal findings: Secondary | ICD-10-CM | POA: Diagnosis not present

## 2023-07-07 DIAGNOSIS — H2511 Age-related nuclear cataract, right eye: Secondary | ICD-10-CM | POA: Diagnosis not present

## 2023-07-07 LAB — HM DIABETES EYE EXAM

## 2023-07-14 DIAGNOSIS — H2511 Age-related nuclear cataract, right eye: Secondary | ICD-10-CM | POA: Diagnosis not present

## 2023-07-22 ENCOUNTER — Encounter: Payer: Self-pay | Admitting: Ophthalmology

## 2023-07-22 NOTE — Anesthesia Preprocedure Evaluation (Addendum)
 Anesthesia Evaluation  Patient identified by MRN, date of birth, ID band Patient awake    Reviewed: Allergy & Precautions, H&P , NPO status , Patient's Chart, lab work & pertinent test results  Airway Mallampati: II  TM Distance: >3 FB Neck ROM: Full    Dental no notable dental hx. (+) Partial Lower, Partial Upper   Pulmonary neg pulmonary ROS, former smoker   Pulmonary exam normal breath sounds clear to auscultation       Cardiovascular hypertension, + angina  + CAD and + Past MI  negative cardio ROS Normal cardiovascular exam Rhythm:Irregular Rate:Normal  05-10-23 heart cath    LPAV-1 lesion is 100% stenosed.   Ost Cx to Prox Cx lesion is 50% stenosed.   Mid LAD lesion is 99% stenosed.   1st Diag lesion is 50% stenosed.   1st Mrg lesion is 50% stenosed.   3rd Mrg lesion is 50% stenosed.   Prox LAD to Mid LAD lesion is 50% stenosed.   LPAV-2 lesion is 100% stenosed.   Prox RCA to Mid RCA lesion is 50% stenosed.   Ost LAD to Prox LAD lesion is 50% stenosed.   A drug-eluting stent was successfully placed using a STENT ONYX FRONTIER 2.25X18.   Post intervention, there is a 0% residual stenosis.   Post intervention, there is a 0% residual stenosis.   There is mild left ventricular systolic dysfunction.   LV end diastolic pressure is mildly elevated.   The left ventricular ejection fraction is 45-50% by visual estimate.   Recommend uninterrupted dual antiplatelet therapy with Aspirin 81mg  daily and Ticagrelor 90mg  twice daily for a minimum of 12 months (ACS-Class I recommendation).   Conclusion Cardiac cath STEMI presentation from the field Right radial approach   6 French sheath placement right radial artery   Left ventriculogram Mild reduced left ventricular function around 45 to 50% with anterior apical hypo-   Coronaries Left main medium minor irregularities LAD large diffuse 50% proximal ostial 95%  mid Diagonal 1 large 50% ostial to mid Circumflex large 50% proximal mid and distal 100% occluded CTO distal faint collaterals right to left left to left RCA small nondominant moderate disease   Intervention PCI and stent mid LAD 2.25 x 18 mm frontier Onyx Postdilated with a 2.25 x 8 mm Bascom neo to 16 atm Lesion reduced from 99 down to 0% TIMI-3 flow maintained throughout the case Patient maintained on aspirin Brilinta hopefully for at least 12 months Transported to ICU for recovery Dissipate discharge within 24 to 48 hours   05-11-23 echo 1. Ant/apical septal hypo.   2. Left ventricular ejection fraction, by estimation, is 50 to 55%. The  left ventricle has low normal function. The left ventricle demonstrates  regional wall motion abnormalities (see scoring diagram/findings for  description). There is mild asymmetric  left ventricular hypertrophy of the basal-septal segment. Left ventricular  diastolic parameters are consistent with Grade I diastolic dysfunction  (impaired relaxation).   3. Right ventricular systolic function is normal. The right ventricular  size is normal.   4. The mitral valve is normal in structure. No evidence of mitral valve  regurgitation.   5. The aortic valve is normal in structure. Aortic valve regurgitation is  not visualized.   Today, preop, heart in sinus rhythm and sometimes in bigemini, then back to sinus rhythm    Neuro/Psych  Neuromuscular disease negative neurological ROS  negative psych ROS   GI/Hepatic negative GI ROS, Neg liver ROS,,,  Endo/Other  negative endocrine ROSdiabetes    Renal/GU Renal diseasenegative Renal ROS  negative genitourinary   Musculoskeletal negative musculoskeletal ROS (+) Arthritis ,    Abdominal   Peds negative pediatric ROS (+)  Hematology negative hematology ROS (+)   Anesthesia Other Findings Hypertension  Prostate cancer (HCC) Neuropathy  Diabetes mellitus without complication  (HCC) Hyperlipidemia  STEMI (ST elevation myocardial infarction)  and stent 2014 Neuropathy  Wears hearing aid in both ears Wears dentures  Grade I diastolic dysfunction CAD, with stable angina   Reproductive/Obstetrics negative OB ROS                              Anesthesia Physical Anesthesia Plan  ASA: 3  Anesthesia Plan: MAC   Post-op Pain Management:    Induction: Intravenous  PONV Risk Score and Plan:   Airway Management Planned: Natural Airway and Nasal Cannula  Additional Equipment:   Intra-op Plan:   Post-operative Plan:   Informed Consent: I have reviewed the patients History and Physical, chart, labs and discussed the procedure including the risks, benefits and alternatives for the proposed anesthesia with the patient or authorized representative who has indicated his/her understanding and acceptance.     Dental Advisory Given  Plan Discussed with: Anesthesiologist, CRNA and Surgeon  Anesthesia Plan Comments: (Patient consented for risks of anesthesia including but not limited to:  - adverse reactions to medications - damage to eyes, teeth, lips or other oral mucosa - nerve damage due to positioning  - sore throat or hoarseness - Damage to heart, brain, nerves, lungs, other parts of body or loss of life  Patient voiced understanding and assent.)       Anesthesia Quick Evaluation

## 2023-07-28 ENCOUNTER — Other Ambulatory Visit: Payer: Self-pay | Admitting: Family Medicine

## 2023-07-28 DIAGNOSIS — J3089 Other allergic rhinitis: Secondary | ICD-10-CM

## 2023-07-28 NOTE — Discharge Instructions (Signed)

## 2023-07-29 ENCOUNTER — Ambulatory Visit: Payer: Self-pay | Admitting: Anesthesiology

## 2023-07-29 ENCOUNTER — Encounter: Payer: Self-pay | Admitting: Ophthalmology

## 2023-07-29 ENCOUNTER — Ambulatory Visit
Admission: RE | Admit: 2023-07-29 | Discharge: 2023-07-29 | Disposition: A | Discharge status: Home / Self Care | Payer: Medicare Other | Attending: Ophthalmology | Admitting: Ophthalmology

## 2023-07-29 ENCOUNTER — Other Ambulatory Visit: Payer: Self-pay

## 2023-07-29 ENCOUNTER — Encounter
Admission: RE | Disposition: A | Discharge status: Home / Self Care | Payer: Self-pay | Source: Home / Self Care | Attending: Ophthalmology

## 2023-07-29 DIAGNOSIS — H2511 Age-related nuclear cataract, right eye: Secondary | ICD-10-CM | POA: Diagnosis not present

## 2023-07-29 DIAGNOSIS — I10 Essential (primary) hypertension: Secondary | ICD-10-CM | POA: Diagnosis not present

## 2023-07-29 DIAGNOSIS — I252 Old myocardial infarction: Secondary | ICD-10-CM | POA: Insufficient documentation

## 2023-07-29 DIAGNOSIS — Z87891 Personal history of nicotine dependence: Secondary | ICD-10-CM | POA: Insufficient documentation

## 2023-07-29 DIAGNOSIS — Z955 Presence of coronary angioplasty implant and graft: Secondary | ICD-10-CM | POA: Insufficient documentation

## 2023-07-29 DIAGNOSIS — Z7902 Long term (current) use of antithrombotics/antiplatelets: Secondary | ICD-10-CM | POA: Insufficient documentation

## 2023-07-29 DIAGNOSIS — I25118 Atherosclerotic heart disease of native coronary artery with other forms of angina pectoris: Secondary | ICD-10-CM | POA: Diagnosis not present

## 2023-07-29 DIAGNOSIS — Z7984 Long term (current) use of oral hypoglycemic drugs: Secondary | ICD-10-CM | POA: Insufficient documentation

## 2023-07-29 DIAGNOSIS — E1136 Type 2 diabetes mellitus with diabetic cataract: Secondary | ICD-10-CM | POA: Diagnosis not present

## 2023-07-29 DIAGNOSIS — I2510 Atherosclerotic heart disease of native coronary artery without angina pectoris: Secondary | ICD-10-CM | POA: Insufficient documentation

## 2023-07-29 HISTORY — DX: Presence of dental prosthetic device (complete) (partial): Z97.2

## 2023-07-29 HISTORY — DX: Presence of external hearing-aid: Z97.4

## 2023-07-29 HISTORY — DX: Atherosclerotic heart disease of native coronary artery with other forms of angina pectoris: I25.118

## 2023-07-29 HISTORY — PX: CATARACT EXTRACTION W/PHACO: SHX586

## 2023-07-29 HISTORY — DX: Atherosclerotic heart disease of native coronary artery without angina pectoris: I25.10

## 2023-07-29 HISTORY — DX: Other ill-defined heart diseases: I51.89

## 2023-07-29 LAB — GLUCOSE, CAPILLARY: Glucose-Capillary: 156 mg/dL — ABNORMAL HIGH (ref 70–99)

## 2023-07-29 SURGERY — PHACOEMULSIFICATION, CATARACT, WITH IOL INSERTION
Anesthesia: Monitor Anesthesia Care | Site: Eye | Laterality: Right

## 2023-07-29 MED ORDER — FENTANYL CITRATE (PF) 100 MCG/2ML IJ SOLN
INTRAMUSCULAR | Status: DC | PRN
  Administered 2023-07-29: 50 ug via INTRAVENOUS

## 2023-07-29 MED ORDER — SIGHTPATH DOSE#1 NA CHONDROIT SULF-NA HYALURON 40-17 MG/ML IO SOLN
INTRAOCULAR | Status: DC | PRN
  Administered 2023-07-29: 1 mL via INTRAOCULAR

## 2023-07-29 MED ORDER — SIGHTPATH DOSE#1 BSS IO SOLN
INTRAOCULAR | Status: DC | PRN
  Administered 2023-07-29: 15 mL via INTRAOCULAR

## 2023-07-29 MED ORDER — MOXIFLOXACIN HCL 0.5 % OP SOLN
OPHTHALMIC | Status: DC | PRN
  Administered 2023-07-29: .2 mL via OPHTHALMIC

## 2023-07-29 MED ORDER — BRIMONIDINE TARTRATE-TIMOLOL 0.2-0.5 % OP SOLN
OPHTHALMIC | Status: DC | PRN
  Administered 2023-07-29: 1 [drp] via OPHTHALMIC

## 2023-07-29 MED ORDER — MIDAZOLAM HCL 2 MG/2ML IJ SOLN
INTRAMUSCULAR | Status: AC
  Filled 2023-07-29: qty 2

## 2023-07-29 MED ORDER — FENTANYL CITRATE (PF) 100 MCG/2ML IJ SOLN
INTRAMUSCULAR | Status: AC
  Filled 2023-07-29: qty 2

## 2023-07-29 MED ORDER — TETRACAINE HCL 0.5 % OP SOLN
OPHTHALMIC | Status: AC
  Filled 2023-07-29: qty 4

## 2023-07-29 MED ORDER — ARMC OPHTHALMIC DILATING DROPS
1.0000 | OPHTHALMIC | Status: DC | PRN
  Administered 2023-07-29 (×3): 1 via OPHTHALMIC

## 2023-07-29 MED ORDER — SIGHTPATH DOSE#1 BSS IO SOLN
INTRAOCULAR | Status: DC | PRN
  Administered 2023-07-29: 66 mL via OPHTHALMIC

## 2023-07-29 MED ORDER — TETRACAINE HCL 0.5 % OP SOLN
1.0000 [drp] | OPHTHALMIC | Status: DC | PRN
  Administered 2023-07-29 (×3): 1 [drp] via OPHTHALMIC

## 2023-07-29 MED ORDER — MIDAZOLAM HCL 2 MG/2ML IJ SOLN
INTRAMUSCULAR | Status: DC | PRN
  Administered 2023-07-29: 1 mg via INTRAVENOUS

## 2023-07-29 MED ORDER — ARMC OPHTHALMIC DILATING DROPS
OPHTHALMIC | Status: AC
  Filled 2023-07-29: qty 0.5

## 2023-07-29 MED ORDER — SIGHTPATH DOSE#1 BSS IO SOLN
INTRAOCULAR | Status: DC | PRN
  Administered 2023-07-29: 2 mL

## 2023-07-29 SURGICAL SUPPLY — 13 items
CATARACT SUITE SIGHTPATH (MISCELLANEOUS) ×2 IMPLANT
CYSTOTOME ANG REV CUT SHRT 25G (CUTTER) ×2 IMPLANT
CYSTOTOME ANGL RVRS SHRT 25G (CUTTER) ×1 IMPLANT
FEE CATARACT SUITE SIGHTPATH (MISCELLANEOUS) ×1 IMPLANT
GLOVE BIOGEL PI IND STRL 8 (GLOVE) ×2 IMPLANT
GLOVE SURG LX STRL 8.0 MICRO (GLOVE) ×2 IMPLANT
GLOVE SURG PROTEXIS BL SZ6.5 (GLOVE) ×2 IMPLANT
GLOVE SURG SYN 6.5 PF PI BL (GLOVE) ×1 IMPLANT
LENS IOL CLRN 22.0 (Intraocular Lens) IMPLANT
LENS IOL MONO CLAREON 22.0 (Intraocular Lens) ×2 IMPLANT
NDL FILTER BLUNT 18X1 1/2 (NEEDLE) ×1 IMPLANT
NEEDLE FILTER BLUNT 18X1 1/2 (NEEDLE) ×2 IMPLANT
SYR 3ML LL SCALE MARK (SYRINGE) ×2 IMPLANT

## 2023-07-29 NOTE — Telephone Encounter (Signed)
 Requested medication (s) are due for refill today: yes  Requested medication (s) are on the active medication list: yes  Last refill:  04/15/23  Future visit scheduled: yes  Notes to clinic:  historical provider      Requested Prescriptions  Pending Prescriptions Disp Refills   montelukast (SINGULAIR) 10 MG tablet [Pharmacy Med Name: MONTELUKAST SOD 10 MG TABLET] 90 tablet 0    Sig: Take 1 tablet (10 mg total) by mouth at bedtime.     Pulmonology:  Leukotriene Inhibitors Passed - 07/29/2023  9:57 AM      Passed - Valid encounter within last 12 months    Recent Outpatient Visits           2 months ago History of MI (myocardial infarction)   [place] [facility] [place], Netta Neat, DO   2 months ago Constipation, unspecified constipation type   [place] [facility] [name], DO   3 months ago Diverticulitis   [place] [facility], Salvadore Oxford, NP   5 months ago Annual physical exam   [place] [facility] [name], DO   1 year ago Type 2 diabetes mellitus with diabetic polyneuropathy, without long-term current use of insulin [facility])   [place] [facility] [name], DO       Future Appointments             In 1 month Althea Charon, Netta Neat, DO [facility]

## 2023-07-29 NOTE — Op Note (Signed)
 PREOPERATIVE DIAGNOSIS:  Nuclear sclerotic cataract of the right eye.   POSTOPERATIVE DIAGNOSIS:  Cataract   OPERATIVE PROCEDURE:ORPROCALL@   SURGEON:  Galen Manila, MD.   ANESTHESIA:  Anesthesiologist: Marisue Humble, MD CRNA: Barbette Hair, CRNA  1.      Managed anesthesia care. 2.      0.69ml of Shugarcaine was instilled in the eye following the paracentesis.   COMPLICATIONS:  None.   TECHNIQUE:   Stop and chop   DESCRIPTION OF PROCEDURE:  The patient was examined and consented in the preoperative holding area where the aforementioned topical anesthesia was applied to the right eye and then brought back to the Operating Room where the right eye was prepped and draped in the usual sterile ophthalmic fashion and a lid speculum was placed. A paracentesis was created with the side port blade and the anterior chamber was filled with viscoelastic. A near clear corneal incision was performed with the steel keratome. A continuous curvilinear capsulorrhexis was performed with a cystotome followed by the capsulorrhexis forceps. Hydrodissection and hydrodelineation were carried out with BSS on a blunt cannula. The lens was removed in a stop and chop  technique and the remaining cortical material was removed with the irrigation-aspiration handpiece. The capsular bag was inflated with viscoelastic and the Technis [id number]  lens was placed in the capsular bag without complication. The remaining viscoelastic was removed from the eye with the irrigation-aspiration handpiece. The wounds were hydrated. The anterior chamber was flushed with BSS and the eye was inflated to physiologic pressure. 0.92ml of Vigamox was placed in the anterior chamber. The wounds were found to be water tight. The eye was dressed with Combigan. The patient was given protective glasses to wear throughout the day and a shield with which to sleep tonight. The patient was also given drops with which to begin a drop regimen today and will  follow-up with me in one day. Implant Name Type Inv. Item Serial No. Manufacturer Lot No. LRB No. Used Action  LENS IOL MONO CLAREON 22.0 - [id number] Intraocular Lens LENS IOL MONO CLAREON 22.0 [id number] [employer]  Right 1 Implanted   Procedure(s): CATARACT EXTRACTION PHACO AND INTRAOCULAR LENS PLACEMENT (IOC) RIGHT 10.04 01:04.1 (Right)  Electronically signed: Galen Manila 07/29/2023 9:18 AM

## 2023-07-29 NOTE — H&P (Signed)
 [facility]   Primary Care Physician:  [name], DO Ophthalmologist: Dr. Maren Reamer  Pre-Procedure History & Physical: HPI:  [name] is a 89 y.o. male here for cataract surgery.   Past Medical History:  Diagnosis Date   CAD (coronary artery disease)    Coronary artery disease of native artery of native heart with stable angina pectoris (HCC)    Diabetes mellitus without complication (HCC)    Grade I diastolic dysfunction    Hyperlipidemia    Hypertension    Neuropathy    Neuropathy    feet   Prostate cancer (HCC)    STEMI (ST elevation myocardial infarction) (HCC) 04/2023   Wears dentures    partial upper and lower   Wears hearing aid in both ears     Past Surgical History:  Procedure Laterality Date   CATARACT EXTRACTION W/PHACO Left 04/24/2015   Procedure: CATARACT EXTRACTION PHACO AND INTRAOCULAR LENS PLACEMENT (IOC);  Surgeon: Sallee Lange, MD;  Location: [facility] ORS;  Service: Ophthalmology;  Laterality: Left;  [place] 01:12 AP% 25.7 CDE 28.30 fluid pack lot #[id number]   COLONOSCOPY     CORONARY/GRAFT ACUTE MI REVASCULARIZATION N/A 05/10/2023   Procedure: Coronary/Graft Acute MI Revascularization;  Surgeon: Alwyn Pea, MD;  Location: [facility] INVASIVE CV LAB;  Service: Cardiovascular;  Laterality: N/A;   LEFT HEART CATH AND CORONARY ANGIOGRAPHY N/A 05/10/2023   Procedure: LEFT HEART CATH AND CORONARY ANGIOGRAPHY;  Surgeon: Alwyn Pea, MD;  Location: [facility] INVASIVE CV LAB;  Service: Cardiovascular;  Laterality: N/A;    Prior to Admission medications   Medication Sig Start Date End Date Taking? Authorizing Provider  aspirin 81 MG chewable tablet Chew 1 tablet (81 mg total) by mouth daily. 05/13/23  Yes Wouk, Wilfred Curtis, MD  azelastine (ASTELIN) 0.1 % nasal spray Place 2 sprays into both nostrils 2 (two) times daily. Use in each nostril as directed Patient taking differently: Place 2 sprays into both nostrils 2 (two) times daily.  Use in each nostril as directed PRN 02/27/22  Yes Karamalegos, Netta Neat, DO  clopidogrel (PLAVIX) 75 MG tablet Take 75 mg by mouth at bedtime.   Yes [provider]  colchicine 0.6 MG tablet If acute flare only - Day 1: take 2 pills first day, then repeat 1 pill within two hours. Day 2 - take 1 pill daily for up to max 7 days or stop when gout flare resolves. 08/18/20  Yes Karamalegos, Netta Neat, DO  diclofenac Sodium (VOLTAREN) 1 % GEL Apply 2 g topically 4 (four) times daily as needed (elbow bursitis). 01/23/22  Yes Karamalegos, Netta Neat, DO  ezetimibe (ZETIA) 10 MG tablet Take 1 tablet (10 mg total) by mouth daily. 02/27/23  Yes Karamalegos, Netta Neat, DO  fluticasone (FLONASE) 50 MCG/ACT nasal spray Place 2 sprays into both nostrils daily. Use for 4-6 weeks then stop and use seasonally or as needed. 01/24/21  Yes Karamalegos, Netta Neat, DO  metFORMIN (GLUCOPHAGE-XR) 500 MG 24 hr tablet Take 1 tablet (500 mg total) by mouth daily with breakfast. 02/27/23  Yes Karamalegos, Netta Neat, DO  metoprolol succinate (TOPROL-XL) 25 MG 24 hr tablet Take 1 tablet (25 mg total) by mouth daily. Take with or immediately following a meal. 05/13/23  Yes Wouk, Wilfred Curtis, MD  montelukast (SINGULAIR) 10 MG tablet Take 10 mg by mouth daily. 04/14/23  Yes [provider]  polyethylene glycol powder (GLYCOLAX/MIRALAX) 17 GM/SCOOP powder Take 17-34 g by mouth daily as needed for moderate  constipation or mild constipation. 05/06/23  Yes Karamalegos, Netta Neat, DO  pregabalin (LYRICA) 75 MG capsule Take 1 capsule (75 mg total) by mouth 2 (two) times daily. 06/10/23  Yes Karamalegos, Netta Neat, DO  rosuvastatin (CRESTOR) 40 MG tablet Take 1 tablet (40 mg total) by mouth daily. 05/13/23  Yes Wouk, Wilfred Curtis, MD  ticagrelor (BRILINTA) 90 MG TABS tablet Take 1 tablet (90 mg total) by mouth 2 (two) times daily. 05/12/23  Yes Wouk, Wilfred Curtis, MD    Allergies as of 07/09/2023   (No Known  Allergies)    Family History  Problem Relation Age of Onset   Stroke Mother    Cancer Mother        colon cancer   COPD Father    COPD Brother    Hearing loss Son     Social History   Socioeconomic History   Marital status: Widowed    Spouse name: Not on file   Number of children: Not on file   Years of education: Not on file   Highest education level: High school graduate  Occupational History   Not on file  Tobacco Use   Smoking status: Former    Current packs/day: 0.00    Types: Cigarettes    Quit date: 05/27/1978    Years since quitting: 45.2   Smokeless tobacco: Never  Vaping Use   Vaping status: Never Used  Substance and Sexual Activity   Alcohol use: No    Alcohol/week: 0.0 standard drinks of alcohol   Drug use: No   Sexual activity: Not on file  Other Topics Concern   Not on file  Social History Narrative   Lives in retirement community    Social Drivers of Health   Financial Resource Strain: Low Risk  (05/09/2023)   Overall Financial Resource Strain (CARDIA)    Difficulty of Paying Living Expenses: Not hard at all  Food Insecurity: No Food Insecurity (05/13/2023)   Hunger Vital Sign    Worried About Running Out of Food in the Last Year: Never true    Ran Out of Food in the Last Year: Never true  Transportation Needs: No Transportation Needs (05/13/2023)   PRAPARE - Administrator, Civil Service (Medical): No    Lack of Transportation (Non-Medical): No  Physical Activity: Insufficiently Active (05/09/2023)   Exercise Vital Sign    Days of Exercise per Week: 2 days    Minutes of Exercise per Session: 10 min  Stress: No Stress Concern Present (05/09/2023)   [employer] of Occupational Health - Occupational Stress Questionnaire    Feeling of Stress : Not at all  Social Connections: Socially Integrated (05/09/2023)   Social Connection and Isolation Panel [NHANES]    Frequency of Communication with Friends and Family: More than three  times a week    Frequency of Social Gatherings with Friends and Family: Twice a week    Attends Religious Services: More than 4 times per year    Active Member of [employer] or Organizations: Yes    Attends Engineer, structural: More than 4 times per year    Marital Status: Married  Catering manager Violence: Not At Risk (05/13/2023)   Humiliation, Afraid, Rape, and Kick questionnaire    Fear of Current or Ex-Partner: No    Emotionally Abused: No    Physically Abused: No    Sexually Abused: No    Review of Systems: See HPI, otherwise negative ROS  Physical Exam: BP Marland Kitchen)  157/62   Pulse 78   Temp 98.1 F (36.7 C) (Temporal)   Resp 12   Ht 5\' 10"  (1.778 m)   Wt 73.5 kg   SpO2 97%   BMI 23.24 kg/m  General:   Alert, cooperative in NAD Head:  Normocephalic and atraumatic. Respiratory:  Normal work of breathing. Cardiovascular:  RRR  Impression/Plan: [name] is here for cataract surgery.  Risks, benefits, limitations, and alternatives regarding cataract surgery have been reviewed with the patient.  Questions have been answered.  All parties agreeable.   Galen Manila, MD  07/29/2023, 8:24 AM

## 2023-07-29 NOTE — Transfer of Care (Signed)
 Immediate Anesthesia Transfer of Care Note  Patient: [name]  Procedure(s) Performed: CATARACT EXTRACTION PHACO AND INTRAOCULAR LENS PLACEMENT (IOC) RIGHT 10.04 01:04.1 (Right: Eye)  Patient Location: PACU  Anesthesia Type: MAC  Level of Consciousness: awake, alert  and patient cooperative  Airway and Oxygen Therapy: Patient Spontanous Breathing and Patient connected to supplemental oxygen  Post-op Assessment: Post-op Vital signs reviewed, Patient's Cardiovascular Status Stable, Respiratory Function Stable, Patent Airway and No signs of Nausea or vomiting  Post-op Vital Signs: Reviewed and stable  Complications: No notable events documented.

## 2023-07-29 NOTE — Anesthesia Postprocedure Evaluation (Signed)
 Anesthesia Post Note  Patient: BRYNDEN THUNE  Procedure(s) Performed: CATARACT EXTRACTION PHACO AND INTRAOCULAR LENS PLACEMENT (IOC) RIGHT 10.04 01:04.1 (Right: Eye)  Patient location during evaluation: PACU Anesthesia Type: MAC Level of consciousness: awake and alert Pain management: pain level controlled Vital Signs Assessment: post-procedure vital signs reviewed and stable Respiratory status: spontaneous breathing, nonlabored ventilation, respiratory function stable and patient connected to nasal cannula oxygen Cardiovascular status: stable and blood pressure returned to baseline Postop Assessment: no apparent nausea or vomiting Anesthetic complications: no   No notable events documented.   Last Vitals:  Vitals:   07/29/23 0918 07/29/23 0924  BP: (!) 129/92 125/72  Pulse: 71 71  Resp: 15 16  Temp: 36.6 C 36.6 C  SpO2: 96% 94%    Last Pain:  Vitals:   07/29/23 0924  TempSrc:   PainSc: 0-No pain                 Marisue Humble

## 2023-07-30 ENCOUNTER — Encounter: Payer: Self-pay | Admitting: Ophthalmology

## 2023-07-31 ENCOUNTER — Ambulatory Visit: Payer: Medicare HMO | Admitting: Podiatry

## 2023-07-31 ENCOUNTER — Encounter: Payer: Self-pay | Admitting: Podiatry

## 2023-07-31 VITALS — Ht 70.0 in | Wt 162.0 lb

## 2023-07-31 DIAGNOSIS — M79674 Pain in right toe(s): Secondary | ICD-10-CM

## 2023-07-31 DIAGNOSIS — B351 Tinea unguium: Secondary | ICD-10-CM | POA: Diagnosis not present

## 2023-07-31 DIAGNOSIS — E119 Type 2 diabetes mellitus without complications: Secondary | ICD-10-CM

## 2023-07-31 DIAGNOSIS — M79675 Pain in left toe(s): Secondary | ICD-10-CM | POA: Diagnosis not present

## 2023-07-31 DIAGNOSIS — Z0189 Encounter for other specified special examinations: Secondary | ICD-10-CM

## 2023-07-31 DIAGNOSIS — E1142 Type 2 diabetes mellitus with diabetic polyneuropathy: Secondary | ICD-10-CM | POA: Diagnosis not present

## 2023-07-31 DIAGNOSIS — M19072 Primary osteoarthritis, left ankle and foot: Secondary | ICD-10-CM | POA: Diagnosis not present

## 2023-07-31 DIAGNOSIS — M19071 Primary osteoarthritis, right ankle and foot: Secondary | ICD-10-CM

## 2023-08-04 NOTE — Progress Notes (Signed)
 ANNUAL DIABETIC FOOT EXAM  Subjective: [name] presents today for annual diabetic foot exam. Chief Complaint  Patient presents with   Nail Problem    Pt is here for [facility] ;ast A1C was 7.1 PCP is Dr Althea Charon and LOV was in December.   Patient confirms h/o diabetes.  Patient denies any h/o foot wounds.  Patient has been diagnosed with neuropathy.  [name], DO is patient's PCP.  Past Medical History:  Diagnosis Date   CAD (coronary artery disease)    Coronary artery disease of native artery of native heart with stable angina pectoris (HCC)    Diabetes mellitus without complication (HCC)    Grade I diastolic dysfunction    Hyperlipidemia    Hypertension    Neuropathy    Neuropathy    feet   Prostate cancer (HCC)    STEMI (ST elevation myocardial infarction) (HCC) 04/2023   Wears dentures    partial upper and lower   Wears hearing aid in both ears    Patient Active Problem List   Diagnosis Date Noted   Essential hypertension 05/11/2023   Dyslipidemia 05/11/2023   Type 2 diabetes mellitus with peripheral neuropathy (HCC) 05/11/2023   Drug-induced myopathy 12/23/2017   Chronic left shoulder pain 03/24/2017   Primary osteoarthritis involving multiple joints 03/24/2017   Polyneuropathy associated with underlying disease (HCC) 11/07/2016   Allergic rhinitis 11/07/2016   Peripheral neuropathy 05/13/2016   Benign hypertension with CKD (chronic kidney disease) stage III (HCC) 01/19/2015   Type 2 diabetes mellitus with diabetic polyneuropathy (HCC) 01/19/2015   Hyperlipidemia associated with type 2 diabetes mellitus (HCC) 01/19/2015   Personal history of prostate cancer 01/19/2015   Nocturia 01/19/2015   Past Surgical History:  Procedure Laterality Date   CATARACT EXTRACTION W/PHACO Left 04/24/2015   Procedure: CATARACT EXTRACTION PHACO AND INTRAOCULAR LENS PLACEMENT (IOC);  Surgeon: Sallee Lange, MD;  Location: [facility] ORS;  Service:  Ophthalmology;  Laterality: Left;  [place] 01:12 AP% 25.7 CDE 28.30 fluid pack lot #[id number] H   CATARACT EXTRACTION W/PHACO Right 07/29/2023   Procedure: CATARACT EXTRACTION PHACO AND INTRAOCULAR LENS PLACEMENT (IOC) RIGHT 10.04 01:04.1;  Surgeon: Galen Manila, MD;  [facility];  Service: Ophthalmology;  Laterality: Right;   COLONOSCOPY     CORONARY/GRAFT ACUTE MI REVASCULARIZATION N/A 05/10/2023   Procedure: Coronary/Graft Acute MI Revascularization;  Surgeon: Alwyn Pea, MD;  Location: [facility] INVASIVE CV LAB;  Service: Cardiovascular;  Laterality: N/A;   LEFT HEART CATH AND CORONARY ANGIOGRAPHY N/A 05/10/2023   Procedure: LEFT HEART CATH AND CORONARY ANGIOGRAPHY;  Surgeon: Alwyn Pea, MD;  Location: [facility] INVASIVE CV LAB;  Service: Cardiovascular;  Laterality: N/A;   Current Outpatient Medications on File Prior to Visit  Medication Sig Dispense Refill   aspirin 81 MG chewable tablet Chew 1 tablet (81 mg total) by mouth daily. 90 tablet 1   azelastine (ASTELIN) 0.1 % nasal spray Place 2 sprays into both nostrils 2 (two) times daily. Use in each nostril as directed (Patient taking differently: Place 2 sprays into both nostrils 2 (two) times daily. Use in each nostril as directed PRN) 30 mL 12   clopidogrel (PLAVIX) 75 MG tablet Take 75 mg by mouth at bedtime.     colchicine 0.6 MG tablet If acute flare only - Day 1: take 2 pills first day, then repeat 1 pill within two hours. Day 2 - take 1 pill daily for up to max 7 days or stop when gout flare resolves.  20 tablet 2   diclofenac Sodium (VOLTAREN) 1 % GEL Apply 2 g topically 4 (four) times daily as needed (elbow bursitis). 100 g 2   ezetimibe (ZETIA) 10 MG tablet Take 1 tablet (10 mg total) by mouth daily. 90 tablet 3   fluticasone (FLONASE) 50 MCG/ACT nasal spray Place 2 sprays into both nostrils daily. Use for 4-6 weeks then stop and use seasonally or as needed. 16 g 3   metFORMIN (GLUCOPHAGE-XR) 500 MG 24 hr tablet  Take 1 tablet (500 mg total) by mouth daily with breakfast. 90 tablet 3   metoprolol succinate (TOPROL-XL) 25 MG 24 hr tablet Take 1 tablet (25 mg total) by mouth daily. Take with or immediately following a meal. 60 tablet 1   montelukast (SINGULAIR) 10 MG tablet Take 1 tablet (10 mg total) by mouth at bedtime. 90 tablet 3   polyethylene glycol powder (GLYCOLAX/MIRALAX) 17 GM/SCOOP powder Take 17-34 g by mouth daily as needed for moderate constipation or mild constipation. 238 g 1   pregabalin (LYRICA) 75 MG capsule Take 1 capsule (75 mg total) by mouth 2 (two) times daily. 180 capsule 1   rosuvastatin (CRESTOR) 40 MG tablet Take 1 tablet (40 mg total) by mouth daily. 90 tablet 1   ticagrelor (BRILINTA) 90 MG TABS tablet Take 1 tablet (90 mg total) by mouth 2 (two) times daily. 180 tablet 1   No current facility-administered medications on file prior to visit.    No Known Allergies Social History   Occupational History   Not on file  Tobacco Use   Smoking status: Former    Current packs/day: 0.00    Types: Cigarettes    Quit date: 05/27/1978    Years since quitting: 45.2   Smokeless tobacco: Never  Vaping Use   Vaping status: Never Used  Substance and Sexual Activity   Alcohol use: No    Alcohol/week: 0.0 standard drinks of alcohol   Drug use: No   Sexual activity: Not on file   Family History  Problem Relation Age of Onset   Stroke Mother    Cancer Mother        colon cancer   COPD Father    COPD Brother    Hearing loss Son    Immunization History  Administered Date(s) Administered   Fluad Quad(high Dose 65+) 02/27/2022, 02/27/2023   Influenza, High Dose Seasonal PF 03/20/2016   Influenza-Unspecified 03/23/2015, 03/11/2017, 02/26/2018   Moderna Sars-Covid-2 Vaccination 06/29/2019, 07/27/2019   Pneumococcal Conjugate-13 03/20/2016   Pneumococcal Polysaccharide-23 05/07/2013     Review of Systems: Negative except as noted in the HPI.   Objective: There were no vitals  filed for this visit.  [name] is a pleasant 89 y.o. male in NAD. AAO X 3.  Diabetic foot exam was performed with the following findings:   Vascular Examination: Capillary refill time immediate b/l. Vascular status intact b/l with palpable pedal pulses. Pedal hair present b/l. No pain with calf compression b/l. Skin temperature gradient WNL b/l. No cyanosis or clubbing b/l. No ischemia or gangrene noted b/l.   Neurological Examination: Sensation grossly intact b/l with 10 gram monofilament. Vibratory sensation intact b/l. Pt has subjective symptoms of neuropathy.  Dermatological Examination: Pedal skin with normal turgor, texture and tone b/l.  No open wounds. No interdigital macerations.   Toenails 1-5 b/l thick, discolored, elongated with subungual debris and pain on dorsal palpation.   No corns, calluses nor porokeratotic lesions noted.  Musculoskeletal Examination: Muscle strength 5/5  to all lower extremity muscle groups bilaterally. No pain, crepitus or joint limitation noted with ROM bilateral LE. Palpable exostosis noted dorsal 1st met cuneiform joint of both feet.  Radiographs: None     Lab Results  Component Value Date   HGBA1C 7.3 (H) 02/19/2023   ADA Risk Categorization: Low Risk :  Patient has all of the following: Intact protective sensation No prior foot ulcer  No severe deformity Pedal pulses present  Assessment: 1. Pain due to onychomycosis of toenails of both feet   2. Primary osteoarthritis of both feet   3. Diabetic peripheral neuropathy associated with type 2 diabetes mellitus (HCC)   4. Encounter for diabetic foot exam [facility])     Plan: Patient was evaluated and treated. All patient's and/or POA's questions/concerns addressed on today's visit. 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  Continue daily foot inspections and monitor blood glucose per PCP/Endocrinologist's recommendations.Continue soft, supportive shoe gear  daily. Report any pedal injuries to medical professional. Call office if there are any quesitons/concerns. -Patient/POA to call should there be question/concern in the interim. Return in about 3 months (around 10/31/2023).  Freddie Breech, DPM      [place] LOCATION: [address], [place] [address]                   Office [phone]   [facility] LOCATION: [address] [place], [place] [address] Office [phone]

## 2023-08-11 DIAGNOSIS — I2510 Atherosclerotic heart disease of native coronary artery without angina pectoris: Secondary | ICD-10-CM | POA: Diagnosis not present

## 2023-08-11 DIAGNOSIS — E782 Mixed hyperlipidemia: Secondary | ICD-10-CM | POA: Diagnosis not present

## 2023-08-11 DIAGNOSIS — I252 Old myocardial infarction: Secondary | ICD-10-CM | POA: Diagnosis not present

## 2023-08-11 DIAGNOSIS — I10 Essential (primary) hypertension: Secondary | ICD-10-CM | POA: Diagnosis not present

## 2023-08-25 DIAGNOSIS — Z961 Presence of intraocular lens: Secondary | ICD-10-CM | POA: Diagnosis not present

## 2023-08-26 DIAGNOSIS — Z961 Presence of intraocular lens: Secondary | ICD-10-CM | POA: Diagnosis not present

## 2023-08-28 ENCOUNTER — Other Ambulatory Visit: Payer: Self-pay | Admitting: Family Medicine

## 2023-08-28 ENCOUNTER — Encounter: Payer: Self-pay | Admitting: Family Medicine

## 2023-08-28 ENCOUNTER — Ambulatory Visit (INDEPENDENT_AMBULATORY_CARE_PROVIDER_SITE_OTHER): Payer: Self-pay | Admitting: Family Medicine

## 2023-08-28 VITALS — BP 128/72 | HR 47 | Ht 70.0 in | Wt 166.0 lb

## 2023-08-28 DIAGNOSIS — E1142 Type 2 diabetes mellitus with diabetic polyneuropathy: Secondary | ICD-10-CM

## 2023-08-28 DIAGNOSIS — Z7984 Long term (current) use of oral hypoglycemic drugs: Secondary | ICD-10-CM | POA: Diagnosis not present

## 2023-08-28 DIAGNOSIS — Z0000 Encounter for general adult medical examination without abnormal findings: Secondary | ICD-10-CM

## 2023-08-28 DIAGNOSIS — I129 Hypertensive chronic kidney disease with stage 1 through stage 4 chronic kidney disease, or unspecified chronic kidney disease: Secondary | ICD-10-CM

## 2023-08-28 DIAGNOSIS — E1169 Type 2 diabetes mellitus with other specified complication: Secondary | ICD-10-CM

## 2023-08-28 DIAGNOSIS — R351 Nocturia: Secondary | ICD-10-CM

## 2023-08-28 DIAGNOSIS — I25118 Atherosclerotic heart disease of native coronary artery with other forms of angina pectoris: Secondary | ICD-10-CM

## 2023-08-28 DIAGNOSIS — E538 Deficiency of other specified B group vitamins: Secondary | ICD-10-CM

## 2023-08-28 LAB — POCT GLYCOSYLATED HEMOGLOBIN (HGB A1C): Hemoglobin A1C: 7.5 % — AB (ref 4.0–5.6)

## 2023-08-28 NOTE — Patient Instructions (Addendum)
 Thank you for coming to the office today.  Recent Labs    02/19/23 0830 08/28/23 1054  HGBA1C 7.3* 7.5*    DUE for FASTING BLOOD WORK (no food or drink after midnight before the lab appointment, only water or coffee without cream/sugar on the morning of)  SCHEDULE "Lab Only" visit in the morning at the clinic for lab draw in 6 MONTHS   - Make sure Lab Only appointment is at about 1 week before your next appointment, so that results will be available  For Lab Results, once available within 2-3 days of blood draw, you can can log in to MyChart online to view your results and a brief explanation. Also, we can discuss results at next follow-up visit.   Please schedule a Follow-up Appointment to: Return for 6 month fasting lab > 1 week later Annual Physical.  If you have any other questions or concerns, please feel free to call the office or send a message through MyChart. You may also schedule an earlier appointment if necessary.  Additionally, you may be receiving a survey about your experience at our office within a few days to 1 week by e-mail or mail. We value your feedback.  Saralyn Pilar, DO [facility], [place]

## 2023-08-28 NOTE — Progress Notes (Signed)
 Subjective:    Patient ID: [name], male    DOB: 12/20/1933, 89 y.o.   MRN: [id number]  [name] is a 89 y.o. male presenting on 08/28/2023 for Diabetes   HPI  Discussed the use of AI scribe software for clinical note transcription with the patient, who gave verbal consent to proceed.  History of Present Illness   [name] is an 89 year old male with type 2 diabetes and coronary artery disease who presents for a follow-up on blood sugar control.  His hemoglobin A1c has increased slightly from 7.3% to 7.5% over the past six months, with a previous A1c of 7.6%. He has not made significant changes in his diet, although he mentions having a dessert recently. He has been on a modified diet since having a stent placed, avoiding red meat and primarily consuming fish and chicken. He feels some fatigue with this dietary change.  He has a history of coronary artery disease and had a stent placed, which led to dietary modifications. He no longer experiences shortness of breath or chest irritation since the stent placement. He is currently on two blood thinners, including a baby aspirin, as part of his post-stent management.  He uses protein drinks to help regulate his blood sugar, having switched from an energy drink with 30 grams of protein to a supplement drink with 15 grams of protein, vitamins, and minerals. He occasionally takes a B vitamin supplement for circulation issues in his legs and feet, which he describes as numb and stinging. He visits a podiatrist for toenail care and reports that circulation in his feet is satisfactory according to the podiatrist's assessment.  He underwent cataract surgery recently and is awaiting new glasses. He describes the past year as challenging due to the heart attack and cataract surgery.      Type 2 Diabetes with Neuropathy Hyperlipidemia Hypertension Followed by Podiatrist for nail trimming and he has good circulation but neuropathy  with pain and numb. Using nerve cream for feet He has diabetic shoes He is on Metformin XR 500mg  daily On Lisinopril low dose 5mg . On Lyrica Pregabalin 75mg  TWICE A DAY - limited relief, unsure if helping. Failed Gabapentin and nerve supplement.       08/28/2023   10:48 AM 05/09/2023   10:33 AM 05/06/2023    8:35 AM  Depression screen PHQ 2/9  Decreased Interest 0 0 0  Down, Depressed, Hopeless 0 0 0  PHQ - 2 Score 0 0 0  Altered sleeping 0 0 0  Tired, decreased energy 0 0 1  Change in appetite 0 0 0  Feeling bad or failure about yourself  0 0 0  Trouble concentrating 0 0 0  Moving slowly or fidgety/restless 0 0 0  Suicidal thoughts 0 0 0  PHQ-9 Score 0 0 1       08/28/2023   10:49 AM 05/06/2023    8:35 AM 04/15/2023   11:45 AM 02/27/2023   11:11 AM  GAD 7 : Generalized Anxiety Score  Nervous, Anxious, on Edge 0 1 0 0  Control/stop worrying 0 1 1 0  Worry too much - different things 0 1 1 0  Trouble relaxing 0 0 0 0  Restless 0 0 0 0  Easily annoyed or irritable 0 1 0 0  Afraid - awful might happen 0 0 0 0  Total GAD 7 Score 0 4 2 0  Anxiety Difficulty   Somewhat difficult  Social History   Tobacco Use   Smoking status: Former    Current packs/day: 0.00    Types: Cigarettes    Quit date: 05/27/1978    Years since quitting: 45.2   Smokeless tobacco: Never  Vaping Use   Vaping status: Never Used  Substance Use Topics   Alcohol use: No    Alcohol/week: 0.0 standard drinks of alcohol   Drug use: No    Review of Systems Per HPI unless specifically indicated above     Objective:    BP 128/72 (BP Location: Right Arm, Patient Position: Sitting, Cuff Size: Normal)   Pulse (!) 47   Ht 5\' 10"  (1.778 m)   Wt 166 lb (75.3 kg)   SpO2 99%   BMI 23.82 kg/m   Wt Readings from Last 3 Encounters:  08/28/23 166 lb (75.3 kg)  07/31/23 162 lb (73.5 kg)  07/29/23 162 lb (73.5 kg)    Physical Exam Vitals and nursing note reviewed.  Constitutional:      General:  He is not in acute distress.    Appearance: He is well-developed. He is not diaphoretic.     Comments: Well-appearing, comfortable, cooperative  HENT:     Head: Normocephalic and atraumatic.  Eyes:     General:        Right eye: No discharge.        Left eye: No discharge.     Conjunctiva/sclera: Conjunctivae normal.  Neck:     Thyroid: No thyromegaly.  Cardiovascular:     Rate and Rhythm: Normal rate and regular rhythm.     Pulses: Normal pulses.     Heart sounds: Normal heart sounds. No murmur heard. Pulmonary:     Effort: Pulmonary effort is normal. No respiratory distress.     Breath sounds: Normal breath sounds. No wheezing or rales.  Musculoskeletal:        General: Normal range of motion.     Cervical back: Normal range of motion and neck supple.  Lymphadenopathy:     Cervical: No cervical adenopathy.  Skin:    General: Skin is warm and dry.     Findings: No erythema or rash.  Neurological:     Mental Status: He is alert and oriented to person, place, and time. Mental status is at baseline.  Psychiatric:        Behavior: Behavior normal.     Comments: Well groomed, good eye contact, normal speech and thoughts     Results for orders placed or performed in visit on 08/28/23  POCT HgB A1C   Collection Time: 08/28/23 10:54 AM  Result Value Ref Range   Hemoglobin A1C 7.5 (A) 4.0 - 5.6 %   HbA1c POC (<> result, manual entry)     HbA1c, POC (prediabetic range)     HbA1c, POC (controlled diabetic range)        Assessment & Plan:   Problem List Items Addressed This Visit     Type 2 diabetes mellitus with diabetic polyneuropathy (HCC) - Primary   Relevant Orders   POCT HgB A1C (Completed)   Other Visit Diagnoses       Long term current use of oral hypoglycemic drug            Coronary Artery Disease with Stent Placement Improved symptoms post-stent placement. No stent-related complications. On dual antiplatelet therapy. - Continue dual antiplatelet therapy  with blood thinner and low-dose aspirin. - Follow up with cardiologist in six months.  Type 2 Diabetes  Mellitus HbA1c increased to 7.5%. Emphasized lifestyle modifications.  Discussed potential metformin dosage increase if necessary. - Encourage lifestyle modifications to improve glycemic control. - Consider increasing metformin dosage if glycemic control does not improve.  Peripheral Neuropathy Numbness and stinging in legs and feet. Takes B vitamin supplement. Satisfactory circulation noted by podiatrist. - Continue B vitamin supplementation as needed. - Continue regular podiatrist visits for foot care.   Orders Placed This Encounter  Procedures   POCT HgB A1C    No orders of the defined types were placed in this encounter.   Follow up plan: Return for 6 month fasting lab > 1 week later Annual Physical.  Future labs ordered for 03/02/24   Saralyn Pilar, DO [facility] 08/28/2023, 11:32 AM

## 2023-10-31 ENCOUNTER — Ambulatory Visit: Admitting: Podiatry

## 2023-10-31 ENCOUNTER — Encounter: Payer: Self-pay | Admitting: Podiatry

## 2023-10-31 VITALS — Ht 70.0 in | Wt 166.0 lb

## 2023-10-31 DIAGNOSIS — E1142 Type 2 diabetes mellitus with diabetic polyneuropathy: Secondary | ICD-10-CM

## 2023-10-31 DIAGNOSIS — M79675 Pain in left toe(s): Secondary | ICD-10-CM | POA: Diagnosis not present

## 2023-10-31 DIAGNOSIS — B351 Tinea unguium: Secondary | ICD-10-CM

## 2023-10-31 DIAGNOSIS — M79674 Pain in right toe(s): Secondary | ICD-10-CM

## 2023-11-08 ENCOUNTER — Encounter: Payer: Self-pay | Admitting: Podiatry

## 2023-11-08 NOTE — Progress Notes (Signed)
  Subjective:  Patient ID: [name], male    DOB: [birth year],  MRN: [id number]  EDVIN Ryan presents to clinic today for at risk foot care with history of diabetic neuropathy and painful mycotic toenails of both feet that are difficult to trim. Pain interferes with daily activities and wearing enclosed shoe gear comfortably.  Patient c/o continuous neuropathy pain which occurs mostly at night. Chief Complaint  Patient presents with   Nail Problem    Pt is here for [facility] last A1C was 7.4 PCP is Dr Romeo Co and LOV was 2 months ago.   PCP is Raina Bunting, DO.  No Known Allergies  Review of Systems: Negative except as noted in the HPI.  Objective: No changes noted in today's physical examination. There were no vitals filed for this visit. [name] is a pleasant 89 y.o. male WD, WN in NAD. AAO x 3.  Vascular Examination: Capillary refill time immediate b/l. Palpable pedal pulses. Pedal hair present b/l. No pain with calf compression b/l. Skin temperature gradient WNL b/l. No cyanosis or clubbing b/l. No ischemia or gangrene noted b/l. No edema noted b/l LE.  Neurological Examination: Sensation grossly intact b/l with 10 gram monofilament. Vibratory sensation intact b/l. Pt has subjective symptoms of neuropathy.  Dermatological Examination: Pedal skin with normal turgor, texture and tone b/l.  No open wounds. No interdigital macerations.   Toenails 1-5 b/l thick, discolored, elongated with subungual debris and pain on dorsal palpation.   No hyperkeratotic nor porokeratotic lesions present on today's visit.  Musculoskeletal Examination: Muscle strength 5/5 to all LE muscle groups of BLE. Palpable exostosis noted 1st met cuneiform joint of both feet. No pain, crepitus or joint limitation noted with ROM bilateral LE.  Radiographs: None  Last A1c:      Latest Ref Rng & Units 08/28/2023   10:54 AM 02/19/2023    8:30 AM  Hemoglobin A1C  Hemoglobin-A1c 4.0 -  5.6 % 7.5  7.3     Assessment/Plan: 1. Pain due to onychomycosis of toenails of both feet   2. Diabetic peripheral neuropathy associated with type 2 diabetes mellitus [facility])   Consent given for treatment. Patient examined. All patient's and/or POA's questions/concerns addressed on today's visit. Toenails 1-5 debrided in length and girth without incident. Continue foot and shoe inspections daily. Monitor blood glucose per PCP/Endocrinologist's recommendations. Continue soft, supportive shoe gear daily. Report any pedal injuries to medical professional.  -Discussed neuropathy symptoms. Advised patient/POA to purchase Nervive Pain Cream or Roll On. Apply to foot/feet before bedtime. -Patient/POA to call should there be question/concern in the interim.   Return in about 3 months (around 01/31/2024).  [name], DPM      [facility] LOCATION: [address], [place] [address]                   Office [phone]   [facility] LOCATION: [address] [place], [place] [address] Office [phone]

## 2023-11-17 ENCOUNTER — Other Ambulatory Visit: Payer: Self-pay | Admitting: Obstetrics and Gynecology

## 2023-11-17 DIAGNOSIS — K080 Exfoliation of teeth due to systemic causes: Secondary | ICD-10-CM | POA: Diagnosis not present

## 2023-11-18 ENCOUNTER — Telehealth: Payer: Self-pay

## 2023-11-18 MED ORDER — ROSUVASTATIN CALCIUM 40 MG PO TABS: 40.0000 mg | ORAL_TABLET | Freq: Every day | ORAL | 0 refills | Status: DC

## 2023-11-18 NOTE — Addendum Note (Signed)
 Addended by: ANTONETTE ANGELINE ORN on: 11/18/2023 09:49 AM   Modules accepted: Orders

## 2023-11-18 NOTE — Telephone Encounter (Signed)
 Received refill request from [place] for refill on Rosuvastatin . Okay to refill? Shows as prescribed by another provider in patients chart. Thanks!

## 2023-11-18 NOTE — Telephone Encounter (Signed)
Rosuvastatin refilled.

## 2023-11-27 ENCOUNTER — Ambulatory Visit (INDEPENDENT_AMBULATORY_CARE_PROVIDER_SITE_OTHER): Admitting: Internal Medicine

## 2023-11-27 ENCOUNTER — Encounter: Payer: Self-pay | Admitting: Internal Medicine

## 2023-11-27 ENCOUNTER — Ambulatory Visit: Payer: Self-pay | Admitting: Family Medicine

## 2023-11-27 VITALS — BP 120/72 | HR 65 | Ht 70.0 in | Wt 166.4 lb

## 2023-11-27 DIAGNOSIS — R3129 Other microscopic hematuria: Secondary | ICD-10-CM

## 2023-11-27 DIAGNOSIS — K5909 Other constipation: Secondary | ICD-10-CM | POA: Diagnosis not present

## 2023-11-27 DIAGNOSIS — M545 Low back pain, unspecified: Secondary | ICD-10-CM | POA: Diagnosis not present

## 2023-11-27 LAB — POCT URINE DIPSTICK
Bilirubin, UA: NEGATIVE
Glucose, UA: NEGATIVE mg/dL
Ketones, POC UA: NEGATIVE mg/dL
Leukocytes, UA: NEGATIVE
Nitrite, UA: NEGATIVE
POC PROTEIN,UA: NEGATIVE
Spec Grav, UA: 1.015 (ref 1.010–1.025)
Urobilinogen, UA: 0.2 U/dL
pH, UA: 6 (ref 5.0–8.0)

## 2023-11-27 MED ORDER — TIZANIDINE HCL 4 MG PO TABS: 2.0000 mg | ORAL_TABLET | Freq: Three times a day (TID) | ORAL | 0 refills | Status: DC | PRN

## 2023-11-27 NOTE — Patient Instructions (Signed)

## 2023-11-27 NOTE — Progress Notes (Signed)
 Subjective:    Patient ID: [name], male    DOB: June 07, 1933, 89 y.o.   MRN: [id number]  HPI  Discussed the use of AI scribe software for clinical note transcription with the patient, who gave verbal consent to proceed.  [name] is an 89 year old male presents with worsening back pain.  He has been experiencing worsening back pain for the past couple of weeks. The pain is diffuse across the entire back, causing stiffness, particularly when getting out of a chair or bed. There is no radiation down the legs, and the pain is less severe when lying down, although he wakes up stiff. He has been using heat and cold therapy, which provides temporary relief, and has taken Tylenol  500 mg occasionally, but not regularly.  He has a history of chronic constipation. He previously tried Miralax  without significant improvement and currently manages his symptoms with coffee in the morning, which helps him have a bowel movement, though he feels it is insufficient. No significant abdominal pain is associated with constipation.  No urinary symptoms such as burning or increased frequency, attributing frequent urination to high water intake. He has not identified any specific activity that might have caused the back pain.    Review of Systems   Past Medical History:  Diagnosis Date   CAD (coronary artery disease)    Coronary artery disease of native artery of native heart with stable angina pectoris (HCC)    Diabetes mellitus without complication (HCC)    Grade I diastolic dysfunction    Hyperlipidemia    Hypertension    Neuropathy    Neuropathy    feet   Prostate cancer (HCC)    STEMI (ST elevation myocardial infarction) (HCC) 04/2023   Wears dentures    partial upper and lower   Wears hearing aid in both ears     Current Outpatient Medications  Medication Sig Dispense Refill   aspirin  81 MG chewable tablet Chew 1 tablet (81 mg total) by mouth daily. 90 tablet 1   azelastine   (ASTELIN ) 0.1 % nasal spray Place 2 sprays into both nostrils 2 (two) times daily. Use in each nostril as directed (Patient taking differently: Place 2 sprays into both nostrils 2 (two) times daily. Use in each nostril as directed PRN) 30 mL 12   clopidogrel (PLAVIX) 75 MG tablet Take 75 mg by mouth at bedtime.     colchicine  0.6 MG tablet If acute flare only - Day 1: take 2 pills first day, then repeat 1 pill within two hours. Day 2 - take 1 pill daily for up to max 7 days or stop when gout flare resolves. 20 tablet 2   diclofenac  Sodium (VOLTAREN ) 1 % GEL Apply 2 g topically 4 (four) times daily as needed (elbow bursitis). 100 g 2   ezetimibe  (ZETIA ) 10 MG tablet Take 1 tablet (10 mg total) by mouth daily. 90 tablet 3   fluticasone  (FLONASE ) 50 MCG/ACT nasal spray Place 2 sprays into both nostrils daily. Use for 4-6 weeks then stop and use seasonally or as needed. 16 g 3   metFORMIN  (GLUCOPHAGE -XR) 500 MG 24 hr tablet Take 1 tablet (500 mg total) by mouth daily with breakfast. 90 tablet 3   metoprolol  succinate (TOPROL -XL) 25 MG 24 hr tablet Take 1 tablet (25 mg total) by mouth daily. Take with or immediately following a meal. 60 tablet 1   montelukast  (SINGULAIR ) 10 MG tablet Take 1 tablet (10 mg total) by mouth  at bedtime. 90 tablet 3   polyethylene glycol powder (GLYCOLAX /MIRALAX ) 17 GM/SCOOP powder Take 17-34 g by mouth daily as needed for moderate constipation or mild constipation. 238 g 1   pregabalin  (LYRICA ) 75 MG capsule Take 1 capsule (75 mg total) by mouth 2 (two) times daily. 180 capsule 1   rosuvastatin  (CRESTOR ) 40 MG tablet Take 1 tablet (40 mg total) by mouth daily. 90 tablet 0   ticagrelor  (BRILINTA ) 90 MG TABS tablet Take 1 tablet (90 mg total) by mouth 2 (two) times daily. 180 tablet 1   No current facility-administered medications for this visit.    No Known Allergies  Family History  Problem Relation Age of Onset   Stroke Mother    Cancer Mother        colon cancer    COPD Father    COPD Brother    Hearing loss Son     Social History   Socioeconomic History   Marital status: Widowed    Spouse name: Not on file   Number of children: Not on file   Years of education: Not on file   Highest education level: High school graduate  Occupational History   Not on file  Tobacco Use   Smoking status: Former    Current packs/day: 0.00    Types: Cigarettes    Quit date: 05/27/1978    Years since quitting: 45.5   Smokeless tobacco: Never  Vaping Use   Vaping status: Never Used  Substance and Sexual Activity   Alcohol use: No    Alcohol/week: 0.0 standard drinks of alcohol   Drug use: No   Sexual activity: Not on file  Other Topics Concern   Not on file  Social History Narrative   Lives in retirement community    Social Drivers of Health   Financial Resource Strain: Low Risk  (05/09/2023)   Overall Financial Resource Strain (CARDIA)    Difficulty of Paying Living Expenses: Not hard at all  Food Insecurity: No Food Insecurity (05/13/2023)   Hunger Vital Sign    Worried About Running Out of Food in the Last Year: Never true    Ran Out of Food in the Last Year: Never true  Transportation Needs: No Transportation Needs (05/13/2023)   PRAPARE - Administrator, Civil Service (Medical): No    Lack of Transportation (Non-Medical): No  Physical Activity: Insufficiently Active (05/09/2023)   Exercise Vital Sign    Days of Exercise per Week: 2 days    Minutes of Exercise per Session: 10 min  Stress: No Stress Concern Present (05/09/2023)   [employer] of Occupational Health - Occupational Stress Questionnaire    Feeling of Stress : Not at all  Social Connections: Socially Integrated (05/09/2023)   Social Connection and Isolation Panel    Frequency of Communication with Friends and Family: More than three times a week    Frequency of Social Gatherings with Friends and Family: Twice a week    Attends Religious Services: More than 4  times per year    Active Member of [employer] or Organizations: Yes    Attends Banker Meetings: More than 4 times per year    Marital Status: Married  Catering manager Violence: Not At Risk (05/13/2023)   Humiliation, Afraid, Rape, and Kick questionnaire    Fear of Current or Ex-Partner: No    Emotionally Abused: No    Physically Abused: No    Sexually Abused: No     Constitutional:  Denies fever, malaise, fatigue, headache or abrupt weight changes.  Respiratory: Denies difficulty breathing, shortness of breath, cough or sputum production.   Cardiovascular: Denies chest pain, chest tightness, palpitations or swelling in the hands or feet.  Gastrointestinal: Patient reports chronic constipation.  Denies abdominal pain, bloating, diarrhea or blood in the stool.  GU: Patient reports urinary frequency.  Denies urgency, pain with urination, burning sensation, blood in urine, odor or discharge. Musculoskeletal: Patient reports low back pain.  Denies decrease in range of motion, difficulty with gait, muscle pain or joint swelling.  Skin: Denies redness, rashes, lesions or ulcercations.  Neurological: Denies numbness, tingling, weakness of the lower extremities or problems with balance and coordination.    No other specific complaints in a complete review of systems (except as listed in HPI above).      Objective:   Physical Exam  BP 120/72 (BP Location: Left Arm, Patient Position: Sitting, Cuff Size: Normal)   Pulse 65   Ht 5' 10 (1.778 m)   Wt 166 lb 6.4 oz (75.5 kg)   SpO2 98%   BMI 23.88 kg/m   Wt Readings from Last 3 Encounters:  10/31/23 166 lb (75.3 kg)  08/28/23 166 lb (75.3 kg)  07/31/23 162 lb (73.5 kg)    General: Appears his stated age, in NAD. Cardiovascular: Normal rate and rhythm.  Pulmonary/Chest: Normal effort and positive vesicular breath sounds. No respiratory distress. No wheezes, rales or ronchi noted.  Abdomen: Soft and nontender.  Hypoactive  bowel sounds. No distention or masses noted.  + CVA tenderness noted on the right Musculoskeletal: Normal flexion, extension and lateral bending of the spine.  No difficulty with gait.  Neurological: Alert and oriented. Cranial nerves II-XII grossly intact. Coordination normal.  Psychiatric: Mood and affect normal. Behavior is normal. Judgment and thought content normal.    BMET    Component Value Date/Time   NA 133 (L) 05/12/2023 0454   NA 140 09/04/2015 0824   K 3.9 05/12/2023 0454   CL 100 05/12/2023 0454   CO2 27 05/12/2023 0454   GLUCOSE 158 (H) 05/12/2023 0454   BUN 26 (H) 05/12/2023 0454   BUN 25 09/04/2015 0824   CREATININE 0.89 05/12/2023 0454   CREATININE 1.29 (H) 02/19/2023 0830   CALCIUM  9.0 05/12/2023 0454   GFRNONAA >60 05/12/2023 0454   GFRNONAA 57 (L) 12/31/2019 0922   GFRAA 66 12/31/2019 0922    Lipid Panel     Component Value Date/Time   CHOL 217 (H) 02/19/2023 0830   CHOL 182 09/04/2015 0824   TRIG 337 (H) 02/19/2023 0830   HDL 36 (L) 02/19/2023 0830   HDL 32 (L) 09/04/2015 0824   CHOLHDL 6.0 (H) 02/19/2023 0830   VLDL 51 (H) 06/25/2016 0001   LDLCALC 133 (H) 02/19/2023 0830    CBC    Component Value Date/Time   WBC 11.1 (H) 05/12/2023 0454   RBC 5.41 05/12/2023 0454   HGB 16.2 05/12/2023 0454   HGB 16.8 09/04/2015 0824   HCT 46.3 05/12/2023 0454   HCT 47.6 09/04/2015 0824   PLT 161 05/12/2023 0454   PLT 144 (L) 09/04/2015 0824   MCV 85.6 05/12/2023 0454   MCV 85 09/04/2015 0824   MCH 29.9 05/12/2023 0454   MCHC 35.0 05/12/2023 0454   RDW 13.3 05/12/2023 0454   RDW 13.6 09/04/2015 0824   LYMPHSABS 3,040 02/19/2023 0830   LYMPHSABS 2.2 09/04/2015 0824   MONOABS 580 06/25/2016 0001   EOSABS 109 02/19/2023 0830  EOSABS 0.1 09/04/2015 0824   BASOSABS 61 02/19/2023 0830   BASOSABS 0.0 09/04/2015 0824    Hgb A1C Lab Results  Component Value Date   HGBA1C 7.5 (A) 08/28/2023            Assessment & Plan:    Assessment and  Plan    Acute Bilateral Low Back Pain Chronic back pain likely muscular in origin. Hematuria due to anticoagulants, no UTI. Discussed Zanaflex use, fall risk, and driving precautions. -Urinalysis shows trace blood, will send urine culture - Prescribed Zanaflex 4 mg, half tablet every eight hours as needed.  Sedation caution given - Advised against driving while on muscle relaxants. - Recommended alternating heat and cold therapy twice daily for ten minutes each. - Instructed to take Acetaminophen  500 mg every eight hours as needed. - Advised to contact clinic if no improvement by Monday.  Constipation Chronic constipation, partial relief with coffee. Constipation not primary cause of back pain. - Restart Miralax  in the morning, possibly with coffee. - Prescribe Docusate 100 mg at bedtime.       Follow-up with your PCP as previously scheduled Angeline Laura, NP

## 2023-11-27 NOTE — Telephone Encounter (Signed)
 FYI Only or Action Required?: FYI only for provider.  Patient was last seen in primary care on 08/28/2023 by Edman Marsa PARAS, DO. Called Nurse Triage reporting Back Pain. Symptoms began a week ago. Interventions attempted: Ice/heat application. Symptoms are: gradually worsening.  Triage Disposition: See HCP Within 4 Hours (Or PCP Triage)  Patient/caregiver understands and will follow disposition?: Yes                    Copied from CRM [id number]. Topic: Clinical - Red Word Triage >> Nov 27, 2023  8:41 AM Turkey B wrote: [facility] that prompted transfer to Nurse Triage: pt has severe pain in back Reason for Disposition  [1] SEVERE back pain (e.g., excruciating, unable to do any normal activities) AND [2] not improved 2 hours after pain medicine  Answer Assessment - Initial Assessment Questions 1. ONSET: When did the pain begin?      One week 2. LOCATION: Where does it hurt? (upper, mid or lower back)     Lower back 3. SEVERITY: How bad is the pain?  (e.g., Scale 1-10; mild, moderate, or severe)   - MILD (1-3): Doesn't interfere with normal activities.    - MODERATE (4-7): Interferes with normal activities or awakens from sleep.    - SEVERE (8-10): Excruciating pain, unable to do any normal activities.      At times a 10/10 pain level with movement 4. PATTERN: Is the pain constant? (e.g., yes, no; constant, intermittent)      No with movement 5. RADIATION: Does the pain shoot into your legs or somewhere else?     Not really 6. CAUSE:  What do you think is causing the back pain?      Pt's wife told him it could be his kidneys 7. BACK OVERUSE:  Any recent lifting of heavy objects, strenuous work or exercise?     No 8. MEDICINES: What have you taken so far for the pain? (e.g., nothing, acetaminophen , NSAIDS)     Tylenol  last night it didn't help 9. NEUROLOGIC SYMPTOMS: Do you have any weakness, numbness, or problems with bowel/bladder control?      Denies burning with urination; pt states he has had issues with constipation, just goes a little bit in morning 10. OTHER SYMPTOMS: Do you have any other symptoms? (e.g., fever, abdomen pain, burning with urination, blood in urine)       Denies fever, stomach pain  Protocols used: Back Pain-A-AH

## 2023-11-27 NOTE — Telephone Encounter (Signed)
 Will discuss at upcoming appointment

## 2023-11-28 LAB — URINE CULTURE
MICRO NUMBER:: 16659217
Result:: NO GROWTH
SPECIMEN QUALITY:: ADEQUATE

## 2023-11-29 ENCOUNTER — Ambulatory Visit: Payer: Self-pay | Admitting: Internal Medicine

## 2023-12-04 DIAGNOSIS — K080 Exfoliation of teeth due to systemic causes: Secondary | ICD-10-CM | POA: Diagnosis not present

## 2023-12-11 DIAGNOSIS — K080 Exfoliation of teeth due to systemic causes: Secondary | ICD-10-CM | POA: Diagnosis not present

## 2023-12-16 ENCOUNTER — Other Ambulatory Visit: Payer: Self-pay | Admitting: Family Medicine

## 2023-12-16 DIAGNOSIS — E1142 Type 2 diabetes mellitus with diabetic polyneuropathy: Secondary | ICD-10-CM

## 2023-12-16 DIAGNOSIS — G63 Polyneuropathy in diseases classified elsewhere: Secondary | ICD-10-CM

## 2023-12-18 NOTE — Telephone Encounter (Signed)
 Requested medication (s) are due for refill today - yes  Requested medication (s) are on the active medication list -yes  Future visit scheduled -yes  Last refill: 06/10/23 #180 1RF  Notes to clinic: non delegated Rx  Requested Prescriptions  Pending Prescriptions Disp Refills   pregabalin  (LYRICA ) 75 MG capsule [Pharmacy Med Name: PREGABALIN  75 MG CAPSULE] 180 capsule 0    Sig: Take 1 capsule (75 mg total) by mouth 2 (two) times daily.     Not Delegated - Neurology:  Anticonvulsants - Controlled - pregabalin  Failed - 12/18/2023  1:13 PM      Failed - This refill cannot be delegated      Passed - Cr in normal range and within 360 days    Creat  Date Value Ref Range Status  02/19/2023 1.29 (H) 0.70 - 1.22 mg/dL Final   Creatinine, Ser  Date Value Ref Range Status  05/12/2023 0.89 0.61 - 1.24 mg/dL Final   Creatinine, Urine  Date Value Ref Range Status  02/27/2022 74 20 - 320 mg/dL Final         Passed - Completed PHQ-2 or PHQ-9 in the last 360 days      Passed - Valid encounter within last 12 months    Recent Outpatient Visits           3 weeks ago Acute bilateral low back pain without sciatica   [facility] [place], [place] W, NP   3 months ago Type 2 diabetes mellitus with diabetic polyneuropathy, without long-term current use of insulin [facility])   [facility] [place], Marsa PARAS, DO                 Requested Prescriptions  Pending Prescriptions Disp Refills   pregabalin  (LYRICA ) 75 MG capsule [Pharmacy Med Name: PREGABALIN  75 MG CAPSULE] 180 capsule 0    Sig: Take 1 capsule (75 mg total) by mouth 2 (two) times daily.     Not Delegated - Neurology:  Anticonvulsants - Controlled - pregabalin  Failed - 12/18/2023  1:13 PM      Failed - This refill cannot be delegated      Passed - Cr in normal range and within 360 days    Creat  Date Value Ref Range Status  02/19/2023 1.29 (H) 0.70 - 1.22 mg/dL Final    Creatinine, Ser  Date Value Ref Range Status  05/12/2023 0.89 0.61 - 1.24 mg/dL Final   Creatinine, Urine  Date Value Ref Range Status  02/27/2022 74 20 - 320 mg/dL Final         Passed - Completed PHQ-2 or PHQ-9 in the last 360 days      Passed - Valid encounter within last 12 months    Recent Outpatient Visits           3 weeks ago Acute bilateral low back pain without sciatica   [place] [place], [place] W, NP   3 months ago Type 2 diabetes mellitus with diabetic polyneuropathy, without long-term current use of insulin [facility])    [facility] [place], Marsa PARAS, [place]

## 2024-01-09 ENCOUNTER — Telehealth: Payer: Self-pay

## 2024-01-09 ENCOUNTER — Ambulatory Visit (INDEPENDENT_AMBULATORY_CARE_PROVIDER_SITE_OTHER)

## 2024-01-09 ENCOUNTER — Ambulatory Visit: Payer: Self-pay | Admitting: Family Medicine

## 2024-01-09 VITALS — BP 138/60 | HR 35 | Ht 70.0 in | Wt 165.2 lb

## 2024-01-09 DIAGNOSIS — R058 Other specified cough: Secondary | ICD-10-CM

## 2024-01-09 DIAGNOSIS — R001 Bradycardia, unspecified: Secondary | ICD-10-CM | POA: Diagnosis not present

## 2024-01-09 DIAGNOSIS — R5383 Other fatigue: Secondary | ICD-10-CM | POA: Insufficient documentation

## 2024-01-09 DIAGNOSIS — R059 Cough, unspecified: Secondary | ICD-10-CM | POA: Insufficient documentation

## 2024-01-09 MED ORDER — IPRATROPIUM BROMIDE 0.06 % NA SOLN: 2.0000 | Freq: Four times a day (QID) | NASAL | 0 refills | Status: AC

## 2024-01-09 NOTE — Telephone Encounter (Signed)
 Left message for patient, appointment with scheduled for Monday 8/18 with dr. Edman. Also cardiology appointment scheduled for 9/4 at 11:30

## 2024-01-09 NOTE — Progress Notes (Signed)
 I have called cardiology. He is scheduled on 9/18. However they are going to see him on 9/4. The receptionist stated that is the soonest appointment.

## 2024-01-09 NOTE — Telephone Encounter (Signed)
 FYI Only or Action Required?: FYI only for provider.  Patient was last seen in primary care on 11/27/2023 by Antonette Angeline ORN, NP.  Called Nurse Triage reporting Cough.  Symptoms began several weeks ago.  Symptoms are: gradually improving.  Triage Disposition: See Physician Within 24 Hours  Patient/caregiver understands and will follow disposition?: Yes                  Copied from CRM #[id number]. Topic: Clinical - Red Word Triage >> Jan 09, 2024  8:48 AM Rea ORN wrote: Red Word that prompted transfer to Nurse Triage: Pt has productive cough and chest congestion for the past 2 weeks Reason for Disposition  SEVERE coughing spells (e.g., whooping sound after coughing, vomiting after coughing)  Answer Assessment - Initial Assessment Questions ONSET: When did the cough begin?      A couple of weeks ago  SEVERITY: How bad is the cough today?      Got a little better in past couple of days  SPUTUM: Describe the color of your sputum (e.g., none, dry cough; clear, white, yellow, green)     Clear sputum  HEMOPTYSIS: Are you coughing up any blood? If Yes, ask: How much? (e.g., flecks, streaks, tablespoons, etc.)     No  DIFFICULTY BREATHING: Are you having difficulty breathing? If Yes, ask: How bad is it? (e.g., mild, moderate, severe)      No  FEVER: Do you have a fever? If Yes, ask: What is your temperature, how was it measured, and when did it start?     No  OTHER SYMPTOMS: Do you have any other symptoms? (e.g., runny nose, wheezing, chest pain)       Congestion   Denies chest pain, difficulty breating  Protocols used: Cough - Acute Productive-A-AH

## 2024-01-09 NOTE — Progress Notes (Signed)
 I have spoke with receptionist at Cardiology, asked if doctor on call could review office visit from here today and triage given that his pulse rate was 35.  Message was sent to doctor on call for cardiology and they will contact the patient for further instruction.

## 2024-01-09 NOTE — Addendum Note (Signed)
 Addended by: Sixto Bowdish A on: 01/09/2024 12:08 PM   Modules accepted: Orders

## 2024-01-09 NOTE — Progress Notes (Signed)
 Acute Patient Visit  Physician: Jahyra Sukup A Dyquan Minks, MD  Patient: [name] MRN: [id number] DOB: Dec 22, 1933 PCP: Edman Marsa PARAS, DO     Subjective:   Chief Complaint  Patient presents with   Cough    Chest congestion for 2 weeks accompanied with phlegm.       HPI: The patient is a 89 y.o. male who presents today for:   Discussed the use of AI scribe software for clinical note transcription with the patient, who gave verbal consent to proceed.  History of Present Illness   [name] is an 89 year old male with type 2 diabetes and a history of myocardial infarction who presents with persistent cough and congestion.  Cough and upper respiratory symptoms - Persistent productive cough and chest congestion for two weeks - No sputum production today - Runny nose every morning, likely due to postnasal drip - No fever or chills - Delayed seeking medical attention, hoping symptoms would resolve  Fatigue - Significant fatigue since myocardial infarction and stent placement in December - Fatigue persists since recent COVID-19 exposure during a cruise to [place]  HR in office 38 BPM, 32 by manual count.   Easy bruising - Easy bruising present - Recent large bruise from a minor bump  Type 2 diabetes mellitus - Type 2 diabetes is well-controlled - Recent satisfactory A1c  Recent infectious exposure - Recent exposure to COVID-19 during a cruise to [place]   Laboratory monitoring - No recent blood work since early summer        ROS:   As noted in the HPI    ASSESMENT/PLAN:  Encounter Diagnoses  Name Primary?   Bradycardia Yes   Other cough    Other fatigue     No orders of the defined types were placed in this encounter.   Assessment and Plan    Bradycardia Heart rate is significantly low at 38 beats per minute - 33 on manual count contributing to fatigue and general malaise. L Requires more urgent cardiology evaluation.   - Perform EKG  today - Refer to cardiology for more urgent evaluation - Instruct to take metoprolol  every other day for a week, then stop until cardiology evaluation.  Monitor closely for new symptoms  Myocardial infarction with coronary stent placement Myocardial infarction in December with stent placement. Persistent fatigue and malaise since the procedure, potentially related to bradycardia and medication side effects.  Sick sinus as ddx    Fatigue Chronic fatigue likely multifactorial, related to bradycardia, medication side effects (on pregabalin ), and recent COVID-19 exposure. Requires further evaluation by cardiology.   Easy bruising Reports easy bruising with minor trauma. - Discuss with primary care physician for further evaluation and blood work  Cough and upper respiratory congestion Cough and congestion present for two weeks, likely due to postnasal drip. No fever or chills. Lungs sound clear on examination. Symptoms may be exacerbated by nasal drainage.  No signs of acute infection - Prescribe nasal spray to reduce postnasal drip  Type 2 diabetes mellitus Type 2 diabetes mellitus, previously well-controlled with good A1c levels. No current concerns raised about blood sugar levels.     Patient is unknown to me and I am not his primary care provider it is impossible to adequately assess his overall condition and the time available.  He needs more ongoing and consistent follow-up with his PCP she is emergency       OBJECTIVE: Vitals:   01/09/24 0950  BP: 138/60  Pulse: ROLLEN)  35  SpO2: 97%  Weight: 165 lb 4 oz (75 kg)  Height: 5' 10 (1.778 m)    Body mass index is 23.71 kg/m.   Physical Exam Vitals reviewed.  Constitutional:      Appearance: Normal appearance. Well-developed with normal weight.  Cardiovascular:     Rate and Rhythm: Normal rate and regular rhythm. Normal heart sounds. Normal peripheral pulses Pulmonary:     Normal breath sounds with normal effort Skin:     General: Skin is warm and dry without noticeable rash. Neurological:     General: No focal deficit present.  Psychiatric:        Mood and Affect: Mood, behavior and cognition normal       Allergies Patient has no known allergies.  Past Medical History Patient  has a past medical history of CAD (coronary artery disease), Coronary artery disease of native artery of native heart with stable angina pectoris (HCC), Diabetes mellitus without complication (HCC), Grade I diastolic dysfunction, Hyperlipidemia, Hypertension, Neuropathy, Neuropathy, Prostate cancer (HCC), STEMI (ST elevation myocardial infarction) (HCC) (04/2023), Wears dentures, and Wears hearing aid in both ears.  Surgical History Patient  has a past surgical history that includes Colonoscopy; Cataract extraction w/PHACO (Left, 04/24/2015); Coronary/Graft Acute MI Revascularization (N/A, 05/10/2023); LEFT HEART CATH AND CORONARY ANGIOGRAPHY (N/A, 05/10/2023); and Cataract extraction w/PHACO (Right, 07/29/2023).  Family History Pateint's family history includes COPD in his brother and father; Cancer in his mother; Hearing loss in his son; Stroke in his mother.  Social History Patient  reports that he quit smoking about 45 years ago. His smoking use included cigarettes. He has never used smokeless tobacco. He reports that he does not drink alcohol and does not use drugs.    01/09/2024

## 2024-01-12 ENCOUNTER — Ambulatory Visit (INDEPENDENT_AMBULATORY_CARE_PROVIDER_SITE_OTHER): Admitting: Family Medicine

## 2024-01-12 ENCOUNTER — Encounter: Payer: Self-pay | Admitting: Family Medicine

## 2024-01-12 VITALS — BP 144/60 | HR 71 | Temp 98.0°F | Resp 18 | Ht 70.0 in | Wt 165.0 lb

## 2024-01-12 DIAGNOSIS — R001 Bradycardia, unspecified: Secondary | ICD-10-CM

## 2024-01-12 DIAGNOSIS — I129 Hypertensive chronic kidney disease with stage 1 through stage 4 chronic kidney disease, or unspecified chronic kidney disease: Secondary | ICD-10-CM

## 2024-01-12 DIAGNOSIS — J0110 Acute frontal sinusitis, unspecified: Secondary | ICD-10-CM

## 2024-01-12 DIAGNOSIS — R5383 Other fatigue: Secondary | ICD-10-CM

## 2024-01-12 DIAGNOSIS — I25118 Atherosclerotic heart disease of native coronary artery with other forms of angina pectoris: Secondary | ICD-10-CM

## 2024-01-12 DIAGNOSIS — R2689 Other abnormalities of gait and mobility: Secondary | ICD-10-CM

## 2024-01-12 DIAGNOSIS — N183 Chronic kidney disease, stage 3 unspecified: Secondary | ICD-10-CM

## 2024-01-12 DIAGNOSIS — Z7984 Long term (current) use of oral hypoglycemic drugs: Secondary | ICD-10-CM

## 2024-01-12 DIAGNOSIS — E1142 Type 2 diabetes mellitus with diabetic polyneuropathy: Secondary | ICD-10-CM

## 2024-01-12 MED ORDER — AMOXICILLIN-POT CLAVULANATE 875-125 MG PO TABS: 1.0000 | ORAL_TABLET | Freq: Two times a day (BID) | ORAL | 0 refills | Status: DC

## 2024-01-12 NOTE — Patient Instructions (Addendum)
 Thank you for coming to the office today.  Referral to Neurologist  [facility] - Neurology Dept [address] [place], [place] [address] Phone: [phone]  Okay to keep alternating Metoprolol  XL 25 for 1 week then discontinue 1 week before your Cardiologist on 01/29/24  BP is only mild elevated at this time, we will monitor going forward  Cardiology may do further testing, or switch medications.  If not improving and sinuses worsen to infection, okay to take the printed augmentin  antibiotic to pharmacy. Otherwise if not having these symptoms or you improve, you can skip it and not take the antibiotic.  Seek care at hospital ED or Urgent care if worsening problem with headache, lightheadedness, dizziness, chest pain shortness of breath, pass out.  Please schedule a Follow-up Appointment to: Return if symptoms worsen or fail to improve.  If you have any other questions or concerns, please feel free to call the office or send a message through MyChart. You may also schedule an earlier appointment if necessary.  Additionally, you may be receiving a survey about your experience at our office within a few days to 1 week by e-mail or mail. We value your feedback.  Marsa Officer, DO [facility], [place]

## 2024-01-12 NOTE — Progress Notes (Signed)
 Subjective:    Patient ID: [name], male    DOB: Aug 17, 1933, 89 y.o.   MRN: [id number]  [name] is a 89 y.o. male presenting on 01/12/2024 for Medical Management of Chronic Issues, Bradycardia, and Diabetes   HPI  Discussed the use of AI scribe software for clinical note transcription with the patient, who gave verbal consent to proceed.  History of Present Illness   [name] is an 89 year old male who presents for follow-up on medication management and sinus symptoms.  Bradycardia and antihypertensive medication management - Previously taking metoprolol  25 mg daily; currently instructed to take every other day for one week before discontinuation due to low heart rate (previously recorded in the 30s) - Did not take metoprolol  today as [name] has not eaten yet - Does not monitor blood pressure at home  Sinus congestion and associated symptoms - Persistent sinus congestion, most pronounced in the mornings and improves by midday - Congestion causes dizziness - Uses nasal decongestant spray, which has reduced cough and phlegm production - Occasional chest shortness of breath attributed to congestion  Easy bruising - On blood thinners and baby aspirin  - Bruises easily, with a recent bruise after bumping into a mirror  Peripheral neuropathy - Neuropathy in feet with worsening numbness and balance issues - Uses topical cream and has tried [name] without significant relief - Previously evaluated by podiatrist and recommended various treatments, but continues to have significant symptoms - Tried gabapentin  and Lyrica  in the past without much success - Takes beet tablets for circulation          01/09/2024    9:56 AM 11/27/2023   11:33 AM 08/28/2023   10:48 AM  Depression screen PHQ 2/9  Decreased Interest 1 1 0  Down, Depressed, Hopeless 1 0 0  PHQ - 2 Score 2 1 0  Altered sleeping 0 0 0  Tired, decreased energy 1 1 0  Change in appetite 0 0 0  Feeling bad or  failure about yourself  0 0 0  Trouble concentrating 0 0 0  Moving slowly or fidgety/restless 0 0 0  Suicidal thoughts 0 0 0  PHQ-9 Score 3 2 0  Difficult doing work/chores Somewhat difficult Not difficult at all        01/09/2024    9:56 AM 11/27/2023   11:33 AM 08/28/2023   10:49 AM 05/06/2023    8:35 AM  GAD 7 : Generalized Anxiety Score  Nervous, Anxious, on Edge 0 0 0 1  Control/stop worrying 0 0 0 1  Worry too much - different things 0 0 0 1  Trouble relaxing 0 0 0 0  Restless 0 0 0 0  Easily annoyed or irritable 0 0 0 1  Afraid - awful might happen 0 0 0 0  Total GAD 7 Score 0 0 0 4  Anxiety Difficulty Not difficult at all Not difficult at all      Social History   Tobacco Use   Smoking status: Former    Current packs/day: 0.00    Types: Cigarettes    Quit date: 05/27/1978    Years since quitting: 45.6   Smokeless tobacco: Never  Vaping Use   Vaping status: Never Used  Substance Use Topics   Alcohol use: No    Alcohol/week: 0.0 standard drinks of alcohol   Drug use: No    Review of Systems Per HPI unless specifically indicated above     Objective:  BP (!) 144/60 (BP Location: Left Arm, Cuff Size: Normal)   Pulse 71   Ht 5' 10 (1.778 m)   Wt 165 lb (74.8 kg)   SpO2 96%   BMI 23.68 kg/m   Wt Readings from Last 3 Encounters:  01/12/24 165 lb (74.8 kg)  01/09/24 165 lb 4 oz (75 kg)  11/27/23 166 lb 6.4 oz (75.5 kg)    Physical Exam Vitals and nursing note reviewed.  Constitutional:      General: [name] is not in acute distress.    Appearance: [name] is well-developed. [name] is not diaphoretic.     Comments: Well-appearing, comfortable, cooperative  HENT:     Head: Normocephalic and atraumatic.  Eyes:     General:        Right eye: No discharge.        Left eye: No discharge.     Conjunctiva/sclera: Conjunctivae normal.  Neck:     Thyroid : No thyromegaly.  Cardiovascular:     Rate and Rhythm: Normal rate and regular rhythm.     Pulses: Normal pulses.      Heart sounds: Normal heart sounds. No murmur heard. Pulmonary:     Effort: Pulmonary effort is normal. No respiratory distress.     Breath sounds: Normal breath sounds. No wheezing or rales.  Musculoskeletal:        General: Normal range of motion.     Cervical back: Normal range of motion and neck supple.  Lymphadenopathy:     Cervical: No cervical adenopathy.  Skin:    General: Skin is warm and dry.     Findings: No erythema or rash.  Neurological:     Mental Status: [name] is alert and oriented to person, place, and time. Mental status is at baseline.  Psychiatric:        Behavior: Behavior normal.     Comments: Well groomed, good eye contact, normal speech and thoughts     Results for orders placed or performed in visit on 11/27/23  POCT URINE DIPSTICK   Collection Time: 11/27/23 11:44 AM  Result Value Ref Range   Color, UA yellow yellow   Clarity, UA clear clear   Glucose, UA negative negative mg/dL   Bilirubin, UA negative negative   Ketones, POC UA negative negative mg/dL   Spec Grav, UA 8.984 8.989 - 1.025   Blood, UA trace-intact (A) negative   pH, UA 6.0 5.0 - 8.0   POC PROTEIN,UA negative negative, trace   Urobilinogen, UA 0.2 0.2 or 1.0 E.U./dL   Nitrite, UA Negative Negative   Leukocytes, UA Negative Negative  Urine Culture   Collection Time: 11/27/23  1:17 PM   Specimen: Urine  Result Value Ref Range   MICRO NUMBER: [id number]    SPECIMEN QUALITY: Adequate    Sample Source URINE    STATUS: FINAL    Result: No Growth       Assessment & Plan:   Problem List Items Addressed This Visit     Benign hypertension with CKD (chronic kidney disease) stage III (HCC)   Bradycardia - Primary   Fatigue   Type 2 diabetes mellitus with diabetic polyneuropathy (HCC)   Relevant Orders   Ambulatory referral to Neurology   Other Visit Diagnoses       Acute non-recurrent frontal sinusitis       Relevant Medications   amoxicillin -clavulanate (AUGMENTIN ) 875-125 MG  tablet     Coronary artery disease of native artery of native heart with stable angina  pectoris (HCC)         Long term current use of oral hypoglycemic drug         Poor balance            Bradycardia in the setting of antihypertensive therapy adjustment [name] has chronic bradycardia history avg HR 50-70s Recently with some reduced HR readings here in office last week 8/15 with HR 30s Suspected to be related to beta blocker Metoprolol  XL 25mg  Discussion today that [name] is doing better on intermittent dosing and has only really skipped 2 doses so far [name] will continue alternating dose this week then discontinue Soonest available Cardiology apt with [facility] to work him in is on 01/29/24. [name] can remain off Metoprolol  and discuss with them at that time Caution BP may elevate some off of beta blocker  Hypertension See above Blood pressure mildly elevated at 150/71 mmHg. Discussed potential increase in blood pressure with metoprolol  discontinuation. Repeat BP improved - Taper off Metoprolol  XL - Monitor blood pressure. - Follow up with cardiologist on September 4th.  Coronary artery disease, status post stent placement Ongoing management with blood thinners and aspirin . Bruising noted as side effect of anticoagulation. - Continue current anticoagulation therapy. - Discuss bruising and medication regimen with cardiologist on September 4th.  Peripheral neuropathy of the feet Significant numbness affecting balance and mobility. Previous treatments with limited success. - Refer to neurologist for further evaluation and management.  Chronic sinus congestion Improvement with decongestant nasal spray. Symptoms include morning congestion and occasional dizziness. - Continue using decongestant nasal spray. - Provide prescription for Augmentin  as backup if symptoms worsen.        Orders Placed This Encounter  Procedures   Ambulatory referral to Neurology    Referral Priority:   Routine     Referral Type:   Consultation    Referral Reason:   Specialty Services Required    Requested Specialty:   Neurology    Number of Visits Requested:   1    Meds ordered this encounter  Medications   amoxicillin -clavulanate (AUGMENTIN ) 875-125 MG tablet    Sig: Take 1 tablet by mouth 2 (two) times daily.    Dispense:  20 tablet    Refill:  0    Follow up plan: Return if symptoms worsen or fail to improve.   Marsa Officer, DO [facility] 01/12/2024, 8:16 AM

## 2024-01-23 ENCOUNTER — Ambulatory Visit: Admitting: Family Medicine

## 2024-01-29 ENCOUNTER — Ambulatory Visit: Payer: Self-pay

## 2024-01-29 DIAGNOSIS — I498 Other specified cardiac arrhythmias: Secondary | ICD-10-CM | POA: Diagnosis not present

## 2024-01-29 DIAGNOSIS — I493 Ventricular premature depolarization: Secondary | ICD-10-CM | POA: Diagnosis not present

## 2024-01-29 DIAGNOSIS — E782 Mixed hyperlipidemia: Secondary | ICD-10-CM | POA: Diagnosis not present

## 2024-01-29 DIAGNOSIS — I2510 Atherosclerotic heart disease of native coronary artery without angina pectoris: Secondary | ICD-10-CM | POA: Diagnosis not present

## 2024-01-29 DIAGNOSIS — I252 Old myocardial infarction: Secondary | ICD-10-CM | POA: Diagnosis not present

## 2024-01-29 DIAGNOSIS — R001 Bradycardia, unspecified: Secondary | ICD-10-CM | POA: Diagnosis not present

## 2024-01-29 DIAGNOSIS — I10 Essential (primary) hypertension: Secondary | ICD-10-CM | POA: Diagnosis not present

## 2024-01-29 NOTE — Telephone Encounter (Signed)
 Appointment made for tomorrow 01/30/2024 at PCP office at 11:20 AM with Dr Clarissa Zafirov  FYI Only or Action Required?: FYI only for provider.  Patient was last seen in primary care on 01/12/2024 by [name] PARAS, DO.  Called Nurse Triage reporting Back Pain.  Symptoms began 2 weeks ago.  Interventions attempted: OTC medications: patient took ibuprofen one night and Rest, hydration, or home remedies.  Symptoms are: unchanged.  Triage Disposition: See PCP When Office is Open (Within 3 Days)  Patient/caregiver understands and will follow disposition?: Yes               Copied from CRM [id number]. Topic: Clinical - Red Word Triage >> Jan 29, 2024  1:31 PM Donee H wrote: [facility] that prompted transfer to Nurse Triage:  Patient experiencing pain in back and lower right side of back. Patient states it's been going on for a week now. He also states his colon is inflamed. He accidentally ate tomatoes and don't know if that has anything to do with it. Reason for Disposition  [1] MODERATE back pain (e.g., interferes with normal activities) AND [2] present > 3 days  Answer Assessment - Initial Assessment Questions 1. ONSET: When did the pain begin? (e.g., minutes, hours, days)     2 weeks ago 2. LOCATION: Where does it hurt? (upper, mid or lower back)     Right lower 3. SEVERITY: How bad is the pain?  (e.g., Scale 1-10; mild, moderate, or severe)     5 4. PATTERN: Is the pain constant? (e.g., yes, no; constant, intermittent)      constant 5. RADIATION: Does the pain shoot into your legs or somewhere else?     ------ 6. CAUSE:  What do you think is causing the back pain?      Not sure but he states that he ate tomatoes accidentally two weeks ago 7. BACK OVERUSE:  Any recent lifting of heavy objects, strenuous work or exercise?     Patient states he ate tomatoes 8. MEDICINES: What have you taken so far for the pain? (e.g., nothing, acetaminophen ,  NSAIDS)     Ibuprofen one night 9. NEUROLOGIC SYMPTOMS: Do you have any weakness, numbness, or problems with bowel/bladder control?     No 10. OTHER SYMPTOMS: Do you have any other symptoms? (e.g., fever, abdomen pain, burning with urination, blood in urine)       Patient denies  Protocols used: Back Pain-A-AH

## 2024-01-29 NOTE — Patient Instructions (Addendum)
 Continue current medications.

## 2024-01-30 ENCOUNTER — Ambulatory Visit (INDEPENDENT_AMBULATORY_CARE_PROVIDER_SITE_OTHER)

## 2024-01-30 VITALS — BP 104/62 | HR 53 | Ht 70.0 in | Wt 165.0 lb

## 2024-01-30 DIAGNOSIS — M549 Dorsalgia, unspecified: Secondary | ICD-10-CM | POA: Insufficient documentation

## 2024-01-30 DIAGNOSIS — M5489 Other dorsalgia: Secondary | ICD-10-CM

## 2024-01-30 DIAGNOSIS — K5900 Constipation, unspecified: Secondary | ICD-10-CM | POA: Diagnosis not present

## 2024-01-30 MED ORDER — TIZANIDINE HCL 4 MG PO TABS: 2.0000 mg | ORAL_TABLET | Freq: Four times a day (QID) | ORAL | 0 refills | Status: AC | PRN

## 2024-01-30 NOTE — Progress Notes (Signed)
 Acute Patient Visit  Physician: Lida Berkery A Japneet Staggs, MD  Patient: [name] MRN: [id number] DOB: Aug 13, 1933 PCP: Edman Marsa PARAS, DO     Subjective:   Chief Complaint  Patient presents with   Back Pain    Right side; hx of diverticulitis; treated with Tizanidine      HPI: The patient is a 89 y.o. male who presents today for:   Discussed the use of AI scribe software for clinical note transcription with the patient, who gave verbal consent to proceed.  History of Present Illness   [name] is an 89 year old male who presents with lower back pain.  Lower back pain - Persistent for over one week - Constant in nature, non-radiating - Worsens with movement - Described as a strain - No associated fever, chills, or dysuria - Limits ibuprofen use due to potential risks, has taken tylenol  with some improvement  Gastrointestinal symptoms - Constipation and bloating - Unsatisfactory bowel movements - Uses a powder to aid bowel movements, which has been ineffective - Maintains hydration - Has a natural powder provided by his wife, but has not used it much - Typically has a bowel movement in the morning        ROS:   As noted in the HPI    ASSESMENT/PLAN:  Encounter Diagnoses  Name Primary?   Other back pain, unspecified chronicity Yes   Constipation, unspecified constipation type     No orders of the defined types were placed in this encounter.   Assessment and Plan    Low back pain Chronic low back pain persisting for over a week, localized to the right lower back. Pain does not radiate and is not associated with fever or chills. Differential diagnosis includes musculoskeletal strain or kidney-related issues, but the pain is more consistent with a musculoskeletal origin. Pain exacerbated by movement and walking, suggesting a back-related issue rather than kidney stones. - Refill tizanidine  prescription for muscle relaxation. - Advise use  of acetaminophen  for pain management. - Caution against excessive use of ibuprofen due to risk of stomach ulcers and kidney issues. - Instruct to report worsening symptoms or pain radiating to the front.  Constipation Chronic constipation with bloating, not significantly improved with previous treatments. No acute abdominal pain on examination. Constipation likely related to age-related slowing of bowel movements. - Recommend use of stool softeners and prunes to aid bowel movements. - Advise increased water intake to help with constipation.        OBJECTIVE: Vitals:   01/30/24 1115  BP: 104/62  Pulse: (!) 53  SpO2: 97%  Weight: 165 lb (74.8 kg)  Height: 5' 10 (1.778 m)    Body mass index is 23.68 kg/m.   Physical Exam Abdominal:     General: Abdomen is flat. There is no distension.     Palpations: There is no mass.     Tenderness: There is no abdominal tenderness.  Musculoskeletal:     Lumbar back: Normal. No bony tenderness.   Paraspinal tenderness noted right lumbar region, soft tissue  Vitals reviewed.  Constitutional:      Appearance: Normal appearance. Well-developed with normal weight.  Cardiovascular:     Rate and Rhythm: Normal rate and regular rhythm. Normal heart sounds. Normal peripheral pulses Pulmonary:     Normal breath sounds with normal effort Skin:    General: Skin is warm and dry without noticeable rash. Neurological:     General: No focal deficit present.  Psychiatric:  Mood and Affect: Mood, behavior and cognition normal          Allergies Patient has no known allergies.  Past Medical History Patient  has a past medical history of CAD (coronary artery disease), Coronary artery disease of native artery of native heart with stable angina pectoris (HCC), Diabetes mellitus without complication (HCC), Grade I diastolic dysfunction, Hyperlipidemia, Hypertension, Neuropathy, Neuropathy, Prostate cancer (HCC), STEMI (ST elevation myocardial  infarction) (HCC) (04/2023), Wears dentures, and Wears hearing aid in both ears.  Surgical History Patient  has a past surgical history that includes Colonoscopy; Cataract extraction w/PHACO (Left, 04/24/2015); Coronary/Graft Acute MI Revascularization (N/A, 05/10/2023); LEFT HEART CATH AND CORONARY ANGIOGRAPHY (N/A, 05/10/2023); and Cataract extraction w/PHACO (Right, 07/29/2023).  Family History Pateint's family history includes COPD in his brother and father; Cancer in his mother; Hearing loss in his son; Stroke in his mother.  Social History Patient  reports that he quit smoking about 45 years ago. His smoking use included cigarettes. He has never used smokeless tobacco. He reports that he does not drink alcohol and does not use drugs.    01/30/2024

## 2024-02-02 ENCOUNTER — Ambulatory Visit: Admitting: Podiatry

## 2024-02-09 DIAGNOSIS — I493 Ventricular premature depolarization: Secondary | ICD-10-CM | POA: Diagnosis not present

## 2024-02-11 NOTE — Procedures (Signed)
 Procedure: Holter monitor   3 day Holter monitor from 01/29/2024 to 9 send 25  Indication: Bradycardia, cardiac arrhythmia  Findings:  Predominant rhythm is sinus rhythm with frequent supraventricular ectopy. Maximum heart rate 120 bpm, minimum heart rate 38 bpm the average heart rate of 64 bpm Frequent 16% PACs and no PVCs noted 80 occurrences of very short lasting supraventricular tachycardia, longest episode 5 beats 0 patient triggers  Impression: Predominant rhythm is sinus rhythm. Frequent supraventricular ectopy with PAC burden of 16%.  Keller Paterson, MD [facility] Cardiology

## 2024-02-13 ENCOUNTER — Other Ambulatory Visit: Payer: Self-pay

## 2024-02-13 DIAGNOSIS — R001 Bradycardia, unspecified: Secondary | ICD-10-CM

## 2024-02-17 ENCOUNTER — Other Ambulatory Visit: Payer: Self-pay | Admitting: Internal Medicine

## 2024-02-17 DIAGNOSIS — E1169 Type 2 diabetes mellitus with other specified complication: Secondary | ICD-10-CM

## 2024-02-18 ENCOUNTER — Ambulatory Visit (INDEPENDENT_AMBULATORY_CARE_PROVIDER_SITE_OTHER): Admitting: Family Medicine

## 2024-02-18 ENCOUNTER — Encounter: Payer: Self-pay | Admitting: Family Medicine

## 2024-02-18 ENCOUNTER — Other Ambulatory Visit: Payer: Self-pay

## 2024-02-18 ENCOUNTER — Ambulatory Visit: Payer: Self-pay | Admitting: Family Medicine

## 2024-02-18 ENCOUNTER — Ambulatory Visit
Admission: RE | Admit: 2024-02-18 | Discharge: 2024-02-18 | Disposition: A | Discharge status: Home / Self Care | Source: Ambulatory Visit | Attending: Family Medicine | Admitting: Family Medicine

## 2024-02-18 VITALS — BP 124/78 | HR 79 | Ht 70.0 in | Wt 167.0 lb

## 2024-02-18 DIAGNOSIS — G8929 Other chronic pain: Secondary | ICD-10-CM | POA: Diagnosis not present

## 2024-02-18 DIAGNOSIS — M5136 Other intervertebral disc degeneration, lumbar region with discogenic back pain only: Secondary | ICD-10-CM | POA: Diagnosis not present

## 2024-02-18 DIAGNOSIS — K5900 Constipation, unspecified: Secondary | ICD-10-CM | POA: Diagnosis not present

## 2024-02-18 DIAGNOSIS — M545 Low back pain, unspecified: Secondary | ICD-10-CM | POA: Diagnosis not present

## 2024-02-18 DIAGNOSIS — I878 Other specified disorders of veins: Secondary | ICD-10-CM | POA: Diagnosis not present

## 2024-02-18 MED ORDER — PREDNISONE 20 MG PO TABS: ORAL_TABLET | ORAL | 0 refills | Status: DC

## 2024-02-18 MED ORDER — ROSUVASTATIN CALCIUM 40 MG PO TABS: 40.0000 mg | ORAL_TABLET | Freq: Every day | ORAL | 0 refills | Status: DC

## 2024-02-18 MED ORDER — ROSUVASTATIN CALCIUM 40 MG PO TABS: 40.0000 mg | ORAL_TABLET | Freq: Every day | ORAL | 3 refills | Status: AC

## 2024-02-18 NOTE — Telephone Encounter (Signed)
 Duplicate request, LRF 02/18/24.  Requested Prescriptions  Pending Prescriptions Disp Refills   rosuvastatin  (CRESTOR ) 40 MG tablet [Pharmacy Med Name: ROSUVASTATIN  CALCIUM  40 MG TAB] 90 tablet 0    Sig: Take 1 tablet (40 mg total) by mouth daily.     Cardiovascular:  Antilipid - Statins 2 Failed - 02/18/2024 10:07 AM      Failed - Lipid Panel in normal range within the last 12 months    Cholesterol, Total  Date Value Ref Range Status  09/04/2015 182 100 - 199 mg/dL Final   Cholesterol  Date Value Ref Range Status  02/19/2023 217 (H) <200 mg/dL Final   LDL Cholesterol (Calc)  Date Value Ref Range Status  02/19/2023 133 (H) mg/dL (calc) Final    Comment:    Reference range: <100 . Desirable range <100 mg/dL for primary prevention;   <70 mg/dL for patients with CHD or diabetic patients  with > or = 2 CHD risk factors. SABRA LDL-C is now calculated using the Martin-Hopkins  calculation, which is a validated novel method providing  better accuracy than the Friedewald equation in the  estimation of LDL-C.  Gladis APPLETHWAITE et al. SANDREA. 7986;689(80): 2061-2068  (http://education.Quest[employer].com/faq/FAQ164)    HDL  Date Value Ref Range Status  02/19/2023 36 (L) > OR = 40 mg/dL Final  95/89/7982 32 (L) >39 mg/dL Final   Triglycerides  Date Value Ref Range Status  02/19/2023 337 (H) <150 mg/dL Final    Comment:    . If a non-fasting specimen was collected, consider repeat triglyceride testing on a fasting specimen if clinically indicated.  Veatrice et al. J. of Clin. Lipidol. 2015;9:129-169. SABRA          Passed - Cr in normal range and within 360 days    Creat  Date Value Ref Range Status  02/19/2023 1.29 (H) 0.70 - 1.22 mg/dL Final   Creatinine, Ser  Date Value Ref Range Status  05/12/2023 0.89 0.61 - 1.24 mg/dL Final   Creatinine, Urine  Date Value Ref Range Status  02/27/2022 74 20 - 320 mg/dL Final         Passed - Patient is not pregnant      Passed - Valid  encounter within last 12 months    Recent Outpatient Visits           2 weeks ago Other back pain, unspecified chronicity   [place] [facility] [place], Parris LABOR, MD   1 month ago Bradycardia   [place] [facility] Edman Marsa PARAS, DO   1 month ago Bradycardia   [place] [facility], [place] A, MD   2 months ago Acute bilateral low back pain without sciatica   [place] [facility] [place], [place] W, NP   5 months ago Type 2 diabetes mellitus with diabetic polyneuropathy, without long-term current use of insulin [facility])   [place] [facility] [place], Marsa PARAS, [place]

## 2024-02-18 NOTE — Progress Notes (Addendum)
 Subjective:    Patient ID: [name], male    DOB: 1933-09-27, 89 y.o.   MRN: [id number]  [name] is a 89 y.o. male presenting on 02/18/2024 for Back Pain   HPI  Discussed the use of AI scribe software for clinical note transcription with the patient, who gave verbal consent to proceed.  History of Present Illness   [name] is an 89 year old male with chronic back pain who presents with worsening back pain and constipation.  Chronic low back pain, Right sided Chronic problem episodic flares 2+ months. Has seen our office 2 other providers 2 visits in past 2 months. - Persistent pain located in the lower right side of the back, described as deep and non-radiating. He has significant peripheral neuropathy so cannot necessarily feel or tell if radiating nerve pain.  - Exacerbated by prolonged sitting and leaning back against a chair - Interferes with sleep and ambulation - Previous treatments including topical creams, muscle relaxers, Tylenol , exercises, and Zanaflex  have been ineffective - He cannot take NSAID due to blood thinners - He is on Lyrica  75 TWICE A DAY for neuropathy and does not seem to help his back  Constipation - Constipation with bowel movements occurring every 1-2 days - History of diverticulitis treated approximately one year ago but no active symptoms - Previous use of Miralax  without significant relief, possibly due to insufficient dosing - No dietary modifications such as increased intake of prunes attempted      Peripheral neuropathy - Neuropathy in feet with worsening numbness and balance issues - Uses topical cream and has tried [employer] without significant relief - Previously evaluated by podiatrist and recommended various treatments, but continues to have significant symptoms - Tried gabapentin  and Lyrica  in the past without much success - Takes beet tablets for circulation          02/18/2024    1:29 PM 01/09/2024    9:56 AM  11/27/2023   11:33 AM  Depression screen PHQ 2/9  Decreased Interest 0 1 1  Down, Depressed, Hopeless 0 1 0  PHQ - 2 Score 0 2 1  Altered sleeping 0 0 0  Tired, decreased energy 0 1 1  Change in appetite 0 0 0  Feeling bad or failure about yourself  0 0 0  Trouble concentrating 0 0 0  Moving slowly or fidgety/restless 0 0 0  Suicidal thoughts 0 0 0  PHQ-9 Score 0 3 2  Difficult doing work/chores  Somewhat difficult Not difficult at all       02/18/2024    1:29 PM 01/09/2024    9:56 AM 11/27/2023   11:33 AM 08/28/2023   10:49 AM  GAD 7 : Generalized Anxiety Score  Nervous, Anxious, on Edge 0 0 0 0  Control/stop worrying 0 0 0 0  Worry too much - different things 0 0 0 0  Trouble relaxing 0 0 0 0  Restless 0 0 0 0  Easily annoyed or irritable 0 0 0 0  Afraid - awful might happen 0 0 0 0  Total GAD 7 Score 0 0 0 0  Anxiety Difficulty  Not difficult at all Not difficult at all     Social History   Tobacco Use   Smoking status: Former    Current packs/day: 0.00    Types: Cigarettes    Quit date: 05/27/1978    Years since quitting: 45.7   Smokeless tobacco: Never  Vaping Use  Vaping status: Never Used  Substance Use Topics   Alcohol use: No    Alcohol/week: 0.0 standard drinks of alcohol   Drug use: No    Review of Systems Per HPI unless specifically indicated above     Objective:    BP 124/78 (BP Location: Right Arm, Patient Position: Sitting, Cuff Size: Normal)   Pulse 79   Ht 5' 10 (1.778 m)   Wt 167 lb (75.8 kg)   SpO2 96%   BMI 23.96 kg/m   Wt Readings from Last 3 Encounters:  02/18/24 167 lb (75.8 kg)  01/30/24 165 lb (74.8 kg)  01/12/24 165 lb (74.8 kg)    Physical Exam Vitals and nursing note reviewed.  Constitutional:      General: He is not in acute distress.    Appearance: Normal appearance. He is well-developed. He is not diaphoretic.     Comments: Well-appearing, comfortable, cooperative  HENT:     Head: Normocephalic and atraumatic.  Eyes:      General:        Right eye: No discharge.        Left eye: No discharge.     Conjunctiva/sclera: Conjunctivae normal.  Cardiovascular:     Rate and Rhythm: Normal rate.  Pulmonary:     Effort: Pulmonary effort is normal.  Musculoskeletal:     Comments: R lower back area with hypertonicity spasm, no focal bony tenderness. Some symptoms provoked on extension. No radiating symptoms in lower extremities, negative SLR.  Skin:    General: Skin is warm and dry.     Findings: No erythema or rash.  Neurological:     Mental Status: He is alert and oriented to person, place, and time.  Psychiatric:        Mood and Affect: Mood normal.        Behavior: Behavior normal.        Thought Content: Thought content normal.     Comments: Well groomed, good eye contact, normal speech and thoughts    I have personally reviewed the radiology report from 02/18/24 KUB X-ray.  Narrative & Impression  EXAM: 1 VIEW XRAY OF THE ABDOMEN 02/18/2024 02:19:00 PM   COMPARISON: 07/24/22.   CLINICAL HISTORY: Constipation. L-Spine: Midline and right sided lower back pain x 1 month, no specific injury, denies leg pain, h/o neuropathy, h/o prostate cancer; Abdomen: Constipation, bloating, denies pain, denies nausea or vomiting.   FINDINGS:   BOWEL: Nonobstructive bowel gas pattern. Moderate colonic stool burden.   SOFT TISSUES: Prostate brachytherapy seeds in place. Left pelvic phlebolith. No opaque urinary calculi.   BONES: No acute osseous abnormality.   IMPRESSION: 1. Moderate colonic stool burden. No signs of obstruction.   Electronically signed by: Waddell Calk MD 02/18/2024 02:38 PM EDT RP Workstation: [id number]    I have personally reviewed the radiology report from 02/18/24 Lumbar Spine X-ray.  Narrative & Impression  CLINICAL DATA:  acute on chronic R sided low back pain   EXAM: LUMBAR SPINE - COMPLETE 4+ VIEW   COMPARISON:  None Available.   FINDINGS: Five non rib-bearing  lumbar type vertebral bodies. Hypoplastic ribs on T12. Normal alignment with expected lumbar lordosis. Vertebral body heights are well maintained without acute fracture. No pars interarticularis defects.Mild intervertebral disc height loss involving the L3-L4, L4-L5, and L5-S1 disc spaces. Multilevel osteophyte formation. Diffuse aortoiliac atherosclerosis.   IMPRESSION: 1. No acute fracture or malalignment of the lumbar spine. 2. Mild multilevel degenerative disc disease of the lumbar spine.  Electronically Signed   By: Rogelia Myers M.D.   On: 02/18/2024 14:45     Assessment & Plan:   Problem List Items Addressed This Visit     Back pain - Primary   Relevant Medications   predniSONE  (DELTASONE ) 20 MG tablet   Other Relevant Orders   DG Lumbar Spine Complete (Completed)   Constipation   Relevant Orders   DG Abd 1 View (Completed)     Chronic right lower back pain Persistent, worsening pain on the right side, non-radiating, not crossing midline. Previous treatments ineffective, tylenol  and tizanidine , baclofen , muscle relaxants.   No urinary symptoms. He asks about diverticulitis, however no diarrhea, no blood in stool, no fever, no abdominal pain, seems unlikely.  Differential includes musculoskeletal strain or arthritis. Stronger pain medications risk constipation, and also NSAIDs risk with his blood thinner regimen. Limited options. He is already on Lyrica  75mg  TWICE A DAY for his neuropathy peripheral  No recent or any imaging on file.  Reviewed recent 2 visits for back pain, limited success in past 2+ months now.  - Order x-ray of spine and abdomen to assess structural abnormalities. - Prescribe prednisone  to reduce inflammation and pain. - Consider referral to back specialist for further evaluation and management if we are unsuccessful with current treatment plan or based on X-ray results. May warrant formal PT eval  Chronic constipation Infrequent bowel  movements, previous Miralax  dosing insufficient. Constipation may worsen with pain and potentially if stronger medications are taken. - Check KUB X-ray today - Increase Miralax  dosage to multiple scoops per day. Advised 2 to 4 scoops per drink per dose - Advise prunes as adjunct, emphasize Miralax  for relief.      Update 02/18/24 556pm  Called patient. Reviewed both KUB and Lumbar X-ray results.   Moderate colonic stool burden, discussed constipation could be causing some of his symptoms.   Also lumbar spine shows mild DDD multiple levels.   He will start Miralax  as advised and continue conservative treatments for back / spine.   He will call back within 1 week if no improvement after miralax  course treating constipation, and if / when he is ready to be referred to Physical Therapy for his back.  Orders Placed This Encounter  Procedures   DG Lumbar Spine Complete    Standing Status:   Future    Number of Occurrences:   1    Expiration Date:   02/17/2025    Reason for Exam (SYMPTOM  OR DIAGNOSIS REQUIRED):   acute on chronic R sided low back pain    Preferred imaging location?:   [facility] B[facility] Abd 1 View    Standing Status:   Future    Number of Occurrences:   1    Expiration Date:   04/19/2025    Reason for Exam (SYMPTOM  OR DIAGNOSIS REQUIRED):   Constipation    Preferred imaging location?:   [facility]    Meds ordered this encounter  Medications   predniSONE  (DELTASONE ) 20 MG tablet    Sig: Take daily with food. Start with 60mg  (3 pills) x 2 days, then reduce to 40mg  (2 pills) x 2 days, then 20mg  (1 pill) x 3 days    Dispense:  13 tablet    Refill:  0    Follow up plan: Return if symptoms worsen or fail to improve.  Marsa Officer, DO [facility] 02/18/2024, 1:35 PM

## 2024-02-18 NOTE — Patient Instructions (Addendum)
 X-rays today  Pending results  We will call with results and consider referral  Start prednisone  taper.  For Constipation (less frequent bowel movement that can be hard dry or involve straining).  Recommend trying OTC Miralax  17g = 1 capful in large glass water once daily for now, try several days to see if working, goal is soft stool or BM 1-2 times daily, if too loose then reduce dose or try every other day. If not effective may need to increase it to 2 doses at once in AM or may do 1 in morning and 1 in afternoon/evening  - This medicine is very safe and can be used often without any problem and will not make you dehydrated. It is good for use on AS NEEDED BASIS or even MAINTENANCE therapy for longer term for several days to weeks at a time to help regulate bowel movements  Other more natural remedies or preventative treatment: - Increase hydration with water - Increase fiber in diet (high fiber foods = vegetables, leafy greens, oats/grains) - May take OTC Fiber supplement (metamucil powder or pill/gummy) - May try OTC Probiotic    Please schedule a Follow-up Appointment to: Return if symptoms worsen or fail to improve.  If you have any other questions or concerns, please feel free to call the office or send a message through MyChart. You may also schedule an earlier appointment if necessary.  Additionally, you may be receiving a survey about your experience at our office within a few days to 1 week by e-mail or mail. We value your feedback.  Marsa Officer, DO [facility], [place]

## 2024-02-18 NOTE — Addendum Note (Signed)
 Addended by: EDMAN MARSA PARAS on: 02/18/2024 01:48 PM   Modules accepted: Orders

## 2024-02-25 DIAGNOSIS — E119 Type 2 diabetes mellitus without complications: Secondary | ICD-10-CM | POA: Diagnosis not present

## 2024-02-25 DIAGNOSIS — H35373 Puckering of macula, bilateral: Secondary | ICD-10-CM | POA: Diagnosis not present

## 2024-02-25 DIAGNOSIS — H26491 Other secondary cataract, right eye: Secondary | ICD-10-CM | POA: Diagnosis not present

## 2024-02-26 DIAGNOSIS — I252 Old myocardial infarction: Secondary | ICD-10-CM | POA: Diagnosis not present

## 2024-02-26 DIAGNOSIS — E782 Mixed hyperlipidemia: Secondary | ICD-10-CM | POA: Diagnosis not present

## 2024-02-26 DIAGNOSIS — I2510 Atherosclerotic heart disease of native coronary artery without angina pectoris: Secondary | ICD-10-CM | POA: Diagnosis not present

## 2024-02-26 DIAGNOSIS — I491 Atrial premature depolarization: Secondary | ICD-10-CM | POA: Diagnosis not present

## 2024-03-02 ENCOUNTER — Other Ambulatory Visit

## 2024-03-02 DIAGNOSIS — E1142 Type 2 diabetes mellitus with diabetic polyneuropathy: Secondary | ICD-10-CM

## 2024-03-02 DIAGNOSIS — Z0000 Encounter for general adult medical examination without abnormal findings: Secondary | ICD-10-CM

## 2024-03-02 DIAGNOSIS — E1169 Type 2 diabetes mellitus with other specified complication: Secondary | ICD-10-CM | POA: Diagnosis not present

## 2024-03-02 DIAGNOSIS — I25118 Atherosclerotic heart disease of native coronary artery with other forms of angina pectoris: Secondary | ICD-10-CM

## 2024-03-02 DIAGNOSIS — I129 Hypertensive chronic kidney disease with stage 1 through stage 4 chronic kidney disease, or unspecified chronic kidney disease: Secondary | ICD-10-CM

## 2024-03-02 DIAGNOSIS — E538 Deficiency of other specified B group vitamins: Secondary | ICD-10-CM

## 2024-03-02 DIAGNOSIS — R351 Nocturia: Secondary | ICD-10-CM | POA: Diagnosis not present

## 2024-03-02 DIAGNOSIS — E785 Hyperlipidemia, unspecified: Secondary | ICD-10-CM | POA: Diagnosis not present

## 2024-03-03 DIAGNOSIS — G629 Polyneuropathy, unspecified: Secondary | ICD-10-CM | POA: Diagnosis not present

## 2024-03-03 DIAGNOSIS — R2689 Other abnormalities of gait and mobility: Secondary | ICD-10-CM | POA: Diagnosis not present

## 2024-03-03 DIAGNOSIS — R202 Paresthesia of skin: Secondary | ICD-10-CM | POA: Diagnosis not present

## 2024-03-03 DIAGNOSIS — R200 Anesthesia of skin: Secondary | ICD-10-CM | POA: Diagnosis not present

## 2024-03-03 LAB — LIPID PANEL
Cholesterol: 96 mg/dL (ref <200)
HDL: 34 mg/dL — ABNORMAL LOW (ref >=40)
LDL Cholesterol (Calc): 32 mg/dL
Non-HDL Cholesterol (Calc): 62 mg/dL (ref <130)
Total CHOL/HDL Ratio: 2.8 (calc) (ref <5.0)
Triglycerides: 240 mg/dL — ABNORMAL HIGH (ref <150)

## 2024-03-03 LAB — PSA: PSA: 0.17 ng/mL (ref <=4.00)

## 2024-03-03 LAB — CBC WITH DIFFERENTIAL/PLATELET
Absolute Lymphocytes: 3109 {cells}/uL (ref 850–3900)
Absolute Monocytes: 861 {cells}/uL (ref 200–950)
Basophils Absolute: 59 {cells}/uL (ref 0–200)
Basophils Relative: 0.6 %
Eosinophils Absolute: 119 {cells}/uL (ref 15–500)
Eosinophils Relative: 1.2 %
HCT: 50.5 % — ABNORMAL HIGH (ref 38.5–50.0)
Hemoglobin: 16.9 g/dL (ref 13.2–17.1)
MCH: 29.2 pg (ref 27.0–33.0)
MCHC: 33.5 g/dL (ref 32.0–36.0)
MCV: 87.4 fL (ref 80.0–100.0)
MPV: 10.6 fL (ref 7.5–12.5)
Monocytes Relative: 8.7 %
Neutro Abs: 5752 {cells}/uL (ref 1500–7800)
Neutrophils Relative %: 58.1 %
Platelets: 132 Thousand/uL — ABNORMAL LOW (ref 140–400)
RBC: 5.78 Million/uL (ref 4.20–5.80)
RDW: 13.3 % (ref 11.0–15.0)
Total Lymphocyte: 31.4 %
WBC: 9.9 Thousand/uL (ref 3.8–10.8)

## 2024-03-03 LAB — COMPREHENSIVE METABOLIC PANEL WITH GFR
AG Ratio: 1.7 (calc) (ref 1.0–2.5)
ALT: 16 U/L (ref 9–46)
AST: 20 U/L (ref 10–35)
Albumin: 4.5 g/dL (ref 3.6–5.1)
Alkaline phosphatase (APISO): 49 U/L (ref 35–144)
BUN: 25 mg/dL (ref 7–25)
CO2: 28 mmol/L (ref 20–32)
Calcium: 9.8 mg/dL (ref 8.6–10.3)
Chloride: 98 mmol/L (ref 98–110)
Creat: 1.18 mg/dL (ref 0.70–1.22)
Globulin: 2.7 g/dL (ref 1.9–3.7)
Glucose, Bld: 202 mg/dL — ABNORMAL HIGH (ref 65–99)
Potassium: 4.8 mmol/L (ref 3.5–5.3)
Sodium: 135 mmol/L (ref 135–146)
Total Bilirubin: 0.7 mg/dL (ref 0.2–1.2)
Total Protein: 7.2 g/dL (ref 6.1–8.1)
eGFR: 60 mL/min/1.73m2 (ref >=60)

## 2024-03-03 LAB — TSH: TSH: 1.4 m[IU]/L (ref 0.40–4.50)

## 2024-03-03 LAB — MICROALBUMIN / CREATININE URINE RATIO
Creatinine, Urine: 77 mg/dL (ref 20–320)
Microalb Creat Ratio: 10 mg/g{creat} (ref <30)
Microalb, Ur: 0.8 mg/dL

## 2024-03-03 LAB — HEMOGLOBIN A1C
Hgb A1c MFr Bld: 9.1 % — ABNORMAL HIGH (ref <5.7)
Mean Plasma Glucose: 214 mg/dL
eAG (mmol/L): 11.9 mmol/L

## 2024-03-03 LAB — VITAMIN B12: Vitamin B-12: 624 pg/mL (ref 200–1100)

## 2024-03-05 DIAGNOSIS — H26491 Other secondary cataract, right eye: Secondary | ICD-10-CM | POA: Diagnosis not present

## 2024-03-08 ENCOUNTER — Encounter: Payer: Self-pay | Admitting: Podiatry

## 2024-03-08 ENCOUNTER — Ambulatory Visit: Admitting: Podiatry

## 2024-03-08 DIAGNOSIS — E1142 Type 2 diabetes mellitus with diabetic polyneuropathy: Secondary | ICD-10-CM | POA: Diagnosis not present

## 2024-03-08 DIAGNOSIS — M79674 Pain in right toe(s): Secondary | ICD-10-CM

## 2024-03-08 DIAGNOSIS — M79675 Pain in left toe(s): Secondary | ICD-10-CM

## 2024-03-08 DIAGNOSIS — B351 Tinea unguium: Secondary | ICD-10-CM

## 2024-03-08 NOTE — Progress Notes (Signed)
  Subjective:  Patient ID: [name], male    DOB: 08-28-1933,  MRN: [id number]  89 y.o. male presents at risk foot care with history of diabetic neuropathy and painful thick toenails that are difficult to trim. Pain interferes with ambulation. Aggravating factors include wearing enclosed shoe gear. Pain is relieved with periodic professional debridement. Patient states his doctor has increased the dosage of his Lyrica  to 100 mg bid for painful neuropathy. He states the Nervive Cream did not work for him. Chief Complaint  Patient presents with   Toe Pain    Diabetic foot care. Dr. Ronelle is his PCP. His A1c is  9.1 and his last visit was in Sept 2025    New problem(s): None   PCP is Edman Marsa PARAS, DO.  No Known Allergies  Review of Systems: Negative except as noted in the HPI.   Objective:  [name] is a pleasant 89 y.o. male WD, WN in NAD. AAO x 3.  Vascular Examination: Vascular status intact b/l with palpable pedal pulses. CFT immediate b/l. Pedal hair present. No edema. No pain with calf compression b/l. Skin temperature gradient WNL b/l. No varicosities noted. No cyanosis or clubbing noted.  Neurological Examination: Pt has subjective symptoms of neuropathy. Sensation grossly intact b/l with 10 gram monofilament.   Dermatological Examination: Pedal skin with normal turgor, texture and tone b/l. No open wounds nor interdigital macerations noted. Toenails 1-5 b/l thick, discolored, elongated with subungual debris and pain on dorsal palpation. No corns, calluses nor porokeratotic lesions noted.  Musculoskeletal Examination: Muscle strength 5/5 to all LE muscle groups of BLE. Palpable exostosis noted 1st met cuneiform joint of both feet. No pain, crepitus or joint limitation noted with ROM bilateral LE.  Radiographs: None  Last A1c:      Latest Ref Rng & Units 03/02/2024    8:23 AM 08/28/2023   10:54 AM  Hemoglobin A1C  Hemoglobin-A1c <5.7 % 9.1  7.5     Assessment:   1. Pain due to onychomycosis of toenails of both feet   2. Diabetic peripheral neuropathy associated with type 2 diabetes mellitus (HCC)    Plan:  Patient was evaluated and treated. All patient's and/or POA's questions/concerns addressed on today's visit. 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  Continue daily foot inspections and monitor blood glucose per PCP/Endocrinologist's recommendations.Continue soft, supportive shoe gear daily. Report any pedal injuries to medical professional. Call office if there are any quesitons/concerns. -Patient/POA to call should there be question/concern in the interim.  Return in about 3 months (around 06/08/2024).  Delon LITTIE Merlin, DPM      [facility] LOCATION: [address] [place] [address]                   Office [phone]   [facility] LOCATION: [address] [place], [place] [address] Office [phone]

## 2024-03-17 ENCOUNTER — Other Ambulatory Visit: Payer: Self-pay

## 2024-03-17 DIAGNOSIS — I10 Essential (primary) hypertension: Secondary | ICD-10-CM

## 2024-03-17 DIAGNOSIS — R001 Bradycardia, unspecified: Secondary | ICD-10-CM

## 2024-03-23 ENCOUNTER — Ambulatory Visit: Admitting: Family Medicine

## 2024-03-23 ENCOUNTER — Encounter: Payer: Self-pay | Admitting: Family Medicine

## 2024-03-23 VITALS — BP 136/78 | HR 85 | Ht 70.0 in | Wt 167.2 lb

## 2024-03-23 DIAGNOSIS — E538 Deficiency of other specified B group vitamins: Secondary | ICD-10-CM

## 2024-03-23 DIAGNOSIS — E1142 Type 2 diabetes mellitus with diabetic polyneuropathy: Secondary | ICD-10-CM

## 2024-03-23 DIAGNOSIS — I25118 Atherosclerotic heart disease of native coronary artery with other forms of angina pectoris: Secondary | ICD-10-CM | POA: Diagnosis not present

## 2024-03-23 DIAGNOSIS — E785 Hyperlipidemia, unspecified: Secondary | ICD-10-CM

## 2024-03-23 DIAGNOSIS — G8929 Other chronic pain: Secondary | ICD-10-CM

## 2024-03-23 DIAGNOSIS — I10 Essential (primary) hypertension: Secondary | ICD-10-CM | POA: Diagnosis not present

## 2024-03-23 DIAGNOSIS — E1169 Type 2 diabetes mellitus with other specified complication: Secondary | ICD-10-CM

## 2024-03-23 DIAGNOSIS — Z23 Encounter for immunization: Secondary | ICD-10-CM

## 2024-03-23 DIAGNOSIS — N183 Chronic kidney disease, stage 3 unspecified: Secondary | ICD-10-CM

## 2024-03-23 DIAGNOSIS — Z0000 Encounter for general adult medical examination without abnormal findings: Secondary | ICD-10-CM | POA: Diagnosis not present

## 2024-03-23 DIAGNOSIS — Z7984 Long term (current) use of oral hypoglycemic drugs: Secondary | ICD-10-CM

## 2024-03-23 DIAGNOSIS — G720 Drug-induced myopathy: Secondary | ICD-10-CM

## 2024-03-23 DIAGNOSIS — I129 Hypertensive chronic kidney disease with stage 1 through stage 4 chronic kidney disease, or unspecified chronic kidney disease: Secondary | ICD-10-CM

## 2024-03-23 MED ORDER — EZETIMIBE 10 MG PO TABS: 10.0000 mg | ORAL_TABLET | Freq: Every day | ORAL | 3 refills | Status: AC

## 2024-03-23 MED ORDER — METFORMIN HCL ER 500 MG PO TB24: 500.0000 mg | ORAL_TABLET | Freq: Every day | ORAL | 3 refills | Status: AC

## 2024-03-23 NOTE — Progress Notes (Signed)
 Subjective:    Patient ID: [name], male    DOB: 04/14/1934, 89 y.o.   MRN: [id number]  [name] is a 89 y.o. male presenting on 03/23/2024 for Annual Exam   HPI  Discussed the use of AI scribe software for clinical note transcription with the patient, who gave verbal consent to proceed.  History of Present Illness   [name] is an 89 year old male who presents for an annual physical exam.  Upper respiratory symptoms / Chronic Rhinitis - Chronic cough with clear phlegm - Runny nose lasting about half a day - Uses Mucinex  D over the counter, which is helpful - Uses Atrovent  nasal spray as needed, approximately every two to three days Unsure which spray he has left  Hyperlipidemia management - On rosuvastatin  and Zetia  for cholesterol management now adhering. - Previously discontinued statin but currently taking both medications - LDL cholesterol reduced from 130 to 30  R sided Persistent Low Back Pain Musculoskeletal pain - Persistent back pain located in the muscle area, not near the spine. Issue present for past 4-6 weeks now various treatments tried, no resolution yet - Manages pain with heating pad, muscle relaxant  Neuropathic pain management - On pregabalin , recently increased from 75 mg to 100 mg twice daily by neurologist - Has not yet started new dosage, finishing current supply 75mg  dose  Type 2 Diabetes with Neuropathy - Hemoglobin A1c increased to 9.1 - Attributes elevated A1c to increased consumption of desserts, bread, and milkshakes  Followed by Podiatrist for nail trimming and he has good circulation but neuropathy with pain and numb. Using nerve cream for feet He has diabetic shoes He is on Metformin  XR 500mg  daily On Lisinopril  low dose 5mg . On Lyrica  Pregabalin  75mg  TWICE A DAY -but recently RAISED by [facility] to 100mg  twice a day has orders from Neuro, finishing 75mg  dosage first Failed Gabapentin  and nerve  supplement. Follows with Dr Mevelyn for Eyes  HYPERLIPIDEMIA: - Reports no concerns. Last lipid panel 02/2024, controlled with LDL 30s, last result was 130s - Currently taking Rosuvastatin  40mg  + Zetia  10mg , tolerating well without side effects or myalgias In past he had myalgia drug myopathy but seems to do better now  CHRONIC HTN: Controlled BP Current Meds - Metoprolol  XL 50mg  daily   Reports good compliance, took meds today. Tolerating well, w/o complaints. Denies CP, dyspnea, HA, edema, dizziness / lightheadedness  CAD, history STEMI, PAC Followed by [facility]iology at [facility] on DAPT Plavix ASA, he will be taken OFF of Aspirin  05/09/24 and continue Plavix On Metoprolol  XL Admits easy bruising    Health Maintenance:  High dose Flu Shot and Prevnar-20 vaccine today, last pneumonia vaccine 2017  Future COVID Booster at [facility]  PSA 0.17 previous 0.15 negative, surveillance with history of prior Prostate Cancer. S/p radiation therapy.       03/23/2024    8:53 AM 02/18/2024    1:29 PM 01/09/2024    9:56 AM  Depression screen PHQ 2/9  Decreased Interest 0 0 1  Down, Depressed, Hopeless 0 0 1  PHQ - 2 Score 0 0 2  Altered sleeping 0 0 0  Tired, decreased energy 1 0 1  Change in appetite 0 0 0  Feeling bad or failure about yourself  0 0 0  Trouble concentrating 0 0 0  Moving slowly or fidgety/restless 0 0 0  Suicidal thoughts 0 0 0  PHQ-9 Score 1 0 3  Difficult doing work/chores  Not difficult at all  Somewhat difficult       02/18/2024    1:29 PM 01/09/2024    9:56 AM 11/27/2023   11:33 AM 08/28/2023   10:49 AM  GAD 7 : Generalized Anxiety Score  Nervous, Anxious, on Edge 0 0 0 0  Control/stop worrying 0 0 0 0  Worry too much - different things 0 0 0 0  Trouble relaxing 0 0 0 0  Restless 0 0 0 0  Easily annoyed or irritable 0 0 0 0  Afraid - awful might happen 0 0 0 0  Total GAD 7 Score 0 0 0 0  Anxiety Difficulty  Not difficult at all Not  difficult at all      Past Medical History:  Diagnosis Date   CAD (coronary artery disease)    Coronary artery disease of native artery of native heart with stable angina pectoris    Diabetes mellitus without complication (HCC)    Grade I diastolic dysfunction    Hyperlipidemia    Hypertension    Neuropathy    Neuropathy    feet   Prostate cancer (HCC)    STEMI (ST elevation myocardial infarction) (HCC) 04/2023   Wears dentures    partial upper and lower   Wears hearing aid in both ears    Past Surgical History:  Procedure Laterality Date   CATARACT EXTRACTION W/PHACO Left 04/24/2015   Procedure: CATARACT EXTRACTION PHACO AND INTRAOCULAR LENS PLACEMENT (IOC);  Surgeon: Steven Dingeldein, MD;  Location: [facility];  Service: Ophthalmology;  Laterality: Left;  US  01:12 AP% 25.7 CDE 28.30 fluid pack lot #[id number]   CATARACT EXTRACTION W/PHACO Right 07/29/2023   Procedure: CATARACT EXTRACTION PHACO AND INTRAOCULAR LENS PLACEMENT (IOC) RIGHT 10.04 01:04.1;  Surgeon: Jaye Fallow, MD;  Location: [facility];  Service: Ophthalmology;  Laterality: Right;   COLONOSCOPY     CORONARY/GRAFT ACUTE MI REVASCULARIZATION N/A 05/10/2023   Procedure: Coronary/Graft Acute MI Revascularization;  Surgeon: Florencio Cara BIRCH, MD;  Location: [facility];  Service: Cardiovascular;  Laterality: N/A;   LEFT HEART CATH AND CORONARY ANGIOGRAPHY N/A 05/10/2023   Procedure: LEFT HEART CATH AND CORONARY ANGIOGRAPHY;  Surgeon: Florencio Cara BIRCH, MD;  Location: [facility] INVAS[facility] CV [facility];  Service: Cardiovascular;  Laterality: N/A;   Social History   Socioeconomic History   Marital status: Widowed    Spouse name: Not on file   Number of children: Not on file   Years of education: Not on file   Highest education level: High school graduate  Occupational History   Not on file  Tobacco Use   Smoking status: Former    Current packs/day: 0.00    Types: Cigarettes    Quit date: 05/27/1978     Years since quitting: 45.8   Smokeless tobacco: Never  Vaping Use   Vaping status: Never Used  Substance and Sexual Activity   Alcohol use: No    Alcohol/week: 0.0 standard drinks of alcohol   Drug use: No   Sexual activity: Not on file  Other Topics Concern   Not on file  Social History Narrative   Lives in retirement community    Social Drivers of Health   Financial Resource Strain: Low Risk  (03/03/2024)   Received from [facility]   Overall Financial Resource Strain (CARDIA)    Difficulty of Paying Living Expenses: Not hard at all  Food Insecurity: No Food Insecurity (03/03/2024)   [facility] from [facility]  Hunger Vital Sign    Within the past 12 months, you worried that your food would run out before you got the money to buy more.: Never true    Within the past 12 months, the food you bought just didn't last and you didn't have money to get more.: Never true  Transportation Needs: No Transportation Needs (03/03/2024)   Received from [facility] - Transportation    In the past 12 months, has lack of transportation kept you from medical appointments or from getting medications?: No    Lack of Transportation (Non-Medical): No  Physical Activity: Insufficiently Active (05/09/2023)   Exercise Vital Sign    Days of Exercise per Week: 2 days    Minutes of Exercise per Session: 10 min  Stress: No Stress Concern Present (05/09/2023)   [employer] of Occupational Health - Occupational Stress Questionnaire    Feeling of Stress : Not at all  Social Connections: Socially Integrated (05/09/2023)   Social Connection and Isolation Panel    Frequency of Communication with Friends and Family: More than three times a week    Frequency of Social Gatherings with Friends and Family: Twice a week    Attends Religious Services: More than 4 times per year    Active Member of [employer] or Organizations: Yes    Attends Museum/gallery Exhibitions Officer: More than 4 times per year    Marital Status: Married  Catering Manager Violence: Not At Risk (05/13/2023)   Humiliation, Afraid, Rape, and Kick questionnaire    Fear of Current or Ex-Partner: No    Emotionally Abused: No    Physically Abused: No    Sexually Abused: No   Family History  Problem Relation Age of Onset   Stroke Mother    Cancer Mother        colon cancer   COPD Father    COPD Brother    Hearing loss Son    Current Outpatient Medications on File Prior to Visit  Medication Sig   aspirin  81 MG chewable tablet Chew 1 tablet (81 mg total) by mouth daily.   clopidogrel (PLAVIX) 75 MG tablet Take 75 mg by mouth at bedtime.   colchicine  0.6 MG tablet If acute flare only - Day 1: take 2 pills first day, then repeat 1 pill within two hours. Day 2 - take 1 pill daily for up to max 7 days or stop when gout flare resolves.   diclofenac  Sodium (VOLTAREN ) 1 % GEL Apply 2 g topically 4 (four) times daily as needed (elbow bursitis).   fluticasone  (FLONASE ) 50 MCG/ACT nasal spray Place 2 sprays into both nostrils daily. Use for 4-6 weeks then stop and use seasonally or as needed.   ipratropium (ATROVENT ) 0.06 % nasal spray Place 2 sprays into both nostrils 4 (four) times daily. For up to 5-7 days then stop.   metoprolol  succinate (TOPROL -XL) 25 MG 24 hr tablet Take 1 tablet (25 mg total) by mouth daily. Take with or immediately following a meal.   montelukast  (SINGULAIR ) 10 MG tablet Take 1 tablet (10 mg total) by mouth at bedtime.   polyethylene glycol powder (GLYCOLAX /MIRALAX ) 17 GM/SCOOP powder Take 17-34 g by mouth daily as needed for moderate constipation or mild constipation.   pregabalin  (LYRICA ) 100 MG capsule Take 100 mg by mouth 2 (two) times daily.   rosuvastatin  (CRESTOR ) 40 MG tablet Take 1 tablet (40 mg total) by mouth daily.   ticagrelor  (BRILINTA ) 90 MG TABS  tablet Take 1 tablet (90 mg total) by mouth 2 (two) times daily.   tiZANidine  (ZANAFLEX ) 4 MG  tablet Take 0.5 tablets (2 mg total) by mouth every 6 (six) hours as needed for muscle spasms.   No current facility-administered medications on file prior to visit.    Review of Systems  Constitutional:  Negative for activity change, appetite change, chills, diaphoresis, fatigue and fever.  HENT:  Negative for congestion and hearing loss.   Eyes:  Negative for visual disturbance.  Respiratory:  Negative for cough, chest tightness, shortness of breath and wheezing.   Cardiovascular:  Negative for chest pain, palpitations and leg swelling.  Gastrointestinal:  Negative for abdominal pain, constipation, diarrhea, nausea and vomiting.  Genitourinary:  Negative for dysuria, frequency and hematuria.  Musculoskeletal:  Negative for arthralgias and neck pain.  Skin:  Negative for rash.  Neurological:  Negative for dizziness, weakness, light-headedness, numbness and headaches.  Hematological:  Negative for adenopathy.  Psychiatric/Behavioral:  Negative for behavioral problems, dysphoric mood and sleep disturbance.    Per HPI unless specifically indicated above     Objective:    BP 136/78 (BP Location: Left Arm, Cuff Size: Normal)   Pulse 85   Ht 5' 10 (1.778 m)   Wt 167 lb 4 oz (75.9 kg)   SpO2 100%   BMI 24.00 kg/m   Wt Readings from Last 3 Encounters:  03/23/24 167 lb 4 oz (75.9 kg)  02/18/24 167 lb (75.8 kg)  01/30/24 165 lb (74.8 kg)    Physical Exam Vitals and nursing note reviewed.  Constitutional:      General: He is not in acute distress.    Appearance: He is well-developed. He is not diaphoretic.     Comments: Well-appearing, comfortable, cooperative  HENT:     Head: Normocephalic and atraumatic.  Eyes:     General:        Right eye: No discharge.        Left eye: No discharge.     Conjunctiva/sclera: Conjunctivae normal.     Pupils: Pupils are equal, round, and reactive to light.  Neck:     Thyroid : No thyromegaly.     Vascular: No carotid bruit.   Cardiovascular:     Rate and Rhythm: Normal rate and regular rhythm.     Pulses: Normal pulses.     Heart sounds: Normal heart sounds. No murmur heard. Pulmonary:     Effort: Pulmonary effort is normal. No respiratory distress.     Breath sounds: Normal breath sounds. No wheezing or rales.  Abdominal:     General: Bowel sounds are normal. There is no distension.     Palpations: Abdomen is soft. There is no mass.     Tenderness: There is no abdominal tenderness.  Musculoskeletal:        General: No tenderness. Normal range of motion.     Cervical back: Normal range of motion and neck supple.     Right lower leg: No edema.     Left lower leg: No edema.     Comments: Upper / Lower Extremities: - Normal muscle tone, strength bilateral upper extremities 5/5, lower extremities 5/5  Lymphadenopathy:     Cervical: No cervical adenopathy.  Skin:    General: Skin is warm and dry.     Findings: No erythema or rash.  Neurological:     Mental Status: He is alert and oriented to person, place, and time.     Comments: Distal sensation intact to  light touch all extremities  Psychiatric:        Mood and Affect: Mood normal.        Behavior: Behavior normal.        Thought Content: Thought content normal.     Comments: Well groomed, good eye contact, normal speech and thoughts     Results for orders placed or performed in visit on 03/02/24  Vitamin B12   Collection Time: 03/02/24  8:23 AM  Result Value Ref Range   Vitamin B-12 624 200 - 1,100 pg/mL  Comprehensive metabolic panel with GFR   Collection Time: 03/02/24  8:23 AM  Result Value Ref Range   Glucose, Bld 202 (H) 65 - 99 mg/dL   BUN 25 7 - 25 mg/dL   Creat 8.81 9.29 - 8.77 mg/dL   eGFR 60 > OR = 60 fO/fpw/8.26f7   BUN/Creatinine Ratio SEE NOTE: 6 - 22 (calc)   Sodium 135 135 - 146 mmol/L   Potassium 4.8 3.5 - 5.3 mmol/L   Chloride 98 98 - 110 mmol/L   CO2 28 20 - 32 mmol/L   Calcium  9.8 8.6 - 10.3 mg/dL   Total Protein 7.2  6.1 - 8.1 g/dL   Albumin 4.5 3.6 - 5.1 g/dL   Globulin 2.7 1.9 - 3.7 g/dL (calc)   AG Ratio 1.7 1.0 - 2.5 (calc)   Total Bilirubin 0.7 0.2 - 1.2 mg/dL   Alkaline phosphatase (APISO) 49 35 - 144 U/L   AST 20 10 - 35 U/L   ALT 16 9 - 46 U/L  TSH   Collection Time: 03/02/24  8:23 AM  Result Value Ref Range   TSH 1.40 0.40 - 4.50 mIU/L  Microalbumin / creatinine urine ratio   Collection Time: 03/02/24  8:23 AM  Result Value Ref Range   Creatinine, Urine 77 20 - 320 mg/dL   Microalb, Ur 0.8 mg/dL   Microalb Creat Ratio 10 <30 mg/g creat  PSA   Collection Time: 03/02/24  8:23 AM  Result Value Ref Range   PSA 0.17 < OR = 4.00 ng/mL  Hemoglobin A1c   Collection Time: 03/02/24  8:23 AM  Result Value Ref Range   Hgb A1c MFr Bld 9.1 (H) <5.7 %   Mean Plasma Glucose 214 mg/dL   eAG (mmol/L) 88.0 mmol/L  CBC with Differential/Platelet   Collection Time: 03/02/24  8:23 AM  Result Value Ref Range   WBC 9.9 3.8 - 10.8 Thousand/uL   RBC 5.78 4.20 - 5.80 Million/uL   Hemoglobin 16.9 13.2 - 17.1 g/dL   HCT 49.4 (H) 61.4 - 49.9 %   MCV 87.4 80.0 - 100.0 fL   MCH 29.2 27.0 - 33.0 pg   MCHC 33.5 32.0 - 36.0 g/dL   RDW 86.6 88.9 - 84.9 %   Platelets 132 (L) 140 - 400 Thousand/uL   MPV 10.6 7.5 - 12.5 fL   Neutro Abs 5,752 1,500 - 7,800 cells/uL   Absolute Lymphocytes 3,109 850 - 3,900 cells/uL   Absolute Monocytes 861 200 - 950 cells/uL   Eosinophils Absolute 119 15 - 500 cells/uL   Basophils Absolute 59 0 - 200 cells/uL   Neutrophils Relative % 58.1 %   Total Lymphocyte 31.4 %   Monocytes Relative 8.7 %   Eosinophils Relative 1.2 %   Basophils Relative 0.6 %  Lipid panel   Collection Time: 03/02/24  8:23 AM  Result Value Ref Range   Cholesterol 96 <200 mg/dL   HDL 34 (L) >  OR = 40 mg/dL   Triglycerides 759 (H) <150 mg/dL   LDL Cholesterol (Calc) 32 mg/dL (calc)   Total CHOL/HDL Ratio 2.8 <5.0 (calc)   Non-HDL Cholesterol (Calc) 62 <869 mg/dL (calc)      Assessment & Plan:    Problem List Items Addressed This Visit     Back pain   Benign hypertension with CKD (chronic kidney disease) stage III (HCC)   Relevant Medications   ezetimibe  (ZETIA ) 10 MG tablet   Drug-induced myopathy   Essential hypertension   Relevant Medications   ezetimibe  (ZETIA ) 10 MG tablet   Hyperlipidemia associated with type 2 diabetes mellitus (HCC)   Relevant Medications   ezetimibe  (ZETIA ) 10 MG tablet   metFORMIN  (GLUCOPHAGE -XR) 500 MG 24 hr tablet   Type 2 diabetes mellitus with diabetic polyneuropathy (HCC)   Relevant Medications   metFORMIN  (GLUCOPHAGE -XR) 500 MG 24 hr tablet   Other Visit Diagnoses       Annual physical exam    -  Primary     Coronary artery disease of native artery of native heart with stable angina pectoris       Relevant Medications   ezetimibe  (ZETIA ) 10 MG tablet     Long term current use of oral hypoglycemic drug         Vitamin B12 nutritional deficiency         Needs flu shot       Relevant Orders   Flu vaccine HIGH DOSE PF(Fluzone Trivalent) (Completed)     Need for Streptococcus pneumoniae vaccination       Relevant Orders   Pneumococcal conjugate vaccine 20-valent (Completed)        Updated Health Maintenance information Reviewed recent lab results with patient Encouraged improvement to lifestyle with diet and exercise  Adult Wellness Visit Annual wellness visit conducted. No major issues identified except for elevated A1c levels. - Administer flu shot. - Administer Prevnar 20 pneumonia vaccine.  Type 2 diabetes mellitus A1c increased to 9.1 from 7.5, indicating poor glycemic control. Recent dietary indiscretions and steroid use may have contributed. Currently on metformin . Patient prefers dietary changes before medication adjustment. - Encourage dietary modifications to reduce sugar intake. - Continue metformin  - Re-evaluate A1c in 4 months.  Hyperlipidemia LDL cholesterol improved from 130 to 30 due to adherence to  rosuvastatin  and Zetia . No side effects reported. - Continue current medications (rosuvastatin  and Zetia ).  Chronic cough with postnasal drip Chronic cough with clear phlegm and postnasal drip, likely due to recent viral infection or allergies. Discussed use of Flonase  for longer-term management if needed. - Continue Mucinex  D as needed. - Use Atrovent  nasal spray as needed. - Consider Flonase  for long-term management if symptoms persist. Has nasal sprays, we can re order if he checks and is out or running low  Hypertension Blood pressure slightly elevated above 140. Currently on metoprolol . - Continue current medication regimen.  General Health Maintenance Discussed vaccinations and general health screenings. Encouraged to receive COVID booster at pharmacy. Patient agreed to receive flu and pneumonia vaccines today. - Encourage COVID booster at pharmacy.        Orders Placed This Encounter  Procedures   Pneumococcal conjugate vaccine 20-valent   Flu vaccine HIGH DOSE PF(Fluzone Trivalent)    Meds ordered this encounter  Medications   ezetimibe  (ZETIA ) 10 MG tablet    Sig: Take 1 tablet (10 mg total) by mouth daily.    Dispense:  90 tablet    Refill:  3  Add refills   metFORMIN  (GLUCOPHAGE -XR) 500 MG 24 hr tablet    Sig: Take 1 tablet (500 mg total) by mouth daily with breakfast.    Dispense:  90 tablet    Refill:  3    Add future refills     Follow up plan: Return in about 4 months (around 07/24/2024) for 4 month DM A1c.  Marsa Officer, DO [facility] 03/23/2024, 8:52 AM

## 2024-03-23 NOTE — Patient Instructions (Addendum)
 Thank you for coming to the office today.  For the nasal drainage drip Start Atrovent  nasal spray decongestant 2 sprays in each nostril up to 4 times daily as needed to dry up.  Also if you have the Flonase  Fluticasone  this is the steroid nasal spray can use this longer term if you prefer, this only works if every day for several.  Flu Shot + Prevnar 20 pneumonia vaccine today  COVID Booster at the pharmacy  Recent Labs    08/28/23 1054 03/02/24 0823  HGBA1C 7.5* 9.1*   Keep on Metformin . We discussed goal to consider increasing or scaling back on sugar in diet.  Try to reduce sweets and breads and see if this will improve  Okay to take Lyrica  100mg  twice a day per Neurology for neuropathy when you finish the 75mg .   Please schedule a Follow-up Appointment to: Return in about 4 months (around 07/24/2024) for 4 month DM A1c.  If you have any other questions or concerns, please feel free to call the office or send a message through MyChart. You may also schedule an earlier appointment if necessary.  Additionally, you may be receiving a survey about your experience at our office within a few days to 1 week by e-mail or mail. We value your feedback.  Marsa Officer, DO [facility], [place]

## 2024-03-24 DIAGNOSIS — H26491 Other secondary cataract, right eye: Secondary | ICD-10-CM | POA: Diagnosis not present

## 2024-03-31 DIAGNOSIS — K5900 Constipation, unspecified: Secondary | ICD-10-CM | POA: Diagnosis not present

## 2024-03-31 DIAGNOSIS — G8929 Other chronic pain: Secondary | ICD-10-CM | POA: Diagnosis not present

## 2024-04-01 ENCOUNTER — Telehealth: Payer: Self-pay

## 2024-04-01 NOTE — Telephone Encounter (Signed)
 Copied from CRM #[id number]. Topic: Clinical - Medical Advice >> Mar 31, 2024  4:33 PM Everette C wrote: Reason for CRM: Carly with [facility] has called to request contact with a member of clinical staff to discuss the patient's cardiac concerns and recent developments. The patient has been seen by their cardiologist recently and Carly would like to share updates  Carly saw the patient today 03/31/24 Please contact [phone] further when possible

## 2024-04-01 NOTE — Telephone Encounter (Signed)
 Attempted to call, could not reach [name]. Please get more information when she returns call

## 2024-04-06 ENCOUNTER — Other Ambulatory Visit: Payer: Self-pay

## 2024-04-06 DIAGNOSIS — R001 Bradycardia, unspecified: Secondary | ICD-10-CM

## 2024-04-06 DIAGNOSIS — E1142 Type 2 diabetes mellitus with diabetic polyneuropathy: Secondary | ICD-10-CM

## 2024-04-26 ENCOUNTER — Other Ambulatory Visit: Payer: Self-pay

## 2024-04-26 DIAGNOSIS — R001 Bradycardia, unspecified: Secondary | ICD-10-CM

## 2024-05-14 ENCOUNTER — Ambulatory Visit

## 2024-05-14 DIAGNOSIS — Z0000 Encounter for general adult medical examination without abnormal findings: Secondary | ICD-10-CM

## 2024-05-14 NOTE — Progress Notes (Signed)
 "  Chief Complaint  Patient presents with   Medicare Wellness     Subjective:   [name] is a 89 y.o. male who presents for a Medicare Annual Wellness Visit.  Visit info / Clinical Intake: Medicare Wellness Visit Type:: Subsequent Annual Wellness Visit Persons participating in visit and providing information:: patient Medicare Wellness Visit Mode:: Telephone If telephone:: video declined Since this visit was completed virtually, some vitals may be partially provided or unavailable. Missing vitals are due to the limitations of the virtual format.: Unable to obtain vitals - no equipment If Telephone or Video please confirm:: I connected with patient using audio/video enable telemedicine. I verified patient identity with two identifiers, discussed telehealth limitations, and patient agreed to proceed. Patient Location:: HOME Provider Location:: OFFICE Interpreter Needed?: No Pre-visit prep was completed: yes AWV questionnaire completed by patient prior to visit?: no Living arrangements:: (!) lives alone Patient's Overall Health Status Rating: good Typical amount of pain: none Does pain affect daily life?: no Are you currently prescribed opioids?: no  Dietary Habits and Nutritional Risks How many meals a day?: 3 Eats fruit and vegetables daily?: yes Most meals are obtained by: preparing own meals (EATS OUT AT DINNER) In the last 2 weeks, have you had any of the following?: none Diabetic:: (!) yes Any non-healing wounds?: no How often do you check your BS?: as needed Would you like to be referred to a Nutritionist or for Diabetic Management? : no  Functional Status Activities of Daily Living (to include ambulation/medication): Independent Ambulation: Independent Medication Administration: Independent Home Management (perform basic housework or laundry): Independent Manage your own finances?: yes Primary transportation is: driving Concerns about vision?: no *vision  screening is required for [facility]* (WEARS GLASSES- DR.DINGELDEIN) Concerns about hearing?: (!) yes Uses hearing aids?: (!) yes (BOTH EARS) Hear whispered voice?: (!) no *in-person visit only*  Fall Screening Falls in the past year?: 0 Number of falls in past year: 0 Was there an injury with Fall?: 0 Fall Risk Category Calculator: 0 Patient Fall Risk Level: Low Fall Risk  Fall Risk Patient at Risk for Falls Due to: Other (Comment) (FEET ARE NUMB) Fall risk Follow up: Falls evaluation completed; Falls prevention discussed  Home and Transportation Safety: All rugs have non-skid backing?: yes All stairs or steps have railings?: N/A, no stairs Grab bars in the bathtub or shower?: (!) no Have non-skid surface in bathtub or shower?: yes Good home lighting?: yes Regular seat belt use?: yes Hospital stays in the last year:: (!) yes How many hospital stays:: 1 Reason: MI, STENT PLACEMENT  Cognitive Assessment Difficulty concentrating, remembering, or making decisions? : no Will 6CIT or Mini Cog be Completed: yes What year is it?: 0 points What month is it?: 0 points Give patient an address phrase to remember (5 components): [address] [place], [place] About what time is it?: 0 points Count backwards from 20 to 1: 0 points Say the months of the year in reverse: 0 points Repeat the address phrase from earlier: 0 points 6 CIT Score: 0 points  Advance Directives (For Healthcare) Does Patient Have a Medical Advance Directive?: No Would patient like information on creating a medical advance directive?: No - Patient declined  Reviewed/Updated  Reviewed/Updated: Reviewed All (Medical, Surgical, Family, Medications, Allergies, Care Teams, Patient Goals)    Allergies (verified) Patient has no known allergies.   Current Medications (verified) Outpatient Encounter Medications as of 05/14/2024  Medication Sig   clopidogrel (PLAVIX) 75 MG tablet Take  75 mg by mouth at bedtime.    colchicine  0.6 MG tablet If acute flare only - Day 1: take 2 pills first day, then repeat 1 pill within two hours. Day 2 - take 1 pill daily for up to max 7 days or stop when gout flare resolves.   diclofenac  Sodium (VOLTAREN ) 1 % GEL Apply 2 g topically 4 (four) times daily as needed (elbow bursitis).   ezetimibe  (ZETIA ) 10 MG tablet Take 1 tablet (10 mg total) by mouth daily.   fluticasone  (FLONASE ) 50 MCG/ACT nasal spray Place 2 sprays into both nostrils daily. Use for 4-6 weeks then stop and use seasonally or as needed.   ipratropium (ATROVENT ) 0.06 % nasal spray Place 2 sprays into both nostrils 4 (four) times daily. For up to 5-7 days then stop.   metFORMIN  (GLUCOPHAGE -XR) 500 MG 24 hr tablet Take 1 tablet (500 mg total) by mouth daily with breakfast.   metoprolol  succinate (TOPROL -XL) 25 MG 24 hr tablet Take 1 tablet (25 mg total) by mouth daily. Take with or immediately following a meal.   montelukast  (SINGULAIR ) 10 MG tablet Take 1 tablet (10 mg total) by mouth at bedtime.   polyethylene glycol powder (GLYCOLAX /MIRALAX ) 17 GM/SCOOP powder Take 17-34 g by mouth daily as needed for moderate constipation or mild constipation.   pregabalin  (LYRICA ) 100 MG capsule Take 100 mg by mouth 2 (two) times daily. (Patient taking differently: Take 100 mg by mouth 2 (two) times daily. TAKES 1 IN A.M., 2 IN P.M.)   rosuvastatin  (CRESTOR ) 40 MG tablet Take 1 tablet (40 mg total) by mouth daily.   ticagrelor  (BRILINTA ) 90 MG TABS tablet Take 1 tablet (90 mg total) by mouth 2 (two) times daily.   tiZANidine  (ZANAFLEX ) 4 MG tablet Take 0.5 tablets (2 mg total) by mouth every 6 (six) hours as needed for muscle spasms.   aspirin  81 MG chewable tablet Chew 1 tablet (81 mg total) by mouth daily. (Patient not taking: Reported on 05/14/2024)   No facility-administered encounter medications on file as of 05/14/2024.    History: Past Medical History:  Diagnosis Date   CAD (coronary artery disease)    Coronary  artery disease of native artery of native heart with stable angina pectoris    Diabetes mellitus without complication (HCC)    Grade I diastolic dysfunction    Hyperlipidemia    Hypertension    Neuropathy    Neuropathy    feet   Prostate cancer (HCC)    STEMI (ST elevation myocardial infarction) (HCC) 04/2023   Wears dentures    partial upper and lower   Wears hearing aid in both ears    Past Surgical History:  Procedure Laterality Date   CATARACT EXTRACTION W/PHACO Left 04/24/2015   Procedure: CATARACT EXTRACTION PHACO AND INTRAOCULAR LENS PLACEMENT (IOC);  Surgeon: Steven Dingeldein, MD;  Location: [facility] OR[facility];  Service: Ophthalmology;  Laterality: Left;  US  01:12 AP% 25.7 CDE 28.30 fluid pack lot #[id number]   CATARACT EXTRACTION W/PHACO Right 07/29/2023   Procedure: CATARACT EXTRACTION PHACO AND INTRAOCULAR LENS PLACEMENT (IOC) RIGHT 10.04 01:04.1;  Surgeon: Jaye Fallow, MD;  Location: [facility];  Service: Ophthalmology;  Laterality: Right;   COLONOSCOPY     CORONARY/GRAFT ACUTE MI REVASCULARIZATION N/A 05/10/2023   Procedure: Coronary/Graft Acute MI Revascularization;  Surgeon: Florencio Cara BIRCH, MD;  Location: [facility];  Service: Cardiovascular;  Laterality: N/A;   LEFT HEART CATH AND CORONARY ANGIOGRAPHY N/A 05/10/2023   Procedure: LEFT HEART CATH  AND CORONARY ANGIOGRAPHY;  Surgeon: Florencio Cara BIRCH, MD;  Location: [facility] INVAS[facility] CV [facility];  Service: Cardiovascular;  Laterality: N/A;   Family History  Problem Relation Age of Onset   Stroke Mother    Cancer Mother        colon cancer   COPD Father    COPD Brother    Hearing loss Son    Social History   Occupational History   Not on file  Tobacco Use   Smoking status: Former    Current packs/day: 0.00    Types: Cigarettes    Quit date: 05/27/1978    Years since quitting: 45.9   Smokeless tobacco: Never  Vaping Use   Vaping status: Never Used  Substance and Sexual Activity   Alcohol use: No     Alcohol/week: 0.0 standard drinks of alcohol   Drug use: No   Sexual activity: Not on file   Tobacco Counseling Counseling given: Not Answered  SDOH Screenings   Food Insecurity: No Food Insecurity (03/03/2024)   Received from [facility]  Housing: Low Risk  (03/03/2024)   Received from [facility]  Transportation Needs: No Transportation Needs (03/03/2024)   Received from [facility]  Utilities: Not At Risk (03/03/2024)   Received from [facility]  Alcohol Screen: Low Risk (05/09/2023)  Depression (PHQ2-9): Low Risk (05/14/2024)  Financial Resource Strain: Low Risk  (03/03/2024)   Received from [facility]  Physical Activity: Insufficiently Active (05/14/2024)  Social Connections: Socially Integrated (05/09/2023)  Stress: No Stress Concern Present (05/14/2024)  Tobacco Use: Medium Risk (05/14/2024)  Health Literacy: Adequate Health Literacy (05/09/2023)   See flowsheets for full screening details  Depression Screen PHQ 2 & 9 Depression Scale- Over the past 2 weeks, how often have you been bothered by any of the following problems? Little interest or pleasure in doing things: 0 Feeling down, depressed, or hopeless (PHQ Adolescent also includes...irritable): 0 PHQ-2 Total Score: 0 Trouble falling or staying asleep, or sleeping too much: 0 Feeling tired or having little energy: 0 Poor appetite or overeating (PHQ Adolescent also includes...weight loss): 0 Feeling bad about yourself - or that you are a failure or have let yourself or your family down: 0 Trouble concentrating on things, such as reading the newspaper or watching television (PHQ Adolescent also includes...like school work): 0 Moving or speaking so slowly that other people could have noticed. Or the opposite - being so fidgety or restless that you have been moving around a lot more than usual: 0 Thoughts that you would be better off  dead, or of hurting yourself in some way: 0 PHQ-9 Total Score: 0 If you checked off any problems, how difficult have these problems made it for you to do your work, take care of things at home, or get along with other people?: Not difficult at all  Depression Treatment Depression Interventions/Treatment : PHQ2-9 Score <4 Follow-up Not Indicated     Goals Addressed             This Visit's Progress    DIET - REDUCE SALT INTAKE TO 2 GRAMS PER DAY OR LESS               Objective:    There were no vitals filed for this visit. There is no height or weight on file to calculate BMI.  Hearing/Vision screen Hearing Screening - Comments:: WEARS AIDS, BOTH EARS Vision Screening - Comments:: WEARS GLASSES- [facility]  Immunizations and Health Maintenance Health Maintenance  Topic Date Due   DTaP/Tdap/Td (1 - Tdap) Never done   COVID-19 Vaccine (3 - 2025-26 season) 01/26/2024   Zoster Vaccines- Shingrix (1 of 2) 06/23/2024 (Originally [birth year]1986)   OPHTHALMOLOGY EXAM  07/06/2024   FOOT EXAM  07/30/2024   HEMOGLOBIN A1C  08/31/2024   Medicare Annual Wellness (AWV)  05/14/2025   Pneumococcal Vaccine: 50+ Years  Completed   Influenza Vaccine  Completed   Meningococcal B Vaccine  Aged Out        Assessment/Plan:  This is a routine wellness examination for [name].  Patient Care Team: Edman Marsa PARAS, DO as PCP - General (Family Medicine) [facility], [facility])  I have personally reviewed and noted the following in the patients chart:   Medical and social history Use of alcohol, tobacco or illicit drugs  Current medications and supplements including opioid prescriptions. Functional ability and status Nutritional status Physical activity Advanced directives List of other physicians Hospitalizations, surgeries, and ER visits in previous 12 months Vitals Screenings to include cognitive, depression, and falls Referrals and appointments  No orders of  the defined types were placed in this encounter.  In addition, I have reviewed and discussed with patient certain preventive protocols, quality metrics, and best practice recommendations. A written personalized care plan for preventive services as well as general preventive health recommendations were provided to patient.   Jhonnie GORMAN Das, LPN   [id number]   Return in 1 year (on 05/14/2025).  After Visit Summary: (MyChart) Due to this being a telephonic visit, the after visit summary with patients personalized plan was offered to patient via MyChart   Nurse Notes: UTD ON FLU & PNA; NEEDS SHINGRIX, TDAP; AGED OUT OF COLONOSCOPY   "

## 2024-05-14 NOTE — Patient Instructions (Addendum)
 Mr. Stutsman,  Thank you for taking the time for your Medicare Wellness Visit. I appreciate your continued commitment to your health goals. Please review the care plan we discussed, and feel free to reach out if I can assist you further.  Please note that Annual Wellness Visits do not include a physical exam. Some assessments may be limited, especially if the visit was conducted virtually. If needed, we may recommend an in-person follow-up with your provider.  Ongoing Care Seeing your primary care provider every 3 to 6 months helps us  monitor your health and provide consistent, personalized care.   Referrals If a referral was made during today's visit and you haven't received any updates within two weeks, please contact the referred provider directly to check on the status.  Recommended Screenings:  Health Maintenance  Topic Date Due   DTaP/Tdap/Td vaccine (1 - Tdap) Never done   COVID-19 Vaccine (3 - 2025-26 season) 01/26/2024   Zoster (Shingles) Vaccine (1 of 2) 06/23/2024*   Eye exam for diabetics  07/06/2024   Complete foot exam   07/30/2024   Hemoglobin A1C  08/31/2024   Medicare Annual Wellness Visit  05/14/2025   Pneumococcal Vaccine for age over 33  Completed   Flu Shot  Completed   Meningitis B Vaccine  Aged Out  *Topic was postponed. The date shown is not the original due date.     Vision: Annual vision screenings are recommended for early detection of glaucoma, cataracts, and diabetic retinopathy. These exams can also reveal signs of chronic conditions such as diabetes and high blood pressure.  Dental: Annual dental screenings help detect early signs of oral cancer, gum disease, and other conditions linked to overall health, including heart disease and diabetes.  Please see the attached documents for additional preventive care recommendations.   NEXT AWV 05/18/25 @ 9:30 AM IN PERSON

## 2024-05-17 ENCOUNTER — Ambulatory Visit: Admitting: Podiatry

## 2024-05-17 DIAGNOSIS — Z91199 Patient's noncompliance with other medical treatment and regimen due to unspecified reason: Secondary | ICD-10-CM

## 2024-05-17 NOTE — Progress Notes (Signed)
 1. No-show for appointment

## 2024-05-18 ENCOUNTER — Ambulatory Visit: Admitting: Podiatry

## 2024-05-18 DIAGNOSIS — B351 Tinea unguium: Secondary | ICD-10-CM

## 2024-05-18 DIAGNOSIS — M79675 Pain in left toe(s): Secondary | ICD-10-CM

## 2024-05-18 DIAGNOSIS — M79674 Pain in right toe(s): Secondary | ICD-10-CM | POA: Diagnosis not present

## 2024-05-18 NOTE — Progress Notes (Signed)
"  °  Subjective:  Patient ID: [name], male    DOB: 01/19/1934,  MRN: [id number]  89 y.o. male presents at risk foot care with history of diabetic neuropathy and painful thick toenails that are difficult to trim. Pain interferes with ambulation. Aggravating factors include wearing enclosed shoe gear. Pain is relieved with periodic professional debridement. Patient states his doctor has increased the dosage of his Lyrica  to 100 mg bid for painful neuropathy. He states the Nervive Cream did not work for him. Chief Complaint  Patient presents with   Nail Problem    New problem(s): None   PCP is Edman Marsa PARAS, DO.  No Known Allergies  Review of Systems: Negative except as noted in the HPI.   Objective:  [name] is a pleasant 89 y.o. male WD, WN in NAD. AAO x 3.  Vascular Examination: Vascular status intact b/l with palpable pedal pulses. CFT immediate b/l. Pedal hair present. No edema. No pain with calf compression b/l. Skin temperature gradient WNL b/l. No varicosities noted. No cyanosis or clubbing noted.  Neurological Examination: Pt has subjective symptoms of neuropathy. Sensation grossly intact b/l with 10 gram monofilament.   Dermatological Examination: Pedal skin with normal turgor, texture and tone b/l. No open wounds nor interdigital macerations noted. Toenails 1-5 b/l thick, discolored, elongated with subungual debris and pain on dorsal palpation. No corns, calluses nor porokeratotic lesions noted.  Musculoskeletal Examination: Muscle strength 5/5 to all LE muscle groups of BLE. Palpable exostosis noted 1st met cuneiform joint of both feet. No pain, crepitus or joint limitation noted with ROM bilateral LE.  Radiographs: None  Last A1c:      Latest Ref Rng & Units 03/02/2024    8:23 AM 08/28/2023   10:54 AM  Hemoglobin A1C  Hemoglobin-A1c <5.7 % 9.1  7.5    Assessment:   No diagnosis found.  Plan:  Patient was evaluated and treated. All  patient's and/or POA's questions/concerns addressed on today's visit. 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  Continue daily foot inspections and monitor blood glucose per PCP/Endocrinologist's recommendations.Continue soft, supportive shoe gear daily. Report any pedal injuries to medical professional. Call office if there are any quesitons/concerns. -Patient/POA to call should there be question/concern in the interim.  No follow-ups on file.  Franky SHAUNNA Blanch, DPM      [facility] LOCATION: [address] [place] [address]                   Office [phone]   [facility] LOCATION: [address] [place], [place] [address] Office [phone] "

## 2024-07-26 ENCOUNTER — Ambulatory Visit: Admitting: Family Medicine

## 2024-08-16 ENCOUNTER — Ambulatory Visit: Admitting: Podiatry

## 2025-05-18 ENCOUNTER — Ambulatory Visit
